# Patient Record
Sex: Female | Born: 1945 | Race: White | Hispanic: No | Marital: Married | Smoking: Former smoker
Health system: Southern US, Community
[De-identification: ages and names within clinical notes are randomized; demographics above are authoritative.]

## PROBLEM LIST (undated history)

## (undated) ENCOUNTER — Emergency Department (HOSPITAL_COMMUNITY): Discharge status: Absent without leave | Payer: PPO

## (undated) DIAGNOSIS — F0390 Unspecified dementia without behavioral disturbance: Secondary | ICD-10-CM

## (undated) DIAGNOSIS — K635 Polyp of colon: Secondary | ICD-10-CM

## (undated) DIAGNOSIS — F32A Depression, unspecified: Secondary | ICD-10-CM

## (undated) DIAGNOSIS — R7301 Impaired fasting glucose: Secondary | ICD-10-CM

## (undated) DIAGNOSIS — I10 Essential (primary) hypertension: Secondary | ICD-10-CM

## (undated) DIAGNOSIS — F329 Major depressive disorder, single episode, unspecified: Secondary | ICD-10-CM

## (undated) DIAGNOSIS — F419 Anxiety disorder, unspecified: Secondary | ICD-10-CM

## (undated) DIAGNOSIS — J449 Chronic obstructive pulmonary disease, unspecified: Secondary | ICD-10-CM

## (undated) DIAGNOSIS — R413 Other amnesia: Secondary | ICD-10-CM

## (undated) DIAGNOSIS — G4730 Sleep apnea, unspecified: Secondary | ICD-10-CM

## (undated) DIAGNOSIS — E785 Hyperlipidemia, unspecified: Secondary | ICD-10-CM

## (undated) DIAGNOSIS — K5790 Diverticulosis of intestine, part unspecified, without perforation or abscess without bleeding: Secondary | ICD-10-CM

## (undated) DIAGNOSIS — J45909 Unspecified asthma, uncomplicated: Secondary | ICD-10-CM

## (undated) HISTORY — PX: ABDOMINAL HYSTERECTOMY: SHX81

## (undated) HISTORY — DX: Chronic obstructive pulmonary disease, unspecified: J44.9

## (undated) HISTORY — DX: Polyp of colon: K63.5

## (undated) HISTORY — DX: Sleep apnea, unspecified: G47.30

## (undated) HISTORY — DX: Diverticulosis of intestine, part unspecified, without perforation or abscess without bleeding: K57.90

## (undated) HISTORY — DX: Unspecified asthma, uncomplicated: J45.909

## (undated) HISTORY — PX: ABDOMINAL SURGERY: SHX537

## (undated) HISTORY — PX: APPENDECTOMY: SHX54

## (undated) HISTORY — PX: BACK SURGERY: SHX140

## (undated) HISTORY — PX: NECK SURGERY: SHX720

## (undated) HISTORY — PX: TONSILLECTOMY: SUR1361

## (undated) HISTORY — DX: Hyperlipidemia, unspecified: E78.5

## (undated) HISTORY — DX: Impaired fasting glucose: R73.01

## (undated) HISTORY — PX: SIGMOIDECTOMY: SHX176

## (undated) HISTORY — DX: Other amnesia: R41.3

---

## 1998-04-11 ENCOUNTER — Encounter: Payer: Self-pay | Admitting: Neurosurgery

## 1998-04-11 ENCOUNTER — Ambulatory Visit (HOSPITAL_COMMUNITY): Admission: RE | Admit: 1998-04-11 | Discharge: 1998-04-11 | Payer: Self-pay | Admitting: Neurosurgery

## 1998-04-25 ENCOUNTER — Ambulatory Visit (HOSPITAL_COMMUNITY): Admission: RE | Admit: 1998-04-25 | Discharge: 1998-04-25 | Payer: Self-pay | Admitting: Neurosurgery

## 1998-04-25 ENCOUNTER — Encounter: Payer: Self-pay | Admitting: Neurosurgery

## 1998-05-09 ENCOUNTER — Ambulatory Visit (HOSPITAL_COMMUNITY): Admission: RE | Admit: 1998-05-09 | Discharge: 1998-05-09 | Payer: Self-pay | Admitting: Neurosurgery

## 1998-05-09 ENCOUNTER — Encounter: Payer: Self-pay | Admitting: Neurosurgery

## 1998-06-14 ENCOUNTER — Encounter: Payer: Self-pay | Admitting: Neurosurgery

## 1998-06-18 ENCOUNTER — Inpatient Hospital Stay (HOSPITAL_COMMUNITY): Admission: RE | Admit: 1998-06-18 | Discharge: 1998-06-21 | Payer: Self-pay | Admitting: Neurosurgery

## 1998-06-18 ENCOUNTER — Encounter: Payer: Self-pay | Admitting: Neurosurgery

## 1998-11-08 ENCOUNTER — Ambulatory Visit (HOSPITAL_COMMUNITY): Admission: RE | Admit: 1998-11-08 | Discharge: 1998-11-08 | Payer: Self-pay | Admitting: Neurosurgery

## 1998-11-08 ENCOUNTER — Encounter: Payer: Self-pay | Admitting: Neurosurgery

## 1998-11-11 ENCOUNTER — Ambulatory Visit (HOSPITAL_COMMUNITY): Admission: RE | Admit: 1998-11-11 | Discharge: 1998-11-11 | Payer: Self-pay | Admitting: Neurosurgery

## 1998-11-11 ENCOUNTER — Encounter: Payer: Self-pay | Admitting: Neurosurgery

## 1998-11-22 ENCOUNTER — Encounter: Payer: Self-pay | Admitting: Neurosurgery

## 1998-11-22 ENCOUNTER — Inpatient Hospital Stay (HOSPITAL_COMMUNITY): Admission: RE | Admit: 1998-11-22 | Discharge: 1998-11-25 | Payer: Self-pay | Admitting: Neurosurgery

## 2000-03-07 ENCOUNTER — Ambulatory Visit (HOSPITAL_COMMUNITY): Admission: RE | Admit: 2000-03-07 | Discharge: 2000-03-07 | Payer: Self-pay | Admitting: Neurosurgery

## 2000-03-07 ENCOUNTER — Encounter: Payer: Self-pay | Admitting: Neurosurgery

## 2000-03-31 ENCOUNTER — Encounter: Payer: Self-pay | Admitting: Neurosurgery

## 2000-04-02 ENCOUNTER — Inpatient Hospital Stay (HOSPITAL_COMMUNITY): Admission: RE | Admit: 2000-04-02 | Discharge: 2000-04-06 | Payer: Self-pay | Admitting: Neurosurgery

## 2000-04-02 ENCOUNTER — Encounter: Payer: Self-pay | Admitting: Neurosurgery

## 2000-10-18 ENCOUNTER — Encounter: Payer: Self-pay | Admitting: Otolaryngology

## 2000-10-18 ENCOUNTER — Encounter: Admission: RE | Admit: 2000-10-18 | Discharge: 2000-10-18 | Payer: Self-pay | Admitting: Otolaryngology

## 2000-10-26 ENCOUNTER — Encounter (INDEPENDENT_AMBULATORY_CARE_PROVIDER_SITE_OTHER): Payer: Self-pay

## 2000-10-26 ENCOUNTER — Other Ambulatory Visit: Admission: RE | Admit: 2000-10-26 | Discharge: 2000-10-26 | Payer: Self-pay | Admitting: Otolaryngology

## 2001-04-25 ENCOUNTER — Encounter: Payer: Self-pay | Admitting: Family Medicine

## 2001-04-25 ENCOUNTER — Ambulatory Visit (HOSPITAL_COMMUNITY): Admission: RE | Admit: 2001-04-25 | Discharge: 2001-04-25 | Payer: Self-pay | Admitting: Family Medicine

## 2001-04-29 ENCOUNTER — Ambulatory Visit (HOSPITAL_COMMUNITY): Admission: RE | Admit: 2001-04-29 | Discharge: 2001-04-29 | Payer: Self-pay | Admitting: Neurosurgery

## 2001-04-29 ENCOUNTER — Encounter: Payer: Self-pay | Admitting: Neurosurgery

## 2001-09-07 ENCOUNTER — Ambulatory Visit (HOSPITAL_COMMUNITY): Admission: RE | Admit: 2001-09-07 | Discharge: 2001-09-07 | Payer: Self-pay | Admitting: Family Medicine

## 2001-09-07 ENCOUNTER — Encounter: Payer: Self-pay | Admitting: Family Medicine

## 2001-09-20 ENCOUNTER — Encounter: Payer: Self-pay | Admitting: Family Medicine

## 2001-09-20 ENCOUNTER — Ambulatory Visit (HOSPITAL_COMMUNITY): Admission: RE | Admit: 2001-09-20 | Discharge: 2001-09-20 | Payer: Self-pay | Admitting: Family Medicine

## 2002-02-09 ENCOUNTER — Ambulatory Visit (HOSPITAL_COMMUNITY): Admission: RE | Admit: 2002-02-09 | Discharge: 2002-02-09 | Payer: Self-pay | Admitting: Family Medicine

## 2002-02-09 ENCOUNTER — Encounter: Payer: Self-pay | Admitting: Family Medicine

## 2002-03-06 ENCOUNTER — Ambulatory Visit (HOSPITAL_COMMUNITY): Admission: RE | Admit: 2002-03-06 | Discharge: 2002-03-06 | Payer: Self-pay | Admitting: Family Medicine

## 2002-12-05 ENCOUNTER — Ambulatory Visit (HOSPITAL_COMMUNITY): Admission: RE | Admit: 2002-12-05 | Discharge: 2002-12-09 | Payer: Self-pay | Admitting: Neurosurgery

## 2002-12-05 ENCOUNTER — Encounter: Payer: Self-pay | Admitting: Neurosurgery

## 2003-07-06 ENCOUNTER — Ambulatory Visit (HOSPITAL_COMMUNITY): Admission: RE | Admit: 2003-07-06 | Discharge: 2003-07-06 | Payer: Self-pay | Admitting: Family Medicine

## 2004-08-23 ENCOUNTER — Ambulatory Visit: Payer: Self-pay | Admitting: Pulmonary Disease

## 2004-08-23 ENCOUNTER — Ambulatory Visit: Admission: RE | Admit: 2004-08-23 | Discharge: 2004-08-23 | Payer: Self-pay | Admitting: Family Medicine

## 2004-11-17 ENCOUNTER — Ambulatory Visit (HOSPITAL_COMMUNITY): Admission: RE | Admit: 2004-11-17 | Discharge: 2004-11-17 | Payer: Self-pay | Admitting: Internal Medicine

## 2004-11-17 ENCOUNTER — Ambulatory Visit: Payer: Self-pay | Admitting: Internal Medicine

## 2005-06-26 ENCOUNTER — Ambulatory Visit (HOSPITAL_COMMUNITY): Admission: RE | Admit: 2005-06-26 | Discharge: 2005-06-26 | Payer: Self-pay | Admitting: Family Medicine

## 2006-03-09 ENCOUNTER — Ambulatory Visit: Admission: RE | Admit: 2006-03-09 | Discharge: 2006-03-09 | Payer: Self-pay | Admitting: Neurosurgery

## 2006-05-17 HISTORY — PX: CERVICAL FUSION: SHX112

## 2006-05-21 ENCOUNTER — Inpatient Hospital Stay (HOSPITAL_COMMUNITY): Admission: RE | Admit: 2006-05-21 | Discharge: 2006-05-24 | Payer: Self-pay | Admitting: Neurosurgery

## 2007-04-14 ENCOUNTER — Ambulatory Visit (HOSPITAL_COMMUNITY): Admission: RE | Admit: 2007-04-14 | Discharge: 2007-04-14 | Payer: Self-pay | Admitting: Family Medicine

## 2007-08-31 ENCOUNTER — Emergency Department (HOSPITAL_COMMUNITY): Admission: EM | Admit: 2007-08-31 | Discharge: 2007-08-31 | Payer: Self-pay | Admitting: Emergency Medicine

## 2008-01-03 ENCOUNTER — Emergency Department (HOSPITAL_COMMUNITY): Admission: EM | Admit: 2008-01-03 | Discharge: 2008-01-03 | Payer: Self-pay | Admitting: Emergency Medicine

## 2008-04-24 ENCOUNTER — Ambulatory Visit (HOSPITAL_COMMUNITY): Admission: RE | Admit: 2008-04-24 | Discharge: 2008-04-24 | Payer: Self-pay | Admitting: Family Medicine

## 2008-06-01 ENCOUNTER — Ambulatory Visit: Admission: RE | Admit: 2008-06-01 | Discharge: 2008-06-01 | Payer: Self-pay | Admitting: Family Medicine

## 2008-08-15 ENCOUNTER — Ambulatory Visit (HOSPITAL_COMMUNITY): Admission: RE | Admit: 2008-08-15 | Discharge: 2008-08-15 | Payer: Self-pay | Admitting: Family Medicine

## 2008-08-23 ENCOUNTER — Ambulatory Visit: Payer: Self-pay | Admitting: Gastroenterology

## 2008-08-27 ENCOUNTER — Ambulatory Visit: Payer: Self-pay | Admitting: Gastroenterology

## 2008-08-27 ENCOUNTER — Encounter: Payer: Self-pay | Admitting: Gastroenterology

## 2008-08-27 ENCOUNTER — Ambulatory Visit (HOSPITAL_COMMUNITY): Admission: RE | Admit: 2008-08-27 | Discharge: 2008-08-27 | Payer: Self-pay | Admitting: Gastroenterology

## 2008-08-30 ENCOUNTER — Ambulatory Visit (HOSPITAL_COMMUNITY): Admission: RE | Admit: 2008-08-30 | Discharge: 2008-08-30 | Payer: Self-pay | Admitting: Obstetrics and Gynecology

## 2008-09-17 ENCOUNTER — Ambulatory Visit (HOSPITAL_COMMUNITY): Admission: RE | Admit: 2008-09-17 | Discharge: 2008-09-17 | Payer: Self-pay | Admitting: Obstetrics and Gynecology

## 2008-09-24 ENCOUNTER — Ambulatory Visit (HOSPITAL_COMMUNITY): Admission: RE | Admit: 2008-09-24 | Discharge: 2008-09-24 | Payer: Self-pay | Admitting: Family Medicine

## 2008-10-23 ENCOUNTER — Ambulatory Visit (HOSPITAL_COMMUNITY): Admission: RE | Admit: 2008-10-23 | Discharge: 2008-10-23 | Payer: Self-pay | Admitting: Family Medicine

## 2008-11-26 ENCOUNTER — Ambulatory Visit (HOSPITAL_COMMUNITY): Admission: RE | Admit: 2008-11-26 | Discharge: 2008-11-26 | Payer: Self-pay | Admitting: Obstetrics and Gynecology

## 2009-01-25 ENCOUNTER — Inpatient Hospital Stay (HOSPITAL_COMMUNITY): Admission: RE | Admit: 2009-01-25 | Discharge: 2009-01-31 | Payer: Self-pay | Admitting: General Surgery

## 2009-01-25 ENCOUNTER — Encounter (INDEPENDENT_AMBULATORY_CARE_PROVIDER_SITE_OTHER): Payer: Self-pay | Admitting: General Surgery

## 2009-01-30 ENCOUNTER — Encounter: Payer: Self-pay | Admitting: Gastroenterology

## 2009-10-10 ENCOUNTER — Ambulatory Visit (HOSPITAL_COMMUNITY): Admission: RE | Admit: 2009-10-10 | Discharge: 2009-10-10 | Payer: Self-pay | Admitting: Family Medicine

## 2010-02-28 ENCOUNTER — Ambulatory Visit (HOSPITAL_COMMUNITY): Admission: RE | Admit: 2010-02-28 | Discharge: 2010-02-28 | Payer: Self-pay | Admitting: Family Medicine

## 2010-09-07 ENCOUNTER — Encounter: Payer: Self-pay | Admitting: Family Medicine

## 2010-11-24 LAB — CBC
HCT: 34.3 % — ABNORMAL LOW (ref 36.0–46.0)
HCT: 34.4 % — ABNORMAL LOW (ref 36.0–46.0)
HCT: 34.7 % — ABNORMAL LOW (ref 36.0–46.0)
HCT: 35.4 % — ABNORMAL LOW (ref 36.0–46.0)
HCT: 36.3 % (ref 36.0–46.0)
HCT: 36.4 % (ref 36.0–46.0)
Hemoglobin: 11.7 g/dL — ABNORMAL LOW (ref 12.0–15.0)
Hemoglobin: 11.6 g/dL — ABNORMAL LOW (ref 12.0–15.0)
Hemoglobin: 11.9 g/dL — ABNORMAL LOW (ref 12.0–15.0)
Hemoglobin: 12.2 g/dL (ref 12.0–15.0)
Hemoglobin: 12.2 g/dL (ref 12.0–15.0)
Hemoglobin: 12.5 g/dL (ref 12.0–15.0)
MCHC: 34.3 g/dL (ref 30.0–36.0)
MCHC: 33.6 g/dL (ref 30.0–36.0)
MCHC: 33.7 g/dL (ref 30.0–36.0)
MCHC: 34.2 g/dL (ref 30.0–36.0)
MCHC: 34.3 g/dL (ref 30.0–36.0)
MCHC: 34.4 g/dL (ref 30.0–36.0)
MCV: 84.3 fL (ref 78.0–100.0)
MCV: 84.3 fL (ref 78.0–100.0)
MCV: 84.5 fL (ref 78.0–100.0)
MCV: 84.7 fL (ref 78.0–100.0)
MCV: 85.1 fL (ref 78.0–100.0)
MCV: 85.4 fL (ref 78.0–100.0)
Platelets: 649 K/uL — ABNORMAL HIGH (ref 150–400)
Platelets: 439 10*3/uL — ABNORMAL HIGH (ref 150–400)
Platelets: 496 10*3/uL — ABNORMAL HIGH (ref 150–400)
Platelets: 573 10*3/uL — ABNORMAL HIGH (ref 150–400)
Platelets: 598 10*3/uL — ABNORMAL HIGH (ref 150–400)
Platelets: 637 10*3/uL — ABNORMAL HIGH (ref 150–400)
RBC: 4.06 MIL/uL (ref 3.87–5.11)
RBC: 4.04 MIL/uL (ref 3.87–5.11)
RBC: 4.12 MIL/uL (ref 3.87–5.11)
RBC: 4.18 MIL/uL (ref 3.87–5.11)
RBC: 4.24 MIL/uL (ref 3.87–5.11)
RBC: 4.31 MIL/uL (ref 3.87–5.11)
RDW: 14 % (ref 11.5–15.5)
RDW: 13.6 % (ref 11.5–15.5)
RDW: 13.7 % (ref 11.5–15.5)
RDW: 13.8 % (ref 11.5–15.5)
RDW: 13.8 % (ref 11.5–15.5)
RDW: 13.9 % (ref 11.5–15.5)
WBC: 9.5 K/uL (ref 4.0–10.5)
WBC: 10.2 10*3/uL (ref 4.0–10.5)
WBC: 12.6 10*3/uL — ABNORMAL HIGH (ref 4.0–10.5)
WBC: 13.2 10*3/uL — ABNORMAL HIGH (ref 4.0–10.5)
WBC: 16.2 10*3/uL — ABNORMAL HIGH (ref 4.0–10.5)
WBC: 19.6 10*3/uL — ABNORMAL HIGH (ref 4.0–10.5)

## 2010-11-24 LAB — DIFFERENTIAL
Basophils Absolute: 0 10*3/uL (ref 0.0–0.1)
Basophils Absolute: 0 10*3/uL (ref 0.0–0.1)
Basophils Absolute: 0 K/uL (ref 0.0–0.1)
Basophils Absolute: 0.1 10*3/uL (ref 0.0–0.1)
Basophils Absolute: 0.2 10*3/uL — ABNORMAL HIGH (ref 0.0–0.1)
Basophils Relative: 1 % (ref 0–1)
Basophils Relative: 0 % (ref 0–1)
Basophils Relative: 0 % (ref 0–1)
Basophils Relative: 1 % (ref 0–1)
Basophils Relative: 2 % — ABNORMAL HIGH (ref 0–1)
Eosinophils Absolute: 0.2 10*3/uL (ref 0.0–0.7)
Eosinophils Absolute: 0 K/uL (ref 0.0–0.7)
Eosinophils Absolute: 0.1 10*3/uL (ref 0.0–0.7)
Eosinophils Absolute: 0.2 10*3/uL (ref 0.0–0.7)
Eosinophils Absolute: 0.2 10*3/uL (ref 0.0–0.7)
Eosinophils Relative: 3 % (ref 0–5)
Eosinophils Relative: 0 % (ref 0–5)
Eosinophils Relative: 1 % (ref 0–5)
Eosinophils Relative: 2 % (ref 0–5)
Eosinophils Relative: 2 % (ref 0–5)
Lymphocytes Relative: 18 % (ref 12–46)
Lymphocytes Relative: 10 % — ABNORMAL LOW (ref 12–46)
Lymphocytes Relative: 7 % — ABNORMAL LOW (ref 12–46)
Lymphocytes Relative: 8 % — ABNORMAL LOW (ref 12–46)
Lymphocytes Relative: 9 % — ABNORMAL LOW (ref 12–46)
Lymphs Abs: 1.7 10*3/uL (ref 0.7–4.0)
Lymphs Abs: 1 10*3/uL (ref 0.7–4.0)
Lymphs Abs: 1.1 10*3/uL (ref 0.7–4.0)
Lymphs Abs: 1.3 10*3/uL (ref 0.7–4.0)
Lymphs Abs: 1.3 K/uL (ref 0.7–4.0)
Monocytes Absolute: 0.7 10*3/uL (ref 0.1–1.0)
Monocytes Absolute: 0.5 10*3/uL (ref 0.1–1.0)
Monocytes Absolute: 0.6 10*3/uL (ref 0.1–1.0)
Monocytes Absolute: 0.8 10*3/uL (ref 0.1–1.0)
Monocytes Absolute: 1.1 K/uL — ABNORMAL HIGH (ref 0.1–1.0)
Monocytes Relative: 7 % (ref 3–12)
Monocytes Relative: 4 % (ref 3–12)
Monocytes Relative: 5 % (ref 3–12)
Monocytes Relative: 5 % (ref 3–12)
Monocytes Relative: 6 % (ref 3–12)
Neutro Abs: 6.8 10*3/uL (ref 1.7–7.7)
Neutro Abs: 10.6 10*3/uL — ABNORMAL HIGH (ref 1.7–7.7)
Neutro Abs: 14 10*3/uL — ABNORMAL HIGH (ref 1.7–7.7)
Neutro Abs: 17.2 K/uL — ABNORMAL HIGH (ref 1.7–7.7)
Neutro Abs: 8.3 10*3/uL — ABNORMAL HIGH (ref 1.7–7.7)
Neutrophils Relative %: 72 % (ref 43–77)
Neutrophils Relative %: 82 % — ABNORMAL HIGH (ref 43–77)
Neutrophils Relative %: 84 % — ABNORMAL HIGH (ref 43–77)
Neutrophils Relative %: 86 % — ABNORMAL HIGH (ref 43–77)
Neutrophils Relative %: 88 % — ABNORMAL HIGH (ref 43–77)

## 2010-11-24 LAB — BASIC METABOLIC PANEL
BUN: 3 mg/dL — ABNORMAL LOW (ref 6–23)
BUN: 2 mg/dL — ABNORMAL LOW (ref 6–23)
BUN: 3 mg/dL — ABNORMAL LOW (ref 6–23)
BUN: 3 mg/dL — ABNORMAL LOW (ref 6–23)
BUN: 8 mg/dL (ref 6–23)
CO2: 36 mEq/L — ABNORMAL HIGH (ref 19–32)
CO2: 34 mEq/L — ABNORMAL HIGH (ref 19–32)
CO2: 37 mEq/L — ABNORMAL HIGH (ref 19–32)
CO2: 37 mEq/L — ABNORMAL HIGH (ref 19–32)
CO2: 38 mEq/L — ABNORMAL HIGH (ref 19–32)
Calcium: 8.9 mg/dL (ref 8.4–10.5)
Calcium: 8.7 mg/dL (ref 8.4–10.5)
Calcium: 8.8 mg/dL (ref 8.4–10.5)
Calcium: 9 mg/dL (ref 8.4–10.5)
Calcium: 9.4 mg/dL (ref 8.4–10.5)
Chloride: 104 mEq/L (ref 96–112)
Chloride: 93 mEq/L — ABNORMAL LOW (ref 96–112)
Chloride: 98 mEq/L (ref 96–112)
Chloride: 98 mEq/L (ref 96–112)
Chloride: 99 mEq/L (ref 96–112)
Creatinine, Ser: 0.5 mg/dL (ref 0.4–1.2)
Creatinine, Ser: 0.43 mg/dL (ref 0.4–1.2)
Creatinine, Ser: 0.48 mg/dL (ref 0.4–1.2)
Creatinine, Ser: 0.48 mg/dL (ref 0.4–1.2)
Creatinine, Ser: 0.57 mg/dL (ref 0.4–1.2)
GFR calc Af Amer: >60 mL/min (ref >60)
GFR calc Af Amer: >60 mL/min (ref >60)
GFR calc Af Amer: >60 mL/min (ref >60)
GFR calc Af Amer: >60 mL/min (ref >60)
GFR calc Af Amer: >60 mL/min (ref >60)
GFR calc non Af Amer: >60 mL/min (ref >60)
GFR calc non Af Amer: >60 mL/min (ref >60)
GFR calc non Af Amer: >60 mL/min (ref >60)
GFR calc non Af Amer: >60 mL/min (ref >60)
GFR calc non Af Amer: >60 mL/min (ref >60)
Glucose, Bld: 99 mg/dL (ref 70–99)
Glucose, Bld: 133 mg/dL — ABNORMAL HIGH (ref 70–99)
Glucose, Bld: 136 mg/dL — ABNORMAL HIGH (ref 70–99)
Glucose, Bld: 145 mg/dL — ABNORMAL HIGH (ref 70–99)
Glucose, Bld: 96 mg/dL (ref 70–99)
Potassium: 3.9 mEq/L (ref 3.5–5.1)
Potassium: 3.4 mEq/L — ABNORMAL LOW (ref 3.5–5.1)
Potassium: 3.7 mEq/L (ref 3.5–5.1)
Potassium: 4.2 mEq/L (ref 3.5–5.1)
Potassium: 4.4 mEq/L (ref 3.5–5.1)
Sodium: 143 mEq/L (ref 135–145)
Sodium: 135 mEq/L (ref 135–145)
Sodium: 138 mEq/L (ref 135–145)
Sodium: 139 mEq/L (ref 135–145)
Sodium: 142 mEq/L (ref 135–145)

## 2010-11-24 LAB — BASIC METABOLIC PANEL WITH GFR
BUN: 5 mg/dL — ABNORMAL LOW (ref 6–23)
CO2: 36 meq/L — ABNORMAL HIGH (ref 19–32)
Calcium: 8.6 mg/dL (ref 8.4–10.5)
Chloride: 98 meq/L (ref 96–112)
Creatinine, Ser: 0.45 mg/dL (ref 0.4–1.2)
GFR calc non Af Amer: >60 mL/min
Glucose, Bld: 151 mg/dL — ABNORMAL HIGH (ref 70–99)
Potassium: 3.4 meq/L — ABNORMAL LOW (ref 3.5–5.1)
Sodium: 138 meq/L (ref 135–145)

## 2010-11-24 LAB — ABO/RH: ABO/RH(D): A NEG

## 2010-11-24 LAB — PHOSPHORUS
Phosphorus: 4.2 mg/dL (ref 2.3–4.6)
Phosphorus: 2.7 mg/dL (ref 2.3–4.6)
Phosphorus: 2.8 mg/dL (ref 2.3–4.6)
Phosphorus: 3.3 mg/dL (ref 2.3–4.6)
Phosphorus: 3.8 mg/dL (ref 2.3–4.6)

## 2010-11-24 LAB — MAGNESIUM
Magnesium: 1.9 mg/dL (ref 1.5–2.5)
Magnesium: 1.8 mg/dL (ref 1.5–2.5)
Magnesium: 1.9 mg/dL (ref 1.5–2.5)
Magnesium: 2 mg/dL (ref 1.5–2.5)
Magnesium: 2 mg/dL (ref 1.5–2.5)

## 2010-11-26 LAB — CBC
HCT: 39.6 % (ref 36.0–46.0)
Hemoglobin: 13.5 g/dL (ref 12.0–15.0)
MCHC: 34.1 g/dL (ref 30.0–36.0)
MCV: 84.5 fL (ref 78.0–100.0)
Platelets: 282 10*3/uL (ref 150–400)
RBC: 4.69 MIL/uL (ref 3.87–5.11)
RDW: 14.3 % (ref 11.5–15.5)
WBC: 6.3 10*3/uL (ref 4.0–10.5)

## 2010-11-26 LAB — COMPREHENSIVE METABOLIC PANEL
ALT: 17 U/L (ref 0–35)
AST: 17 U/L (ref 0–37)
Albumin: 4.1 g/dL (ref 3.5–5.2)
Alkaline Phosphatase: 72 U/L (ref 39–117)
BUN: 18 mg/dL (ref 6–23)
CO2: 35 mEq/L — ABNORMAL HIGH (ref 19–32)
Calcium: 9.7 mg/dL (ref 8.4–10.5)
Chloride: 98 mEq/L (ref 96–112)
Creatinine, Ser: 0.55 mg/dL (ref 0.4–1.2)
GFR calc Af Amer: >60 mL/min (ref >60)
GFR calc non Af Amer: >60 mL/min (ref >60)
Glucose, Bld: 89 mg/dL (ref 70–99)
Potassium: 3.9 mEq/L (ref 3.5–5.1)
Sodium: 140 mEq/L (ref 135–145)
Total Bilirubin: 0.7 mg/dL (ref 0.3–1.2)
Total Protein: 6.4 g/dL (ref 6.0–8.3)

## 2010-11-27 ENCOUNTER — Emergency Department (HOSPITAL_COMMUNITY)
Admission: EM | Admit: 2010-11-27 | Discharge: 2010-11-27 | Disposition: A | Discharge status: Home / Self Care | Payer: 59 | Attending: Emergency Medicine | Admitting: Emergency Medicine

## 2010-11-27 ENCOUNTER — Emergency Department (HOSPITAL_COMMUNITY): Payer: 59

## 2010-11-27 DIAGNOSIS — S60219A Contusion of unspecified wrist, initial encounter: Secondary | ICD-10-CM | POA: Insufficient documentation

## 2010-11-27 DIAGNOSIS — W208XXA Other cause of strike by thrown, projected or falling object, initial encounter: Secondary | ICD-10-CM | POA: Insufficient documentation

## 2010-11-27 DIAGNOSIS — M65839 Other synovitis and tenosynovitis, unspecified forearm: Secondary | ICD-10-CM | POA: Insufficient documentation

## 2010-11-27 DIAGNOSIS — M25539 Pain in unspecified wrist: Secondary | ICD-10-CM | POA: Insufficient documentation

## 2010-11-27 DIAGNOSIS — Y9229 Other specified public building as the place of occurrence of the external cause: Secondary | ICD-10-CM | POA: Insufficient documentation

## 2010-11-27 LAB — BLOOD GAS, ARTERIAL
Acid-Base Excess: 5 mmol/L — ABNORMAL HIGH (ref 0.0–2.0)
Bicarbonate: 29 mEq/L — ABNORMAL HIGH (ref 20.0–24.0)
FIO2: 21 %
O2 Saturation: 92.8 %
Patient temperature: 37
TCO2: 25.3 mmol/L (ref 0–100)
pCO2 arterial: 42.3 mmHg (ref 35.0–45.0)
pH, Arterial: 7.45 — ABNORMAL HIGH (ref 7.350–7.400)
pO2, Arterial: 63.8 mmHg — ABNORMAL LOW (ref 80.0–100.0)

## 2010-12-01 LAB — COMPREHENSIVE METABOLIC PANEL
ALT: 17 U/L (ref 0–35)
AST: 19 U/L (ref 0–37)
Albumin: 4.1 g/dL (ref 3.5–5.2)
Alkaline Phosphatase: 78 U/L (ref 39–117)
BUN: 12 mg/dL (ref 6–23)
CO2: 33 mEq/L — ABNORMAL HIGH (ref 19–32)
Calcium: 9.9 mg/dL (ref 8.4–10.5)
Chloride: 102 mEq/L (ref 96–112)
Creatinine, Ser: 0.53 mg/dL (ref 0.4–1.2)
GFR calc Af Amer: >60 mL/min (ref >60)
GFR calc non Af Amer: >60 mL/min (ref >60)
Glucose, Bld: 121 mg/dL — ABNORMAL HIGH (ref 70–99)
Potassium: 3.9 mEq/L (ref 3.5–5.1)
Sodium: 141 mEq/L (ref 135–145)
Total Bilirubin: 0.6 mg/dL (ref 0.3–1.2)
Total Protein: 6.6 g/dL (ref 6.0–8.3)

## 2010-12-01 LAB — CBC
HCT: 40 % (ref 36.0–46.0)
Hemoglobin: 13.4 g/dL (ref 12.0–15.0)
MCHC: 33.5 g/dL (ref 30.0–36.0)
MCV: 85.2 fL (ref 78.0–100.0)
Platelets: 287 10*3/uL (ref 150–400)
RBC: 4.7 MIL/uL (ref 3.87–5.11)
RDW: 13.6 % (ref 11.5–15.5)
WBC: 6.4 10*3/uL (ref 4.0–10.5)

## 2010-12-30 NOTE — H&P (Signed)
NAME:  Nichole [name], Nichole [name] NO.:  [id number]   MEDICAL RECORD NO.:  [id number]         PATIENT TYPE:  PAMB   LOCATION:  DAY                           FACILITY:  [facility]   PHYSICIAN:  Tilda Burrow, M.D. DATE OF BIRTH:  1946/01/08   DATE OF ADMISSION:  DATE OF DISCHARGE:  LH                              HISTORY & PHYSICAL   CHIEF COMPLAINT:  Left lower quadrant pain, suspected associated with  left tube and ovary.   HISTORY OF PRESENT ILLNESS:  This 65 year old postmenopausal female,  many years status post vaginal hysterectomy in 1978, is admitted at this  time for laparoscopic assessment of left lower quadrant pain and  fullness, felt to represent a residual left ovary.  She had a  longstanding history of left lower quadrant discomfort with progression  over the past few weeks.  She has had dyspareunia associated with left  lower quadrant for many years with transvaginal hysterectomy performed  in 1978.  She has had no knowledge as to whether the tubes and ovaries  were removed with records not currently available.  Transvaginal  ultrasound confirms the uterus is absent, right ovary is seen on CT scan  performed earlier this year and ultrasound now shows a soft tissue mass  consistent with left ovary measured 3.6 x 2.3 x 2.1 cm, in the area just  above the vaginal cuff on the left side which seems to reproduce her  discomfort.  CA-125 was ordered and was completely reassuring at 4.5.  There is no ascites and no mention of adenopathy.  She has had a  colonoscopy by Dr. Cira Servant, which was negative for bowel abnormalities.  She has a medical history that is positive for sigmoid diverticulosis,  but no episodes of diverticulitis.  Medical history also positive for  asthma, hypertension, and degenerative disk disease.   SURGICAL HISTORY:  Positive for 5 back surgeries as well as a cervical  fusion, for which the patient is on disability.  She has distant history  of  tonsillectomy and appendectomy and vaginal hysterectomy in 1978.   ALLERGIES:  IODINE which causes shock and MORPHINE causing vomiting.   MEDICINES:  1. Fluid pill.  2. HCTZ.  3. Albuterol inhaler p.r.n. asthma.  4. Xanax 1-2 tablets daily.  5. Cardura 2 mg daily.  6. Cardizem CD 240 mg daily.  7. Lexapro 220 mg daily x2 years.  8. Lorcet 10/650 is taken 1-3 tablets daily as needed.  9. Flovent and Flonase are used for asthma.  10.She takes vitamins including calcium.  11.She has been on aspirin until recently, discontinued 5 days prior      to surgery.  12.Stool softeners p.r.n.   FAMILY HISTORY:  Essentially negative.   SOCIAL HISTORY:  Married, husband with diabetes; smoker until 11 years  ago; nonsmoker, nondrinker at this time.  She had no complications from  her recent colonoscopy.   PHYSICAL EXAMINATION:  GENERAL:  A healthy-appearing, moderately obese  Caucasian female.  VITAL SIGNS:  Weight 205 and blood pressure 140/60.   Recent December labs include hemoglobin 13, platelets 371, BUN 14,  creatinine 0.5, and white count 7900.  CA-125 is normal at 4.5 as  described early, height is 5 feet 2-1/2 inches.   IMPRESSION:  Left lower quadrant discomfort associated with residual  left ovary.   PLAN:  Laparoscopic bilateral salpingo-oophorectomy, possibly requiring  laparotomy depending on the findings at surgery, to be performed on  Monday, September 17, 2008.   Addendum:  Patient Did NOT stop Aspirin as previously thought, so  surgery will be delayed 2 weeks and ASA stopped 10 days preoperatively.      Tilda Burrow, M.D.  Electronically Signed     JVF/MEDQ  D:  09/14/2008  T:  09/15/2008  Job:  [id number]   cc:   Donna Bernard, M.D.  Fax: [phone]   Kassie Mends, M.D.  [address]  [place] , [place] [address]

## 2010-12-30 NOTE — Op Note (Signed)
NAMEDUNG, PRIEN NO.:  [id number]   MEDICAL RECORD NO.:  [id number]          PATIENT TYPE:  INP   LOCATION:  A332                          FACILITY:  [facility]   PHYSICIAN:  Tilford Pillar, MD      DATE OF BIRTH:  05/31/1946   DATE OF PROCEDURE:  01/25/2009  DATE OF DISCHARGE:                               OPERATIVE REPORT   PREOPERATIVE DIAGNOSES:  1. Chronic sigmoid diverticulitis.  2. Left lower quadrant pain.   POSTOPERATIVE DIAGNOSES:  1. Chronic sigmoid diverticulitis.  2. Left lower quadrant pain.   PROCEDURE:  Laparoscopic hand-assisted left sigmoidectomy and  oophorectomy with stapled low pelvic anastomosis and intraabdominal  drain placement.   SURGEON:  Tilford Pillar, MD   ANESTHESIA:  General endotracheal.   LOCAL ANESTHETICS:  None.   SPECIMENS:  Sigmoid colon and left ovary.   ESTIMATED BLOOD LOSS:  250 mL.   INDICATIONS:  The patient is a 65 year old female who is referred to my  office after evaluation by Dr. Emelda Fear with a diagnostic laparoscopy.  She had had a history of left lower quadrant abdominal pain and there  was suspicion if this was ovarian in etiology.  During her diagnostic  laparoscopy, it was demonstrated that the left ovary was attenuated with  a significant amount of adhesions and was merely encapsulated by an  inflammatory mass around the sigmoid colon, which clearly appeared  inflamed.  Based on this, it was suspected that the patient had a  history of chronic diverticulitis as the more likely etiology of her  symptomatology.  She was therefore continued on antibiotic and was  referred to my office.  I did discuss the risks, benefits, and  alternatives of a laparoscopic possible open sigmoid colectomy.  I would  likely need to remove the left ovary depending on how involved the ovary  is and whether the ovary's vascular supply was with the inflammatory  mass.  She did understand the risks, benefits, and  alternatives  including but not limited to the risk of bleeding, infection,  anastomotic leak, bowel injury as well as the possibility of  intraoperative cardiac and pulmonary events.  Her questions and concerns  were addressed.  The patient consented to planned procedure.   OPERATION:  The patient was taken to the operating room, was placed in  supine position on the operating room table, at which time the general  anesthetic was administered.  Once the patient was asleep, a Foley  catheter was placed in standard sterile fashion by the operating room  staff.  Her abdomen was then prepped with Betadine solution and draped  in a standard fashion.  At this time, I did create a stab incision at  the Palmer's point in the left subcostal margin.  Veress needle was  inserted.  Saline drop test was utilized to confirm the intraperitoneal  placement.  The pneumoperitoneum was initiated.  Once the  pneumoperitoneum was obtained, an 11-mm trocar was inserted in the  epigastric stab incision.  The trocar was advanced over laparoscope  allowing visualization of the trocar entering into the peritoneal  cavity.  At this time, the inner cannula was withdrawn, the scope was  reinserted.  There was no evidence of any Veress or trocar placement  injury.  The Veress needle was removed at this time and I placed a  trocar in the right lower quadrant, demonstrate stab incisions and 11-mm  trocar placed under direct visualization and then I created a small  midline incision, a right lateral key-hole defect around the umbilicus  with scalpel for the HandPort.  Dissection in the subcuticular tissue  was carried out using electrocautery.  Upon entering into the peritoneal  cavity, the pneumoperitoneum was decompressed.  The HandPort was  enlarged adequately to fit my hand and hand-port apparatus was inserted  in standard fashion.  Pneumoperitoneum was reestablished.  At this time,  I began palpation of the  pelvis.   There was an extreme amount of inflammatory tissue and a thickness in  the area.  The descending colon was identified but quickly became lost  within the thickened phlegmon in the left lower quadrant along the left  abdominal wall down into the pelvis.  Due to the patient's prior  hysterectomy and presence of significant amount of adhesions it was not  possible to identify the rectum distally.  At this time, I did begin  mobilization of the descending colon.  I sufficiently mobilized the  splenic flexure to get this away from the  the descending colon and then  continued my dissection distally __________ carried via sigmoid  phlegmon.  We carried out the dissection with a combination of a  Harmonic scalpel dissection as well as gentle digital dissection that  did enter into two separate encapsulated pockets of a purulent  discharge.  Discharge was aspirated.  The area was irrigated using a  suction irrigator.  I continued the dissection and was able to identify  the left ovary, which was intermittently adherent to the sigmoid colon.  I attempted to identify the tissue plane between the sigmoid colon and  the left ovary but was unable too.  During this, I noticed that I  disrupted the blood supply of the ovary and we needed to proceed with a  oophorectomy, not to take the ovary out en bloc with the sigmoid colon.  With tedious dissection, I was able to continue to free the sigmoid  colon.  I continued this down distally.  So I did enter into the pelvis.  Upon entrance finally and freeing up the sigmoid colon from the pelvis,  I did enter into a free stage and was able to identify the rectum, which  appeared normal in consistency.  At this time, hemostasis was excellent.  We then opted to proceed with __________ sigmoid resection.   We created a small defect in the rectal mesentery with the Harmonic  scalpel.  I then digitally enlarged this adequately enough to pass the   reticulating Endo-GIA stapler with 45 gray load on it.  Due to the size  of the rectum, I did fire passage of 3 separate staple firings to  completely transect the rectum distally.  Upon __________ ligature to  divide the mesocolon proximally staying close to the inflammatory  phlegmon near the sigmoid colon to ensure that there is no entry to  ureter.  Upon reaching the descending colon, at this time, I did pull  the specimen through the HandPort and decompressed the pneumoperitoneum.   On inspecting the descending colon, this appears healthy and viable with  good blood supply.  I did find an area which would easily reach the  pelvic brim and identified an area to be divided __________ I used a  Kelly clamp to clamp proximally the bowel at the point of transection  and then used an automatic purse-stringer to place the purse-string  suture, then transected the bowel at this point.  The freed specimens  including the sigmoid colon as well as the left ovary were placed on the  back table and were sent as permanent specimen to Pathology.  At this  time, hemostasis was good and I opened the sigmoid colon, placed a 29  anvil of the EEA stapler in the lumen, the pursestring suture was  secured and the descending colon was placed back into the abdominal  cavity.  HandPort was replaced and pneumoperitoneum was reestablished.  At this time, I did proceed from below to proceed with the transanal  portion of the procedure.  I advanced serial EEA sizers to dilate the  anus and rectum and was able to advance a 29 EEA stapler.  With nursing  assistance from above, the stapler was guided to the staple line of the  rectum and then an EEA stapling spike was deployed placing this through  the previous staple line.  Then the  anvil was connected to the spike,  ensuring that colon had not twisted upon itself, I then deployed the GIA  stapler in standard fashion.  Upon removal of the EEA stapler, I then   proceeded with rigid proctoscopy.  The pelvis was filled with sterile  saline and again having nursing assistance, I digitally clamped the  descending colon and bowel was insufflated with air.  There was noted to  be some bubbles in the fluid indicating a small air leak.  At this time,  I completed rigid proctoscopy, gloves and gowns were changed at this  time and I returned back to the surgical site.  Keeping the abdomen  decompressed, a Richardson retractor was utilized to retract the  HandPort inferiorly.  This did allow me adequate exposure down into the  pelvis and then I was able to identify the staple line. On inspection,  there was a small area along  the left posterior aspect that appeared to  have an incomplete staple firing, likely due to the thickness of the  tissue in this area.  I opted to reinforce and close this with 3-0 silk  suture in simple interrupted fashion.  I did continue this  circumferentially and then __________ single layer hand-sewn closure,  reinforcing the EEA staple line.  Reinspection revealed no evidence of  previous air leak and again, at this time, I irrigated the abdominal  cavity and pelvis with significant amount of  sterile saline.  Pelvis  and abdominal cavity had a final irrigation with gentamicin and sterile  saline was utilized.  This __________ gentle manipulation of the bowel  __________ and aspirated the fluid.  A thin, flat JP drain was placed to  the left lower quadrant through a stab incision.  The drain was placed  near the anastomosis as well as down into the pelvis.  At this time, all  trocars were removed, HandPort was removed.  The HandPort incision was  reapproximated with a few #0 looped Novafil sutures.  The #0 looped  Novafil suture sutures were secured to each other and packed underneath  the __________ Subcutaneous tissue was irrigated and skin staples were  utilized to reapproximate the skin at the trocar site as  well as the   HandPort site.  The JP was drain was secured with a 3-0 nylon suture, 4  x 4 dressings were placed.  The drapes were removed.  The dressings were  secured.  The patient was allowed to come out of general anesthetic.  She was transferred to the postanesthetic care unit in a stable  condition.  At the conclusion of procedure, all instrument, sponge, and  needle counts were correct.  The patient tolerated the procedure  extremely well.      Tilford Pillar, MD  Electronically Signed     BZ/MEDQ  D:  01/26/2009  T:  01/27/2009  Job:  [id number]   cc:   Tilda Burrow, M.D.  Fax: [phone]   Donna Bernard, M.D.  Fax: [phone]

## 2010-12-30 NOTE — Op Note (Signed)
Nichole [name], Nichole [name]               ACCOUNT NO.:  [id number]   MEDICAL RECORD NO.:  [id number]          PATIENT TYPE:  AMB   LOCATION:  DAY                           FACILITY:  [facility]   PHYSICIAN:  Kassie Mends, M.D.      DATE OF BIRTH:  04/06/1946   DATE OF PROCEDURE:  08/27/2008  DATE OF DISCHARGE:                               OPERATIVE REPORT   REFERRING PHYSICIAN:  Lesli Albee, M.D.   PROCEDURE:  Colonoscopy with cold forceps polypectomy.   INDICATION FOR EXAM:  Ms. Lopez is a 65 year old female who last had a  colonoscopy in 2006.  She presented with abdominal pain and was found to  have a 3 x 3.6 lymph mass proximal to the sigmoid colon.  She presents  for evaluation.   FINDINGS:  1. Multiple diverticula seen in the descending and sigmoid colon.  No      mass seen in the sigmoid colon.  2. Two rectal polyps removed via cold forceps.  The polyps were 3 mm      to 4 mm each.  They were sessile.  Otherwise no masses,      inflammatory changes or AVMs seen.  3. Small internal hemorrhoids.  Otherwise normal retroflexed view of      the rectum.   DIAGNOSES:  1. No evidence for sigmoid colon cancer on colonoscopy.  2. Moderate diverticulosis.  3. Small internal hemorrhoids.   RECOMMENDATIONS:  1. She has an appointment with Dr. Emelda Fear as soon as possible      regarding her pelvic mass.  2. She should follow a high fiber diet.  She was given a handout on a      high fiber diet, diverticulosis and hemorrhoids.  3. No aspirin, NSAIDs or anticoagulation for 5 days.   MEDICATIONS:  1. Demerol 75 mg IV.  2. Versed 4 mg IV.  3. Phenergan 25 mg IV.   PROCEDURE TECHNIQUE:  Physical exam was performed.  Informed consent was  obtained from the patient after explaining the benefits, risks and  alternatives to the procedure.  The patient was connected to the monitor  and placed in the left lateral position.  Continuous oxygen was provided  by nasal cannula and IV medicine  administered through an indwelling  cannula.  After administration of sedation and rectal exam, the  patient's rectum was intubated and the scope was advanced under direct  visualization to the cecum.  The scope was removed slowly by carefully  examining the color, texture, anatomy and integrity of the mucosa on the  way out.  The patient was recovered in endoscopy and discharged home in  satisfactory condition.   PATH:  Simple adenoma. TCS in 2016. High fiber diet.      Kassie Mends, M.D.  Electronically Signed     SM/MEDQ  D:  08/27/2008  T:  08/27/2008  Job:  [id number]   cc:   Lesli Albee  Fax: [phone]   Tilda Burrow, M.D.  Fax: [phone]

## 2010-12-30 NOTE — H&P (Signed)
Nichole [name], Nichole [name]               ACCOUNT NO.:  [id number]   MEDICAL RECORD NO.:  [id number]          PATIENT TYPE:  AMB   LOCATION:  DAY                           FACILITY:  [facility]   PHYSICIAN:  Tilda Burrow, M.D. DATE OF BIRTH:  1946-08-12   DATE OF ADMISSION:  11/26/2008  DATE OF DISCHARGE:  LH                              HISTORY & PHYSICAL   CHIEF COMPLAINT:  Left lower quadrant pain, suspect associated with left  tube and ovary, scheduled for laparoscopic bilateral salpingo-  oophorectomy on Monday, November 26, 2008.   Nichole [name] is a 65 year old postmenopausal female who is being  scheduled for laparoscopic removal of both tubes and ovaries in order to  address left lower quadrant pain and fullness,  failed to represent  residual left ovary.  The reader is referred to the history from her  previously scheduled surgery on September 14, 2008, which was discontinued  due to the fact that she had continued to take her aspirin and therefore  the surgery was postponed until later date.  At this time, she has been  off her aspirin for greater than 10 days and there has been no change in  her overall condition.  As stated in the previous history that she has a  very reassuring CA-125 level.  She has had a colonoscopy performed  recently during January due to the fullness noted by Dr. Fletcher Anon  original workup.  Plans are to proceed with laparoscopic removal of both  tubes and ovaries on November 26, 2008.   ADDENDUM:  The patient is fully aware that while benign nature of the  mass is suspected, the potential for malignancy is not completely ruled  out until final pathology reports return.  The patient is aware that  laparotomy may be necessary if adhesions are encountered or technical  difficulty removing the ovary from the vaginal cuff.  Additionally  should malignancy be found on final frozen section, additional work  could be necessary at that point as well including a  referral for  chemotherapy of pelvic cancer treatment.      Tilda Burrow, M.D.  Electronically Signed     JVF/MEDQ  D:  11/23/2008  T:  11/24/2008  Job:  [id number]   cc:   [facility]

## 2010-12-30 NOTE — H&P (Signed)
Nichole [name], Nichole [name]               ACCOUNT NO.:  [id number]   MEDICAL RECORD NO.:  [id number]          PATIENT TYPE:  AMB   LOCATION:  DAY                           FACILITY:  [facility]   PHYSICIAN:  Kassie Mends, M.D.      DATE OF BIRTH:  April 24, 1946   DATE OF ADMISSION:  DATE OF DISCHARGE:  LH                              HISTORY & PHYSICAL   REFERRING PHYSICIAN:  Donna Bernard, M.D.   REASON FOR CONSULTATION:  Possible sigmoid colon mass.   HISTORY OF PRESENT ILLNESS:  Nichole [name] is a 65 year old female who was  complaining of left lower quadrant pain for the last several months.  Over the last 6-7 weeks, this symptoms got worse, and so she saw Dr.  Gerda Diss.  He ordered a CT scan.  The CT scan showed a soft tissue density  identified adjacent to and continues with the proximal sigmoid colon.  It was 3 x 2.9 cm and extended 3.6 cm in length.  There was no  intervening fat plane between the lesion in the sigmoid colon.  It was  felt that it represented colonic neoplasm or a confluent left ovary.  She had a screening colonoscopy by Dr. Jena Gauss in 2006.  The prep was  adequate.  She was noted to have sigmoid colon diverticulosis.  The CT  scan in December 2009 also noted extensive sigmoid diverticular disease.  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  She reports that both  her mother, her sister, and her grandmother had ovarian cancer.  She is  under the impression that she does not have an ovary.   She has bowel movement every day with stool softener.  She denies any  blood in her stool or black stool.  She describes her appetite is so-so.  She has not had any weight loss.  She denies any heartburn or  indigestion or problems swallowing.  She has not had any diarrhea or  fever.   PAST MEDICAL HISTORY:  1. Asthma.  2. Hypertension.  3. Degenerative disk disease.   PAST SURGICAL HISTORY:  1.  Appendectomy.  2. Tonsillectomy.  3. Five back surgeries and cervical fusion.   ALLERGIES:  IODINE (shock) and MORPHINE vomiting.   MEDICATIONS:  1. HCTZ.  2. Albuterol  3. Xanax 1 mg one to two daily.  4. Cardura 2 mg daily.  5. Cardizem CD 240 mg daily.  6. Lexapro 20 mg daily for the last 2-3 years.  7. Lorcet 10/650 half to one three times a day.  8. Flovent.  9. Flonase.  10.Calcium 600 plus b.i.d.  11.Aspirin 81 mg daily.  12.Multivitamin daily.  13  Stool softeners daily.   FAMILY HISTORY:  She denies any family history of colon cancer or colon  polyps.   SOCIAL HISTORY:  She is married and her husband has diabetes.  She  reports being disabled from her back surgeries and neck surgery.  She  quit smoking 11 years ago.  She denies any alcohol use.   REVIEW  OF SYSTEMS:  She states she has adequate sedation with the last  colonoscopy in 2006.  She received Demerol 100 mg IV and Versed 4 mg IV.   PHYSICAL EXAM:  Afebrile, hemodynamically stable. GEN: NAD, alert &  oriented X4. HEENT: atraumatic, normocephalic, PERRLA, mouth no oral  lesions, posterio pharynx without erythema or exudate. NECK: FROM, no  lymphadenopathy. LUNGS: clear to asuculattion bilaterally,  CARDIOVASCULAR: regular rhythm, no murmur. ABDOMEN: bowel sound  spresent, nontender, nondistended, unable to appreciate a mass.  EXTREMITIES: No edema or cyanosis. NEURO: no deficits.   LABORATORY DATA:  On December 2009, white count 7.9, hemoglobin 13.6,  platelet count 371, BUN 14, creatinine 0.52, AST 16, ALT 15, albumin  4.2, total bilirubin 0.4, and lipase 10.  H. pylori serology 0.5.   ASSESSMENT:  Nichole [name] is a 65 year old female who has a lesion near  sigmoid colon.  The differential diagnosis includes low likelihood of  colon cancer, ovarian cancer, or abscess.  Thank you for allowing me to see Nichole [name] in consultation.  My  recommendations follow.   RECOMMENDATIONS:  1. Will schedule for a  colonoscopy on Monday with a TriLyte bowel      prep.  2. If no etiology for this pelvic mass can be identified, then she      will be referred to OB/GYN.  3. She will be scheduled in follow up after the results of the      colonoscopy are known.      Kassie Mends, M.D.  Electronically Signed     SM/MEDQ  D:  08/23/2008  T:  08/24/2008  Job:  [id number]   cc:   Donna Bernard, M.D.  Fax: [phone]   Tilda Burrow, M.D.  Fax: [phone]

## 2010-12-30 NOTE — Op Note (Signed)
NAMEITALIA, WOLFERT               ACCOUNT NO.:  [id number]   MEDICAL RECORD NO.:  [id number]          PATIENT TYPE:  AMB   LOCATION:  DAY                           FACILITY:  [facility]   PHYSICIAN:  Tilda Burrow, M.D. DATE OF BIRTH:  01/08/1946   DATE OF PROCEDURE:  DATE OF DISCHARGE:                               OPERATIVE REPORT   PREOPERATIVE DIAGNOSIS:  1. Left lower quadrant pain.  2. Adhesions to the left tube and ovary.   POSTOPERATIVE DIAGNOSES:  1. Inflamed left tube and ovary secondary to      Diverticulosis/Diverticulitis  2. Left adnexal adhesions status post diverticular disease.   PROCEDURE:  Diagnostic laparoscopy.   INDICATIONS:  A 65 year old female, now approximately 6-8 weeks status  post evaluation of left lower quadrant pain with CT identifying a  thickening adjacent to the bowel who underwent colonoscopy, which was  negative for diverticulitis.  Pelvic ultrasound showed some thickening  and soft tissue mass consistent with left ovary 3.6 x 2.3 x 2.1 cm area  just above the vaginal cuff on the left reproducing a discomfort with  normal CA-125 without ascites or adenopathy.  She has a medical history  of sigmoid diverticulosis with no episodes of acute diverticulitis.   DETAILS OF THE PROCEDURE:  The patient was taken to the operating room,  prepped and draped for a combined abdominal and vaginal procedure with  single-tooth tenaculum used to lightly grasp the tissue of the vaginal  cuff, and abdomen was prepped and draped with Technicare due to a  history of IODINE allergy.  A time-out was conducted prior to beginning  the procedures.  IV antibiotics, Ancef 1g IV administered  perioperatively and Flowtrons were in place.  The patient had an  infraumbilical vertical 1-cm skin incision made as well as transverse  suprapubic 1-cm incision.  Veress needle was introduced easily and water  droplet test confirmed intraperitoneal location.  Pneumoperitoneum was  achieved under 3 L of CO2 and abdomen inspected with laparoscopic trocar  after direct insertion, under visualization.  The bowel was inspected  and there was no evidence of trauma, injury, bleeding, or anything  related to trocar placement.  Suprapubic 10-mm trocar was placed under  direct visualization and a 5-mm trocar was placed in the right lower  quadrant, being careful to straight lateral to the inferior epigastric  vessels, which could be visualized.  Attention was directed to the  pelvis.  The patient had lots of bowel that required Trendelenburg  position and a placement of a fourth trocar in the left lower quadrant  to manipulate bowel.  Bowel can be elevated sufficiently on photo #2.  There was evidence of epiploic attachments to the adnexa.  Using  harmonic scalpel, the epiploic appendage was dissected free from the  underlying tissue, allowing improved access to the adnexa.  There was no  purulence at any point.  There was erythematous process suggestive of  inflammatory process.  Using careful blunt manipulation of the bowel, we  were able to identify the sigmoid with several areas of sigmoid  diverticulosis along its surface.  At no time did we suspect any injury  to the bowel.  As we tried to identify the pelvic side wall lateral to  the sigmoid colon, it became apparent that there was significant dense  adherence from the sigmoid colon to the tube and ovary.   Manipulation of the cuff identified that there was sufficient room to  get below the ovary and cuff attachments, and there was open space below  the adhesions.  There was no evidence of purulence throughout the  process.  The sigmoid attachments to the sidewall were sufficient that  easy dissection free was not possible.  Intraoperative consultation was  obtained with Dr. Leticia Penna.  Review of history and physical findings were  discussed with Dr. Leticia Penna, and decision was made to discontinue efforts  of dissection  in order to avoid bowel penetration and to proceed with  monitoring of the post-inflammatory recovery.  Antibiotics will be  administered orally for a period of 10 days, Augmentin b.i.d.  The  patient will be allowed to go home.   Deflation of the abdomen followed with removal of laparoscopic  equipments.  Fascia was closed with suprapubic and umbilical site using  a 0 Vicryl.  Subcuticular closure of all 4 puncture sites were performed  and Steri-Strips and bandage placed over the incisions.  Sponge and  needle counts were correct.      Tilda Burrow, M.D.  Electronically Signed     JVF/MEDQ  D:  11/26/2008  T:  11/27/2008  Job:  [id number]   cc:   Kassie Mends, M.D.  [address]  [place] , [place] [address]   Jonna Coup. Gerda Diss, MD  Fax: [phone]

## 2010-12-30 NOTE — Procedures (Signed)
[name], Nichole [name]               ACCOUNT NO.:  [id number]   MEDICAL RECORD NO.:  [id number]          PATIENT TYPE:  OUT   LOCATION:  SLEE                          FACILITY:  [facility]   PHYSICIAN:  Kofi A. Gerilyn Pilgrim, M.D. DATE OF BIRTH:  [birth year]   DATE OF PROCEDURE:  06/01/2008  DATE OF DISCHARGE:  06/01/2008                             SLEEP DISORDER REPORT   PROCEDURE:  Nocturnal polysomnography.   REFERRING PHYSICIAN:  Donna Bernard, M.D.   INDICATIONS FOR PROCEDURE:  This is a 65 year old lady who presents with  snoring and marked daytime sleepiness.   MEDICATIONS:  Hydrochlorothiazide, alprazolam.   EPWORTH SLEEPINESS SCALE:  20.   BMI:  33.   ARCHITECTURAL SUMMARY:  Total recording time is 419 minutes.  The sleep  efficiency 93%.  Sleep latency 12.5 minutes.  REM latency 74 minutes.   STAGE N1:  6.5%.   STAGE N2:  56.7%.   STAGE N3:  10%.   REM SLEEP:  27%.   RESPIRATORY SUMMARY:  The patient was noted to have mild events on end  expiratory with an AHI of 10.5 but did have marked desaturation to 68  during the first REM cycle at which time she was given 1.5 liters.  Even  with the oxygen she did have desaturation during the second and third  REM cycles desatting down to the 80s.  Again, most of these obstructive  events occurred during REM sleep.   LIMB MOVEMENT SUMMARY:  PLM index 6.8.   ELECTROCARDIOGRAM SUMMARY:  Average heart rate 67 with isolated PVCs and  PACs observed.   IMPRESSION:  1. 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  2. Mild periodic limb movement disorder.   RECOMMENDATIONS:  Although the patient did not meet criteria for a split  night study, I would recommend formal titration study given the marked  oxygen desaturation.   Thank you for this referral.      Kofi A. Gerilyn Pilgrim, M.D.  Electronically Signed     KAD/MEDQ  D:  06/16/2008  T:  06/16/2008  Job:  [id number]

## 2010-12-30 NOTE — H&P (Signed)
Nichole [name], Nichole [name] NO.:  [id number]   MEDICAL RECORD NO.:  [id number]         PATIENT TYPE:  PAMB   LOCATION:  DAY                           FACILITY:  [facility]   PHYSICIAN:  Tilford Pillar, MD      DATE OF BIRTH:  06-07-1946   DATE OF ADMISSION:  DATE OF DISCHARGE:  LH                              HISTORY & PHYSICAL   CHIEF COMPLAINT:  History of bilateral lower quadrant abdominal pain.   HISTORY OF PRESENT ILLNESS:  The patient is a 65 year old female who was  referred to my office by Dr. Gerda Diss and Dr. Emelda Fear with a history of  left lower quadrant and some right lower quadrant suprapubic abdominal  pain.  The pain is mostly in the left lower quadrant and the pain is  constant.  She does have occasional fevers and chills, and occasional  nausea.  She has had no recent bowel changes.  She has had occasional  blood in stools, but no current melena or hematochezia.  She did undergo  a diagnostic laparoscopy by Dr. Emelda Fear and has recovered well from the  patient.   PAST MEDICAL HISTORY:  1. COPD.  2. Hypertension.  3. Depression.   PAST SURGICAL HISTORY:  1. She has had a previous radical hysterectomy.  2. Appendectomy.  3. Prior lower back lumbar fusion.  4. She has had previous cervical fusions.   MEDICATIONS:  Reviewed with the patient including:  1. Hydrochlorothiazide.  2. Albuterol.  3. Xanax.  4. Cardura.  5. Advair.  6. Cardizem.  7. __________.  8. Flonase.  9. Calcium.  10.Aspirin.  11.Celexa.   ALLERGIES:  IODINE and MORPHINE both cause rash.   SOCIAL HISTORY:  No tobacco and no alcohol use.   REVIEW OF SYSTEMS:  CONSTITUTIONAL:  Headaches.  EYES:  Unremarkable.  EARS, NOSE, THROAT:  Rhinorrhea.  RESPIRATORY:  Shortness of breath and  wheezing.  CARDIOVASCULAR:  Unremarkable.  GASTROINTESTINAL:  Abdominal  pain, otherwise unremarkable.  GENITOURINARY:  Unremarkable.  MUSCULOSKELETAL:  Arthralgias of the neck and back.  SKIN:  Unremarkable.  ENDOCRINE:  Complains of lack of energy.  NEUROLOGY:  Unremarkable.   PHYSICAL EXAMINATION:  GENERAL:  The patient is not in any acute  distress.  She is calm, she is obese, and she is alert and oriented x3.  HEENT:  Scalp with no deformities and no masses.  Eyes; pupils equal,  round, and reactive.  Extraocular movements are intact.  __________  pallor is noted.  The oral mucosa is pink, normal occlusion.  NECK:  Trachea is midline.  No cervical lymphadenopathy.  PULMONARY:  Unlabored respirations.  No wheezes or crackles are apparent  in the office.  She is clear to auscultation bilaterally.  CARDIOVASCULAR:  Regular rate and rhythm.  She has 2+ radial and  dorsalis pedis pulses bilaterally.  ABDOMEN:  Positive bowel sounds.  Abdomen is soft, obese.  She has mild  left lower quadrant abdominal tenderness.  No hernias or masses are  appreciated.  SKIN:  Warm and dry.   LABORATORY DATA:  The patient had a CT of  the abdomen and pelvis which  demonstrated inflammatory changes in the left lower quadrant.  No masses  are appreciated.  Colonoscopy did demonstrate positive diverticulum  suspicious for diverticulitis.  No masses were encountered.  The patient  did have again diagnostic laparoscopy at which time the ovary was  identified and was noted to be adherent to the sigmoid colon.  No  resection was done at that time.   ASSESSMENT/PLAN:  Chronic diverticulitis.  At this time I did have a  long discussion with the patient in regard to the findings and the  history of diverticulitis and we do have some suspicion that the  findings during her diagnostic laparoscopy are likely consistent with a  chronic inflammatory process with the sigmoid colon.  At this time I  have a low suspicion of any other etiology of her symptoms.  However,  further etiologies were discussed with the patient.  Based on the  findings, I did discuss risks, benefits and alternatives of  laparoscopic,  hand-assisted, possible open sigmoid colectomy.  In  addition due to the location of the findings during her last procedure  with the left ovary, I did discuss with the patient the possible need  for oophorectomy depending on the ability to free the ovary from the  surrounding colon.  She does understand that this is a possible need  concerning the procedure and does understand the risks, benefits and  alternatives including, but not limited to the risk of bleeding,  infection, anastomotic leak and possible requirement of a colostomy as  well as the possibility of intraoperative cardiac and pulmonary event.  The patient's questions and concerns were addressed.  The patient will  be consented for the planned procedure at her earliest convenience.      Tilford Pillar, MD  Electronically Signed    BZ/MEDQ  D:  01/24/2009  T:  01/24/2009  Job:  [id number]   cc:   Dr. Gerda Diss   Dr. Emelda Fear

## 2011-01-02 NOTE — H&P (Signed)
[place]. [facility]  Patient:    Nichole [name], Nichole [name]                        MRN: [id number] Adm. Date:  04/02/00 Attending:  Payton Doughty, M.D.                         History and Physical  ADMISSION DIAGNOSIS:  Herniated disk on the right side at L2-3.  HISTORY:  This is a 65 year old, right-handed white lady who has undergone two fusions, one at L4-5, and one at L5-S1 and has done reasonably well.  She developed right hip and leg pain, and an MRI shows a foraminal herniated disk at L2-3, and she is admitted for a diskectomy.  PAST MEDICAL HISTORY:  Remarkable for hypertension.  She also has mild asthma.  ALLERGIES:  IODINE AND MORPHINE.  PAST SURGICAL HISTORY:  Tonsillectomy, hysterectomy, appendectomy, and the two fusions noted above.  FAMILY HISTORY:  Mom is age 61 years of age, with Alzheimers disease.  Daddy is at age 68 years of age with a myocardial infarction.  SOCIAL HISTORY:  She quit smoking two years ago.  She does not drink alcohol. She is on disability from [employer].  REVIEW OF SYSTEMS:  Noncontributory for bladder dysfunction.  PHYSICAL EXAMINATION:  HEENT:  Within normal limits.  NECK:  She has a reasonable range of motion.  CHEST:  Clear.  CARDIAC:  A regular rate and rhythm.  ABDOMEN:  Nontender, without hepatosplenomegaly.  EXTREMITIES:  No clubbing or cyanosis.  GENITOURINARY:  Deferred.  EXTREMITIES:  Peripheral pulses good.  NEUROLOGIC:  She is awake, alert, oriented.  Cranial nerves intact.  Motor examination demonstrates 5/5 strength throughout the upper and lower extremities.  Straight leg raising is very positive on the right.  Reflexes are a flicker at the right, 2 on the left, absent at the ankles bilaterally.  The MRI results have been reviewed as above.  This showed a foraminal disk at L2-3 on the right side.  CLINICAL IMPRESSION:  L2 and L3 radiculopathy, related to a foraminal disk.  PLAN:  A lumbar  laminectomy and diskectomy.  The risks and benefits have been discussed with her, and she wishes to proceed. DD:  04/02/00 TD:  04/02/00 Job: [id number]

## 2011-01-02 NOTE — H&P (Signed)
NAMEMAGGY, Nichole [name]                         ACCOUNT NO.:  [id number]   MEDICAL RECORD NO.:  [id number]                   PATIENT TYPE:  OIB   LOCATION:  2899                                 FACILITY:  [facility]   PHYSICIAN:  Payton Doughty, M.D.                   DATE OF BIRTH:  22-Aug-1945   DATE OF ADMISSION:  12/05/2002  DATE OF DISCHARGE:                                HISTORY & PHYSICAL   ADMISSION DIAGNOSIS:  Herniated disk, L2-3.   HISTORY OF PRESENT ILLNESS:  This is a now 65 year old, right-handed, white  lady.  She has undergone L4-5 and L5-S1 fusion in the past.  She has also  had a fair amount of herniated disk on the right side at L2-3.  She had been  doing reasonably well and then developed marked increase in pain in her back  and down her right leg.  An MRI shows a herniated disk at L2-3 centrally in  the canal and she is admitted for a diskectomy.   PAST MEDICAL HISTORY:  Remarkable for:  1. Hypertension.  2. Mild asthma.   ALLERGIES:  She is allergic to IODINE and MORPHINE.   PAST SURGICAL HISTORY:  1. Tonsillectomy.  2. Hysterectomy.  3. Appendectomy.  4. Fusions.   FAMILY HISTORY:  Mom is 55 with Alzheimer's.  Daddy died at 67 years of age  with MI.   SOCIAL HISTORY:  She quit smoking four years ago.  She does not drink  alcohol.  She is on disability.   REVIEW OF SYSTEMS:  Noncontributory for bladder dysfunction.  She does have  back pain and leg pain.   PHYSICAL EXAMINATION:  HEENT:  Within normal limits.  NECK:  She has reasonable range of motion.  CHEST:  Clear.  CARDIAC:  Regular rate and rhythm.  ABDOMEN:  Nontender with no hepatosplenomegaly.  EXTREMITIES:  Without clubbing or cyanosis.  GENITOURINARY:  Deferred.  PERIPHERAL PULSES:  Good.  NEUROLOGIC:  She is awake, alert, and oriented.  Cranial nerves are intact.  Motor Exam:  She has 5/5 strength throughout the upper and lower  extremities, save for the hip flexions on the right which are  4/5 due to  pain limitation.  She has a right L3 sensory deficit.  Reflexes absent at  the right knee, 1 at the left knee, and 1 at the ankles bilaterally.  Straight leg raise is positive.  MRI demonstrates a large herniated disk at  L2-3 slightly eccentric to the right.   CLINICAL IMPRESSION:  Herniated disk at L2-3 with a right L3 radiculopathy.   PLAN:  Lumbar laminectomy and diskectomy.  The risks and benefits of this  approach have been discussed with her and she wishes to proceed.  Payton Doughty, M.D.    MWR/MEDQ  D:  12/05/2002  T:  12/05/2002  Job:  [id number]

## 2011-01-02 NOTE — Op Note (Signed)
Nichole [name], Nichole [name]                         ACCOUNT NO.:  [id number]   MEDICAL RECORD NO.:  [id number]                   PATIENT TYPE:  OIB   LOCATION:  2899                                 FACILITY:  [facility]   PHYSICIAN:  Payton Doughty, M.D.                   DATE OF BIRTH:  09/26/1945   DATE OF PROCEDURE:  12/05/2002  DATE OF DISCHARGE:                                 OPERATIVE REPORT   PREOPERATIVE DIAGNOSIS:  Herniated disk at L2-3.   POSTOPERATIVE DIAGNOSIS:  Herniated disk at L2-3, plus right pars and  articularis fracture of L2.   PROCEDURE:  L2-3 laminectomy, diskectomy, and posterior lumbar interbody  fusion with Ray fusion cages and posterolateral bone graft in an  intertransverse position.   SURGEON:  Payton Doughty, M.D.   ANESTHESIA:  General endotracheal.  Prepped with Betadine prep and scrubbed  with alcohol wipe.   COMPLICATIONS:  None.   ASSISTANT:  Neuro assistant is [place] and doctor assistant is Clydene Fake, M.D.   INDICATIONS FOR PROCEDURE:  A 65 year old right-handed white female with a  large herniated disk at L2-3.   DESCRIPTION OF PROCEDURE:  She was taken to the operating room and smoothly  anesthetized and intubated, placed prone on the operating table.  Following  shave, prepped and draped in the usual sterile fashion. The skin was  infiltrated with 1% lidocaine with 400,000 epinephrine.  The skin was  incised from the mid L3 to mid L1. The lamina on the right side was exposed.  Intraoperative x-ray was taken to confirm correctness of level.  The  hemisemilaminectomy was begun on the right side of L2 and when commencing  this portion of the procedure, it was noted that the pars and articularis  was fractured through to the base of the lamina and the base of the spinous  process on the right side.  Therefore, it was decided that this was not a  stable construct and a fusion will be required.  Therefore, the pars and  articularis lamina of  the inferior facet was removed bilaterally as was the  superior facet of L3.  This allowed dissection of both L2 and L3 roots.  These were completely decompressed and ligamentum flavum was removed.  Diskectomy was then carried out and a large free fragment of disk was  removed from the anterior epidural space. Diskectomy was completed at L2-3.  With the 2 and 3 roots under retraction, being protected, Ray fusion cages  were placed followed by 21 mm.  The left side was uneventful. On the right  side, there was a tear in the axilla of the right L2 root. It was not  feasible to close it with a stitch because of the geometry of the tear in  the axilla, so a piece of a Duragen was placed in it and thrombin-soaked  Gelfoam was placed over  it. The cages were then packed with bone graft  harvested from the facet joints as well as DBX and bone croutons which had  been crunched up. Intertransverse bone was then placed after decortication  of the transverse processes of L2 and L3. Intraoperative x-ray demonstrated  good placement of cages. They were capped. The fascia was reapproximated  with 0 Vicryl in interrupted fashion, the subcutaneous tissues were  reapproximated with 0 Vicryl in interrupted fashion, the  subcuticular tissues were reapproximated with 3-0 Vicryl in interrupted  fashion, the skin was closed with 3-0 nylon in a running locking fashion.  Bacitracin and Telfa dressing was applied and made occlusive with Op-Site  and the patient returned to the recovery room in good condition.                                               Payton Doughty, M.D.    MWR/MEDQ  D:  12/05/2002  T:  12/05/2002  Job:  [id number]

## 2011-01-02 NOTE — Procedures (Signed)
Nichole [name], Nichole [name]               ACCOUNT NO.:  [id number]   MEDICAL RECORD NO.:  [id number]          PATIENT TYPE:  OUT   LOCATION:  RESP                          FACILITY:  [facility]   PHYSICIAN:  Edward L. Juanetta Gosling, M.D.DATE OF BIRTH:  Dec 09, 1945   DATE OF PROCEDURE:  DATE OF DISCHARGE:                            PULMONARY FUNCTION TEST   Patient of Dr. Lubertha South.  1. Spirometry shows a mild ventilatory defect with evidence of airflow      obstruction that is most marked in the smaller airways.  2. Lung volumes show no evidence of restrictive disease, but do show      air trapping.  3. DLCO is mildly reduced.  4. Arterial blood gases are show moderate relative resting hypoxemia.  5. There is no significant bronchodilator improvement.      Edward L. Juanetta Gosling, M.D.  Electronically Signed     ELH/MEDQ  D:  10/27/2008  T:  10/27/2008  Job:  [id number]

## 2011-01-02 NOTE — Discharge Summary (Signed)
[place]. [facility]  Patient:    Nichole [name], Nichole [name]                        MRN: [id number] Adm. Date:  04/02/00 Disc. Date: 04/06/00 Attending:  Payton Doughty, M.D.                           Discharge Summary  ADMISSION DIAGNOSIS:  Foraminal disk on the right at L2-3.  DISCHARGE DIAGNOSIS:  Foraminal disk on the right at L2-3.  OPERATIVE PROCEDURE:  Right L2-3 laminectomy done extraforaminally.  COMPLICATIONS:  CSF leak.  DISCHARGE STATUS:  Alive and well.  HISTORY OF PRESENT ILLNESS:  This is a 65 year old, right-handed white lady whose History and Physical is recounted in the chart.  She has had several lumbar fusions and has done well, and she now is admitted with a right L2-3 extraforaminal disk and right L2 radiculopathy.  PAST MEDICAL HISTORY:  Otherwise remarkable for hypertension.  MEDICATIONS:  Ziac.  ALLERGIES:  IV IODINE.  PHYSICAL EXAMINATION:  GENERAL:  Unremarkable.  NEUROLOGIC:  Right L2-3 radiculopathy.  HOSPITAL COURSE:  She was admitted after ascertaining normal laboratory values and underwent extraforaminal diskectomy at L2-3, and a large extraforaminal disk was found.  She had a lateral dural tear with no CSF produced.  It was packed.  Postoperatively, she was kept down in the bed for three days to assure that no leak developed.  Her leg pain is virtually gone.  She has a little bit of right hip pain.  Her incision is dry and healing well.  Her strength is full.  When she got up out of the bed yesterday, she did not have any headache.  She is now up and about and competent in her activities of daily living.  DISPOSITION:  She is being discharged home to the care of her family with Percocet for pain.  Her followup will be in the [facility] office in about a week for suture removal. DD:  04/06/00 TD:  04/06/00 Job: [id number]

## 2011-01-02 NOTE — H&P (Signed)
NAMEMAGGI, HERSHKOWITZ NO.:  [id number]   MEDICAL RECORD NO.:  [id number]          PATIENT TYPE:  INP   LOCATION:  3302                         FACILITY:  [facility]   PHYSICIAN:  Payton Doughty, M.D.      DATE OF BIRTH:  [birth year]   DATE OF ADMISSION:  05/21/2006  DATE OF DISCHARGE:                                HISTORY & PHYSICAL   ADMISSION DIAGNOSES:  Cervical spondylosis.   SERVICE:  Neurosurgery.   BODY OF TEXT:  This is a 65 year old right-handed white lady who had a  lumbar decompression and disk in 2001.  Did reasonably well with that.  Over  the past months has had increasing pain in her neck and some difficulty with  dropping things and strength in her arms.  MR had revealed a significant  cervical spondylosis, and she was to have an anterior decompression and  fusion on July 25th.  She had abnormality of her EKG, which was subsequently  evaluated by cardiologist with extensive testing, found to be safe for her  operation.  She is now admitted for an anterior decompression and fusion.   Medical history is remarkable for  COPD, hypertension.   CURRENT MEDICATIONS:  Aspirin, calcium, Diltiazem, doxazosin, triamterene,  hydrochlorothiazide, vitamin E, Xanax, Vicodin.   ALLERGIES:  IODINE, MORPHINE.   SOCIAL HISTORY:  She has quit smoking, does not drink.  Is on disability  from [employer].   FAMILY HISTORY:  Mother is 34 with Alzheimer's disease.  Father died at 90  with an MI.   REVIEW OF SYSTEMS:  __________ bladder dysfunction, glasses, neck pain, and  arm pain.   PHYSICAL EXAMINATION:  HEENT:  Within normal limits.  She has somewhat  limited range of motion in her neck because of pain.  CHEST:  A few crackles.  CARDIAC:  Regular rate and rhythm.  ABDOMEN:  Large but nontender with no hepatosplenomegaly.  EXTREMITIES:  No clubbing or cyanosis.  Peripheral pulses are good.  GU:  Deferred.  NEUROLOGIC:  She is awake, alert and oriented.  Cranial  nerves are intact.  Motor exam shows 5/5 strength throughout the upper and lower extremities.  Hoffman's is positive bilaterally.  Lower extremities are nonmyelopathic.   She comes in with an MRI that shows significant spondylosis at C3-4, C4-5,  C5-6, C6-7 does not look too bad.  The canals narrowed to about 9 mm.   CLINICAL IMPRESSION:  Cervical spondylosis with early myelopathy.   PLAN:  An anterior decompression fusion at C3-4, C4-5, and C5-6.  The risks  and benefits of this approach have been discussed with her, and she wishes  to proceed.  She has received clearance from the cardiologist.           ______________________________  Payton Doughty, M.D.     MWR/MEDQ  D:  05/21/2006  T:  05/22/2006  Job:  [id number]

## 2011-01-02 NOTE — Procedures (Signed)
NAMELILLYAHNA, Nichole [name]               ACCOUNT NO.:  [id number]   MEDICAL RECORD NO.:  [id number]          PATIENT TYPE:  OUT   LOCATION:  SLEEP LAB                     FACILITY:  [facility]   PHYSICIAN:  Marcelyn Bruins, M.D. [facility] DATE OF BIRTH:  1946-01-18   DATE OF STUDY:  08/23/2004                              NOCTURNAL POLYSOMNOGRAM   REFERRING PHYSICIAN:  Scott A. Luking, MD.   INDICATION FOR STUDY:  Hypersomnia with sleep apnea.   EPWORTH SCORE:  10.   SLEEP ARCHITECTURE:  The patient had total sleep time of 248 minutes with  decreased REM and never achieved slow wave sleep. Sleep onset latency was  prolonged at 61 minutes, and REM latency was prolonged as well.   IMPRESSION:  1.  Very mild obstructive sleep apnea/hypopnea syndrome with a respiratory      disturbance index of six events per hour and O2 desaturation as low as      81%. Events were not positional but were associated with very loud      snoring. 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  Treatment for this      degree of  sleep disorder breathing, can include weight loss alone, oral      appliance, upper airway surgery, or possibly CPAP. Clinical correlation      is suggested.  2.  No clinically significant cardiac arrhythmias.  3.  Large numbers of leg jerks with significant sleep disruption. Again,      clinical correlation is suggested.      KC/MEDQ  D:  09/02/2004 12:45:46  T:  09/02/2004 14:14:32  Job:  [id number]

## 2011-01-02 NOTE — Discharge Summary (Signed)
[name], [name] NO.:  [id number]   MEDICAL RECORD NO.:  [id number]          PATIENT TYPE:  INP   LOCATION:  3031                         FACILITY:  [facility]   PHYSICIAN:  Payton Doughty, M.D.      DATE OF BIRTH:  1945-10-05   DATE OF ADMISSION:  05/21/2006  DATE OF DISCHARGE:  05/24/2006                               DISCHARGE SUMMARY   ADMISSION DIAGNOSIS:  Cervical spondylosis.   DISCHARGE DIAGNOSIS:  Cervical spondylosis.   OPERATION/PROCEDURE:  C3-4, C4-5, C5-6 anterior decompression and  fusion.   COMPLICATIONS:  None.   DISCHARGE STATUS:  Doing well.   BODY OF TEXT:  A 65 year old right-handed white lady whose history and  physical is recounted in the chart.  She had been having increased pain  in her neck and difficulty dropping things.  MRI revealed significant  spondylosis.  She had a cardiology evaluation that found her safe, and  she was admitted on October 5th and went to the OR.  Fusion went well.  Postoperatively, she spent 1 or 2 days out on the floor.  Her strength  is intact.  Her airway was fine.  October 7th, she was up eating.  Foley  catheter was removed.  October 8th, she was up, with mild dysphagia,  which was observed for a day.  Next day on the 9th, her dysphagia was  much better.  Strength was full, incisions dry and well-healing, and she  was discharged home.  Her followup will be in the [facility] office in 1 week with an x-ray.           ______________________________  Payton Doughty, M.D.     MWR/MEDQ  D:  08/13/2006  T:  08/14/2006  Job:  [id number]

## 2011-01-02 NOTE — Discharge Summary (Signed)
Nichole [name], Nichole [name]                         ACCOUNT NO.:  [id number]   MEDICAL RECORD NO.:  [id number]                   PATIENT TYPE:  OIB   LOCATION:  3040                                 FACILITY:  [facility]   PHYSICIAN:  Payton Doughty, M.D.                   DATE OF BIRTH:  July 06, 1946   DATE OF ADMISSION:  12/05/2002  DATE OF DISCHARGE:  12/09/2002                                 DISCHARGE SUMMARY   ADMITTING DIAGNOSIS:  Herniated disk at L2-3.   DISCHARGE DIAGNOSIS:  Herniated disk at L2-3.   PROCEDURE:  L2-3 laminectomy and diskectomy, posterior lumbar interbody  fusion procedure with Ray threaded fusion cage.   SERVICE:  Neurosurgery.   COMPLICATIONS:  CSF leak.   DISCHARGE STATUS:  Alive and well.   BODY OF TEST:  This is a 65 year old right-handed white girl whose history  and physical is recounted in the chart.  She had had a prior  foraminal/extraforaminal disk done at 2-3 several years ago and had done  well, now presented with disk within the canal.   MEDICAL HISTORY:  Medical history is otherwise remarkable for hypertension  and mild asthma.   ALLERGIES:  She is allergic to IODINE and MORPHINE.   SURGICAL HISTORY:  Tonsillectomy, hysterectomy, appendectomy and several  lumbar fusions at 4-5 and 5-1.   PHYSICAL EXAMINATION:  GENERAL:  General examination was remarkable for  obesity.  NEUROLOGIC:  Neurologic exam was remarkable for L3 radiculopathy  bilaterally.   HOSPITAL COURSE:  She was admitted after ascertainment of normal laboratory  values and went to the OR.  The planned procedure was a lumbar diskectomy,  however, intraoperatively, it was discovered that she had a pars fracture on  the right and this necessitated going to the fusion rather than simple  diskectomy.  A very large disk was found intraoperatively.  She underwent a  2-3 posterior lumbar interbody fusion.  There was a CSF leak on the right  side that was patched without difficulty.   Postoperatively, she has done  well.  She was kept down for three days because of the leak to make sure  that it did not continue.  She has then been up and about since yesterday  with no headache, eating and voiding normally.  Her strength is full, her  incision is dry and well-healing.  She is being discharged home to the care  of her family with Lortab for pain.    FOLLOWUP:  Her followup will be in the [facility]  office in about a week for suture removal.                                               Payton Doughty, M.D.    MWR/MEDQ  D:  12/09/2002  T:  12/11/2002  Job:  [id number]

## 2011-01-02 NOTE — Op Note (Signed)
NAMEGAE, BIHL NO.:  [id number]   MEDICAL RECORD NO.:  [id number]          PATIENT TYPE:  INP   LOCATION:  3302                         FACILITY:  [facility]   PHYSICIAN:  Payton Doughty, M.D.      DATE OF BIRTH:  1945-10-04   DATE OF PROCEDURE:  05/21/2006  DATE OF DISCHARGE:                                 OPERATIVE REPORT   PREOPERATIVE DIAGNOSIS:  Spondylosis, C3-4, C4-5, C5-6.   POSTOPERATIVE DIAGNOSIS:  Spondylosis, C3-4, C4-5, C5-6.   OPERATIVE PROCEDURE:  C3-4, C4-5 and C5-6 anterior cervical decompression  and fusion with a Reflex Hybrid plate.   SURGEON:  Payton Doughty, M.D.   DOCTOR ASSISTANT:  Coletta Memos, M.D.   NURSE ASSISTANT:  [place].   SERVICE:  Neurosurgery.   ANESTHESIA:  General endotracheal.   PREPARATION:  Betadine scrub and alcohol wipe.   COMPLICATIONS:  None.   BODY OF TEXT:  A 65 year old girl with severe cervical spondylytic  myelopathy, taken to the operating room, smoothly anesthetized and  intubated, placed supine on the operating table in the halter head traction.  Following shave, prep and drape in the usual sterile fashion, the skin was  incised parallel to the sternocleidomastoid, starting just below the level  of the carotid tubercle and extending to just below the angle of the jaw.  The platysma was identified, elevated, divided and undermined.  The  sternocleidomastoid was identified, and medial dissection revealed the  carotid artery, retracted laterally to the left, and the trachea and  esophagus retracted laterally to the right.  This exposed the bones of the  anterior cervical spine.  Marker was placed.  Intraoperative x-ray obtained  to confirm correctness of the level.  Having confirmed the correctness of  the level, the longus colli was taken down bilaterally.  Shadowline self-  retaining retractor was placed transversely in a cephalocaudal direction,  protruding the esophagus.  Diskectomy was carried out,  then, at 3-4, 4-5 and  5-6 under gross observation.  At C5-6, the joint was extremely ankylosed,  and it took a great deal of dissection and effort to get it apart.  C3-4 was  similarly ankylosed; 4-5 was not so bad.  Following this, the operating  microscope was then brought in, and we used microdissection technique to  remove the remaining disk, remove the offending osteophytes and open the  neural foramina bilaterally.  Following complete decompression, the wound  was irrigated and hemostasis assured.  Then, 7-mm bone grafts were fashioned  with patellar allograft and tapped into place.  One small area of dural  opening in the left axilla of the C6 root was covered with thrombin Gelfoam.  A Reflex Hybrid plate was then placed with 2 screws in C3, 2 in C4, 2 in C5  and 2 in C6; they were 10-mm screws.  Intraoperative x-ray showed good  placement of bone grafts, plate and screws.  The wound was irrigated and  hemostasis assured, and the platysma was reapproximated with 3-0 Vicryl in  interrupted fashion.  The subcutaneous tissues were reapproximated with 3-0  Vicryl in  interrupted fashion.  The skin  was closed with 4-0 Vicryl in a running, subcuticular fashion.  A Al Pimple drain was placed in the prevertebral space and exited via separate  incision.  The patient was then placed in an Aspen collar and returned to  the recovery room in good condition.           ______________________________  Payton Doughty, M.D.     MWR/MEDQ  D:  05/21/2006  T:  05/21/2006  Job:  [id number]

## 2011-01-02 NOTE — Op Note (Signed)
[place]  Patient:    Nichole [name], BRODBECK                        MRN: [id number] Proc. Date: 04/02/00 Adm. Date:  [id number] Attending:  Emeterio Reeve                           Operative Report  PREOPERATIVE DIAGNOSIS:  Foraminal disc at L2-3.  POSTOPERATIVE DIAGNOSIS:  Foraminal disc at L2-3.  OPERATIVE PROCEDURE:  L2-3 foraminal diskectomy.  SURGEON:  Payton Doughty, M.D.  ANESTHESIA:  General endotracheal.  PREP:  Betadine and alcohol wipe.  COMPLICATIONS:  None.  BODY OF TEXT:  This is a 65 year old lady with a right L2-3 radiculopathy related to a foraminal disc at L2-3.  DESCRIPTION OF PROCEDURE:  She is taking to the operating room.  Successfully intubated, placed prone on the operating room table.  Following shave, prep, and drape in the usual sterile fashion the skin is infiltrated with 1% lidocaine with 1:400,000 epinephrine.  Skin was incised from the top of L2 to the bottom of L1, and the lamina of L1-2 and L3 were exposed on the right side in the subperiosteal plane.  Marker was placed, intraoperative x-ray obtained. It was shown to be in the foramen at L1-2.  Diskectomy was carried out at the level below that.  Working laterally on the transverse process of L2 and L3, the intertransverse membrane was divided.  The L2 neuroforamen was identified, and working through the foraminal contents, the L2 nerve root was demonstrated.  Immediately beneath it was a large free fragment of disk that was removed piecemeal.  Exploration was continued medially.  A small nick was made in the dura.  The arachnoid did not rupture, and CSF was not produced. The nerve root was carefully explored and all disk removed.  The dural defect was not repairable, so it was covered with thrombin soaked Gelfoam.  The fascia was reapproximated with 0 Vicryl interrupted fashion.  Subcutaneous tissues reapproximated with 0 Vicryl interrupted fashion.   Subcuticular tissues reapproximated with 0 Vicryl in interrupted fashion.  Skin was closed with 3-0 nylon in running locked fashion.  A Neosporin Telfa dressing was applied.  Made occlusive with OpSite.  The patient returned to the recovery room in good condition. DD:  04/02/00 TD:  04/04/00 Job: [id number]

## 2011-01-02 NOTE — Op Note (Signed)
[place]  Patient:    Nichole [name], Nichole [name]                        MRN: [id number] Proc. Date: 04/02/00 Adm. Date:  [id number] Attending:  Emeterio Reeve                           Operative Report  No dictation. DD:  04/02/00 TD:  04/04/00 Job: [id number]

## 2011-01-02 NOTE — Op Note (Signed)
[name], Nichole [name]               ACCOUNT NO.:  [id number]   MEDICAL RECORD NO.:  [id number]          PATIENT TYPE:  AMB   LOCATION:  DAY                           FACILITY:  [facility]   PHYSICIAN:  R. Roetta Sessions, M.D. DATE OF BIRTH:  23-Mar-1946   DATE OF PROCEDURE:  11/17/2004  DATE OF DISCHARGE:                                 OPERATIVE REPORT   PROCEDURE PERFORMED:  Screening colonoscopy.   INDICATIONS FOR PROCEDURE:  Patient is a 65 year old lady sent over by  courtesy of W. Simone Curia, M.D. for screening colonoscopy.  She states  she had a colonoscopy over 10 years ago.  She may have had problems but she  is not clear on this and don't have any detailed medical records available  for review.  She does not have any lower GI tract symptoms and there is no  family history of colorectal neoplasia.  Colonoscopy is now being done.  This approach has been discussed with the patient at length, potential  risks, benefits and alternatives have been reviewed and questions answered.  She is agreeable.  Please see documentation in medical records.   DESCRIPTION OF PROCEDURE:  Oxygen saturations, blood pressure, pulse and  respirations were monitored throughout the entire procedure.  Conscious  sedation with Versed 4 mg IV, Demerol 100 mg IV in divided doses.   INSTRUMENT USED:  Olympus video chip system.   FINDINGS:  A digital rectal exam revealed no abnormalities.   ENDOSCOPIC FINDINGS:  Prep is adequate.   Rectum:  Examination of rectal mucosa including retroflexion and view of the  anal verge revealed no abnormalities.  Colon:  Colinic mucosa was surveyed from the rectosigmoid junction to the  left, transverse, right colon to the area of the appendiceal orifice,  ileocecal valve and cecum.  These structures were well seen and photographed  for the record.  From this level, the scope was slowly withdrawn.  All  previously mentioned mucosal surfaces were again seen.  The patient  was  noted to have sigmoid diverticula.  The remainder of the colonic mucosa  appeared normal.  The patient tolerated the procedure well, was reacted in  endoscopy.   IMPRESSION:  1.  Normal rectum.  2.  Sigmoid diverticula.  3.  Remainder of colonic mucosa appeared normal.   RECOMMENDATIONS:  1.  Diverticulosis, literature provided to Ms. Boen.  2.  Repeat colonoscopy in five years, given her reported history.      RMR/MEDQ  D:  11/17/2004  T:  11/17/2004  Job:  [id number]   cc:   Donna Bernard, M.D.  [address]  [place]  [place] [address]  Fax: [phone]

## 2011-01-02 NOTE — Discharge Summary (Signed)
Nichole [name], Nichole [name] NO.:  [id number]   MEDICAL RECORD NO.:  [id number]          PATIENT TYPE:  INP   LOCATION:  A332                          FACILITY:  [facility]   PHYSICIAN:  Tilford Pillar, MD      DATE OF BIRTH:  [birth year]   DATE OF ADMISSION:  01/25/2009  DATE OF DISCHARGE:  06/17/2010LH                               DISCHARGE SUMMARY   ADMISSION DIAGNOSIS:  Chronic sigmoid diverticulitis and left lower  quadrant pain.   POSTOPERATIVE DIAGNOSES:  1. Chronic sigmoid diverticula, status post laparoscopic-hand assisted      sigmoid colectomy and left oophorectomy.  2. Chronic obstructive pulmonary disease.  3. Hypertension.  4. Depression.   PROCEDURE:  Laparoscopic-hand assisted sigmoid colectomy and left  oophorectomy.   ADMITTING SURGEON:  Tilford Pillar, MD   DISPOSITION:  Home.   Brief H and P, please see the admission history and physical for the  complete H and P.  The patient is a 65 year old female, who is referred  to my office by Dr. Emelda Fear, after evaluation the patient for left  lower quadrant abdominal pain.  He had performed a previous diagnostic  laparoscopy, which identified a left ovary intimately adherent to the  sigmoid colon with evidence suggestive of diverticulitis and  diverticular changes.  She was initially treated with conservative  management and was referred to my office.  On evaluation, it was  suspected the patient did have a history of chronic diverticulitis based  on her symptomatology.  Plans were discussed with the patient including  risks, benefits, and alternatives of surgical intervention.  The patient  was admitted on January 25, 2009, for the planned procedure.   HOSPITAL COURSE:  The patient was admitted on January 25, 2009, she  underwent the planned laparoscopic-hand assisted sigmoid colectomy and  left oophorectomy on the same day.  She spent a brief period in the  Postanesthetic Care Unit and then was transferred  back to the surgical  floor.  She did well postoperatively.  Pain was controlled on a  initially on IV pain medications.  Upon establishing some bowel  function, she was advanced on her diet and she tolerated this.  She was  switched oral pain medication.  She was ambulatory and bowel function  was excellent.  She was tolerating regular diet and oral pain control  and the patient was ready for discharge on January 31, 2009.   DISCHARGE INSTRUCTIONS:  The patient was instructed to resume a normal  diet.  She may continue to wash the incision with soap and water.  She  is to increase her activity slowly.  She may walk up steps.  She is not  to lift anything greater than 20 pounds for the next 4 weeks.  She may  shower, but she is not to soak the incision for approximately 3 weeks.  She is not to drive while taking pain medications.  She is to return to  see me in approximately 10 days for evaluation and possible staple  removal.   DISCHARGE MEDICATIONS:  She is to resume all previously prescribed  medications including Celexa, Vicodin, hydrochlorothiazide, albuterol,  Xanax, Cardura, Cardizem, Flovent, Flonase, and stool softener as  needed.      Tilford Pillar, MD  Electronically Signed     BZ/MEDQ  D:  02/22/2009  T:  02/22/2009  Job:  [id number]

## 2011-05-01 ENCOUNTER — Ambulatory Visit: Payer: 59 | Attending: Family Medicine | Admitting: Sleep Medicine

## 2011-05-01 DIAGNOSIS — G4710 Hypersomnia, unspecified: Secondary | ICD-10-CM

## 2011-05-01 DIAGNOSIS — Z6832 Body mass index (BMI) 32.0-32.9, adult: Secondary | ICD-10-CM | POA: Insufficient documentation

## 2011-05-01 DIAGNOSIS — G4733 Obstructive sleep apnea (adult) (pediatric): Secondary | ICD-10-CM | POA: Insufficient documentation

## 2011-05-04 NOTE — Procedures (Signed)
[name], [name]               ACCOUNT NO.:  [id number]  MEDICAL RECORD NO.:  [id number]          PATIENT TYPE:  OUT  LOCATION:  SLEEP LAB                     FACILITY:  [facility]  PHYSICIAN:  Bobie Kistler A. Gerilyn Pilgrim, M.D. DATE OF BIRTH:  1946/03/07  DATE OF STUDY:  05/01/2011                           NOCTURNAL POLYSOMNOGRAM  REFERRING PHYSICIAN:  Ardyth Gal  REFERRING PHYSICIAN:  Donna Bernard, MD  INDICATION FOR STUDY:  This is a 65 year old who presents with a marked hypersomnia, fatigue, and snoring.  This study is being done to evaluate for obstructive sleep apnea syndrome.  EPWORTH SLEEPINESS SCORE: 1. BMI 32.  MEDICATIONS:  Cardizem, Cardura, Flonase, hydrocodone, alprazolam, aspirin, albuterol, and hydrochlorothiazide.  SLEEP ARCHITECTURE:  Architectural summary:  The total recording time is 447.5 minutes.  Sleep efficiency 83 minutes.  Sleep latency 29.5 minutes.  REM latency 80 minutes.  Stage N1 of 14.4%, N2 of 46%, N3 of 7.5%, and REM sleep 32%,  RESPIRATORY DATA:  Respiratory summary:  Baseline oxygen saturation is 95, lowest saturation 75 during REM sleep.  Diagnostic AHI 7 and RDI 7.4.  There were more events during REM sleep with a REM AHI of 18.  CARDIAC DATA:  Electrocardiogram summary:  Average heart rate 56 with no significant dysrhythmias observed.  MOVEMENT-PARASOMNIA:  Limb movement summary:  PLM index 4.4.  IMPRESSIONS-RECOMMENDATIONS:  Mild obstructive sleep apnea syndrome, not requiring positive pressure treatment.     Kaysan Peixoto A. Gerilyn Pilgrim, M.D.   KAD/MEDQ  D:  05/04/2011 08:52:20  T:  05/04/2011 09:51:50  Job:  [id number]

## 2011-09-18 ENCOUNTER — Other Ambulatory Visit: Payer: Self-pay | Admitting: Family Medicine

## 2011-09-18 DIAGNOSIS — IMO0002 Reserved for concepts with insufficient information to code with codable children: Secondary | ICD-10-CM

## 2011-09-23 ENCOUNTER — Encounter (HOSPITAL_COMMUNITY): Payer: 59

## 2011-09-23 ENCOUNTER — Other Ambulatory Visit: Payer: Self-pay | Admitting: Family Medicine

## 2011-09-23 ENCOUNTER — Ambulatory Visit (HOSPITAL_COMMUNITY)
Admission: RE | Admit: 2011-09-23 | Discharge: 2011-09-23 | Disposition: A | Discharge status: Home / Self Care | Payer: Self-pay | Source: Ambulatory Visit | Attending: Family Medicine | Admitting: Family Medicine

## 2011-09-23 DIAGNOSIS — N63 Unspecified lump in unspecified breast: Secondary | ICD-10-CM | POA: Insufficient documentation

## 2011-09-23 DIAGNOSIS — IMO0002 Reserved for concepts with insufficient information to code with codable children: Secondary | ICD-10-CM

## 2011-09-23 DIAGNOSIS — N644 Mastodynia: Secondary | ICD-10-CM | POA: Insufficient documentation

## 2012-03-21 DIAGNOSIS — M47817 Spondylosis without myelopathy or radiculopathy, lumbosacral region: Secondary | ICD-10-CM | POA: Diagnosis not present

## 2012-03-21 DIAGNOSIS — I679 Cerebrovascular disease, unspecified: Secondary | ICD-10-CM | POA: Diagnosis not present

## 2012-03-24 DIAGNOSIS — R51 Headache: Secondary | ICD-10-CM | POA: Diagnosis not present

## 2012-04-11 DIAGNOSIS — M5126 Other intervertebral disc displacement, lumbar region: Secondary | ICD-10-CM | POA: Diagnosis not present

## 2012-05-03 DIAGNOSIS — I10 Essential (primary) hypertension: Secondary | ICD-10-CM | POA: Diagnosis not present

## 2012-05-03 DIAGNOSIS — Z23 Encounter for immunization: Secondary | ICD-10-CM | POA: Diagnosis not present

## 2012-05-03 DIAGNOSIS — J45909 Unspecified asthma, uncomplicated: Secondary | ICD-10-CM | POA: Diagnosis not present

## 2012-05-03 DIAGNOSIS — R5381 Other malaise: Secondary | ICD-10-CM | POA: Diagnosis not present

## 2012-05-03 DIAGNOSIS — Z79899 Other long term (current) drug therapy: Secondary | ICD-10-CM | POA: Diagnosis not present

## 2012-05-24 DIAGNOSIS — M47817 Spondylosis without myelopathy or radiculopathy, lumbosacral region: Secondary | ICD-10-CM | POA: Diagnosis not present

## 2012-05-24 DIAGNOSIS — I679 Cerebrovascular disease, unspecified: Secondary | ICD-10-CM | POA: Diagnosis not present

## 2012-08-01 DIAGNOSIS — I10 Essential (primary) hypertension: Secondary | ICD-10-CM | POA: Diagnosis not present

## 2012-08-01 DIAGNOSIS — J45909 Unspecified asthma, uncomplicated: Secondary | ICD-10-CM | POA: Diagnosis not present

## 2012-08-01 DIAGNOSIS — F329 Major depressive disorder, single episode, unspecified: Secondary | ICD-10-CM | POA: Diagnosis not present

## 2012-08-31 DIAGNOSIS — M47817 Spondylosis without myelopathy or radiculopathy, lumbosacral region: Secondary | ICD-10-CM | POA: Diagnosis not present

## 2012-08-31 DIAGNOSIS — I679 Cerebrovascular disease, unspecified: Secondary | ICD-10-CM | POA: Diagnosis not present

## 2012-10-11 DIAGNOSIS — I69993 Ataxia following unspecified cerebrovascular disease: Secondary | ICD-10-CM | POA: Diagnosis not present

## 2012-10-23 ENCOUNTER — Emergency Department (HOSPITAL_COMMUNITY): Payer: PRIVATE HEALTH INSURANCE

## 2012-10-23 ENCOUNTER — Emergency Department (HOSPITAL_COMMUNITY)
Admission: EM | Admit: 2012-10-23 | Discharge: 2012-10-23 | Disposition: A | Discharge status: Home / Self Care | Payer: PRIVATE HEALTH INSURANCE | Attending: Emergency Medicine | Admitting: Emergency Medicine

## 2012-10-23 ENCOUNTER — Encounter (HOSPITAL_COMMUNITY): Payer: Self-pay

## 2012-10-23 DIAGNOSIS — I10 Essential (primary) hypertension: Secondary | ICD-10-CM | POA: Diagnosis not present

## 2012-10-23 DIAGNOSIS — Z7982 Long term (current) use of aspirin: Secondary | ICD-10-CM | POA: Insufficient documentation

## 2012-10-23 DIAGNOSIS — F329 Major depressive disorder, single episode, unspecified: Secondary | ICD-10-CM | POA: Insufficient documentation

## 2012-10-23 DIAGNOSIS — Z79899 Other long term (current) drug therapy: Secondary | ICD-10-CM | POA: Diagnosis not present

## 2012-10-23 DIAGNOSIS — F3289 Other specified depressive episodes: Secondary | ICD-10-CM | POA: Insufficient documentation

## 2012-10-23 DIAGNOSIS — F411 Generalized anxiety disorder: Secondary | ICD-10-CM | POA: Insufficient documentation

## 2012-10-23 DIAGNOSIS — Y9389 Activity, other specified: Secondary | ICD-10-CM | POA: Insufficient documentation

## 2012-10-23 DIAGNOSIS — S2239XA Fracture of one rib, unspecified side, initial encounter for closed fracture: Secondary | ICD-10-CM | POA: Diagnosis not present

## 2012-10-23 DIAGNOSIS — Y929 Unspecified place or not applicable: Secondary | ICD-10-CM | POA: Insufficient documentation

## 2012-10-23 DIAGNOSIS — IMO0002 Reserved for concepts with insufficient information to code with codable children: Secondary | ICD-10-CM | POA: Insufficient documentation

## 2012-10-23 DIAGNOSIS — W010XXA Fall on same level from slipping, tripping and stumbling without subsequent striking against object, initial encounter: Secondary | ICD-10-CM | POA: Insufficient documentation

## 2012-10-23 DIAGNOSIS — S2231XA Fracture of one rib, right side, initial encounter for closed fracture: Secondary | ICD-10-CM

## 2012-10-23 HISTORY — DX: Essential (primary) hypertension: I10

## 2012-10-23 HISTORY — DX: Anxiety disorder, unspecified: F41.9

## 2012-10-23 HISTORY — DX: Depression, unspecified: F32.A

## 2012-10-23 HISTORY — DX: Major depressive disorder, single episode, unspecified: F32.9

## 2012-10-23 MED ORDER — HYDROCODONE-ACETAMINOPHEN 5-325 MG PO TABS
1.0000 | ORAL_TABLET | Freq: Once | ORAL | Status: AC
  Administered 2012-10-23: 1 via ORAL
  Filled 2012-10-23: qty 1

## 2012-10-23 MED ORDER — HYDROCODONE-ACETAMINOPHEN 5-325 MG PO TABS: ORAL_TABLET | ORAL | Status: DC

## 2012-10-23 NOTE — ED Notes (Signed)
Pt c/o right side rib pain x1 day. Pt states she slipped on ice yesterday and landed on her right side. Pt denies pain anywhere else.

## 2012-10-23 NOTE — ED Notes (Signed)
Pt fell yesterday on ice, cont. To have right side rib pain.

## 2012-10-23 NOTE — ED Notes (Signed)
RN at bedside

## 2012-10-23 NOTE — ED Provider Notes (Signed)
History     CSN: [id number]  Arrival date & time 10/23/12  1240   First MD Initiated Contact with Patient 10/23/12 1357      Chief Complaint  Patient presents with  . Fall     HPI Pt was seen at 1425.   Per pt, c/o gradual onset and persistence of constant right sided chest wall "pain" that began yesterday after she slipped and fell while walking on ice.  States she landed on her right side.  Pt only c/o right chest wall pain.  Denies hitting head, no LOC, no neck or back pain, no palpitations, no SOB/cough, no abd pain, no N/V/D, no prodromal symptoms before fall, no syncope.     Past Medical History  Diagnosis Date  . Hypertension   . Depression   . Anxiety     Past Surgical History  Procedure Laterality Date  . Abdominal surgery    . Appendectomy    . Back surgery    . Neck surgery    . Abdominal hysterectomy    . Tonsillectomy      History  Substance Use Topics  . Smoking status: Never Smoker   . Smokeless tobacco: Not on file  . Alcohol Use: No      Review of Systems ROS: Statement: All systems negative except as marked or noted in the HPI; Constitutional: Negative for fever and chills. ; ; Eyes: Negative for eye pain, redness and discharge. ; ; ENMT: Negative for ear pain, hoarseness, nasal congestion, sinus pressure and sore throat. ; ; Cardiovascular: Negative for chest pain, palpitations, diaphoresis, dyspnea and peripheral edema. ; ; Respiratory: Negative for cough, wheezing and stridor. ; ; Gastrointestinal: Negative for nausea, vomiting, diarrhea, abdominal pain, blood in stool, hematemesis, jaundice and rectal bleeding. . ; ; Genitourinary: Negative for dysuria, flank pain and hematuria. ; ; Musculoskeletal: +right chest wall pain. Negative for back pain and neck pain. Negative for swelling and deformity.; ; Skin: Negative for pruritus, rash, abrasions, blisters, bruising and skin lesion.; ; Neuro: Negative for headache, lightheadedness and neck stiffness.  Negative for weakness, altered level of consciousness , altered mental status, extremity weakness, paresthesias, involuntary movement, seizure and syncope.       Allergies  Shellfish allergy and Iohexol  Home Medications   Current Outpatient Rx  Name  Route  Sig  Dispense  Refill  . ALPRAZolam (XANAX) 0.5 MG tablet   Oral   Take 0.5 mg by mouth 3 (three) times daily as needed for anxiety.          Marland Kitchen aspirin 81 MG tablet   Oral   Take 81 mg by mouth daily.           . citalopram (CELEXA) 40 MG tablet   Oral   Take 40 mg by mouth daily.           Marland Kitchen diltiazem (CARDIZEM CD) 240 MG 24 hr capsule   Oral   Take 240 mg by mouth daily.           Marland Kitchen doxazosin (CARDURA) 2 MG tablet   Oral   Take 2 mg by mouth at bedtime.           Marland Kitchen FLUoxetine (PROZAC) 10 MG capsule   Oral   Take 10 mg by mouth daily.         . fluticasone (FLONASE) 50 MCG/ACT nasal spray   Nasal   Place 2 sprays into the nose daily.           Marland Kitchen  Fluticasone-Salmeterol (ADVAIR) 250-50 MCG/DOSE AEPB   Inhalation   Inhale 1 puff into the lungs as needed (Shortness of Breath).          . hydrochlorothiazide (HYDRODIURIL) 25 MG tablet   Oral   Take 25 mg by mouth daily.           Marland Kitchen HYDROcodone-acetaminophen (LORCET) 10-650 MG per tablet   Oral   Take 1 tablet by mouth 3 (three) times daily as needed for pain.          . Multiple Vitamin (MULTIVITAMIN WITH MINERALS) TABS   Oral   Take 1 tablet by mouth daily.         . Vitamins C E (VITAMIN C & E COMPLEX) 500-400 MG-UNIT CAPS   Oral   Take 1 tablet by mouth daily.           BP 159/89  Pulse 64  Temp(Src) 97.6 F (36.4 C) (Oral)  Resp 18  Ht 5\' 1"  (1.549 m)  Wt 181 lb (82.101 kg)  BMI 34.22 kg/m2  SpO2 95%  Physical Exam 1430: Physical examination:  Nursing notes reviewed; Vital signs and O2 SAT reviewed;  Constitutional: Well developed, Well nourished, Well hydrated, In no acute distress; Head:  Normocephalic,  atraumatic; Eyes: EOMI, PERRL, No scleral icterus; ENMT: Mouth and pharynx normal, Mucous membranes moist; Neck: Supple, Full range of motion, No lymphadenopathy; Cardiovascular: Regular rate and rhythm, No murmur, rub, or gallop; Respiratory: Breath sounds clear & equal bilaterally, No rales, rhonchi, wheezes.  Speaking full sentences with ease, Normal respiratory effort/excursion; Chest: +right lower anterior-lateral ribs tender to palp. No ecchymosis, no erythema, no soft tissue crepitus. Movement normal; Abdomen: Soft, Nontender, Nondistended, Normal bowel sounds; Genitourinary: No CVA tenderness; Spine:  No midline CS, TS, LS tenderness.;; Extremities: Pulses normal, Pelvis stable. No deformity. No tenderness, No edema, No calf edema or asymmetry.; Neuro: AA&Ox3, Major CN grossly intact.  Speech clear. No gross focal motor or sensory deficits in extremities.; Skin: Color normal, Warm, Dry.   ED Course  Procedures    MDM  MDM Reviewed: nursing note and vitals Interpretation: x-ray   Dg Ribs Unilateral W/chest Right 10/23/2012  *RADIOLOGY REPORT*  Clinical Data: Anterior right-sided pain.  Status post fall yesterday.  RIGHT RIBS AND CHEST - 3+ VIEW  Comparison: 09/24/2008  Findings: Frontal view of the chest demonstrates lower cervical spine fixation. Midline trachea.  Mild cardiomegaly. No pleural effusion or pneumothorax.  Mild scarring at the right lung base.  3 views of right-sided ribs.  Minimally displaced anterior right seventh rib fracture.  Appears new since the prior.  IMPRESSION: Anterolateral right seventh rib fracture, likely acute.  No pleural fluid or pneumothorax.   Original Report Authenticated By: Jeronimo Greaves, M.D.       1630:  Pt states she feels "much better" after meds and wants to go home now.  IS teach and treat performed by RT.  Sats remain 95-98% on R/A, resps without distress, abd remains benign.  Dx and testing d/w pt and family.  Questions answered.  Verb understanding,  agreeable to d/c home with outpt f/u.         Laray Anger, DO 10/25/12 1323

## 2012-11-22 ENCOUNTER — Telehealth: Payer: Self-pay | Admitting: Family Medicine

## 2012-11-22 NOTE — Telephone Encounter (Signed)
Discussed with patient. Patient scheduled office visit per doctors orders.

## 2012-11-22 NOTE — Telephone Encounter (Signed)
Need to know other meds tried in past, etc.

## 2012-11-22 NOTE — Telephone Encounter (Signed)
Cut back to 20mg . OV soon--please keep appt

## 2012-11-22 NOTE — Telephone Encounter (Signed)
Patient says that the Prozac that she has been on the last month has been making her throw up again when she takes it in the morning even when she eats.  She said it did this when she started taking it originally, but then she was advised to make sure she eats with it. Please advise.

## 2012-11-24 ENCOUNTER — Encounter: Payer: Self-pay | Admitting: *Deleted

## 2012-11-30 ENCOUNTER — Ambulatory Visit: Payer: Medicare Other | Admitting: Family Medicine

## 2012-12-22 DIAGNOSIS — M47817 Spondylosis without myelopathy or radiculopathy, lumbosacral region: Secondary | ICD-10-CM | POA: Diagnosis not present

## 2012-12-22 DIAGNOSIS — G8929 Other chronic pain: Secondary | ICD-10-CM | POA: Diagnosis not present

## 2013-03-03 ENCOUNTER — Other Ambulatory Visit: Payer: Self-pay | Admitting: Family Medicine

## 2013-03-09 ENCOUNTER — Ambulatory Visit (INDEPENDENT_AMBULATORY_CARE_PROVIDER_SITE_OTHER): Payer: Medicare Other | Admitting: Family Medicine

## 2013-03-09 ENCOUNTER — Encounter: Payer: Self-pay | Admitting: Family Medicine

## 2013-03-09 VITALS — BP 122/74 | Temp 98.1°F | Wt 193.0 lb

## 2013-03-09 DIAGNOSIS — R7301 Impaired fasting glucose: Secondary | ICD-10-CM

## 2013-03-09 DIAGNOSIS — I10 Essential (primary) hypertension: Secondary | ICD-10-CM

## 2013-03-09 DIAGNOSIS — J441 Chronic obstructive pulmonary disease with (acute) exacerbation: Secondary | ICD-10-CM

## 2013-03-09 DIAGNOSIS — F329 Major depressive disorder, single episode, unspecified: Secondary | ICD-10-CM

## 2013-03-09 DIAGNOSIS — E119 Type 2 diabetes mellitus without complications: Secondary | ICD-10-CM | POA: Diagnosis not present

## 2013-03-09 DIAGNOSIS — Z79899 Other long term (current) drug therapy: Secondary | ICD-10-CM

## 2013-03-09 DIAGNOSIS — F0390 Unspecified dementia without behavioral disturbance: Secondary | ICD-10-CM

## 2013-03-09 DIAGNOSIS — E785 Hyperlipidemia, unspecified: Secondary | ICD-10-CM

## 2013-03-09 DIAGNOSIS — F32A Depression, unspecified: Secondary | ICD-10-CM

## 2013-03-09 LAB — POCT GLYCOSYLATED HEMOGLOBIN (HGB A1C): Hemoglobin A1C: 5.2

## 2013-03-09 NOTE — Progress Notes (Signed)
  Subjective:    Patient ID: Nichole [name], female    DOB: [birth year], 67 y.o.   MRN: [id number]  HPI Patient arrives office for followup of multiple concerns. She has a history of hypertension. She claims compliance with her medications in this regard.  Patient also has history of some anxiety and depression. She claims compliance with her medication. No obvious side effects from it.  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  Patient has challenge with impaired fasting glucose. Trying to watch her diet.  Patient gives history of new diagnosis of dementia. This is been given via Dr. Tia Masker. Patient on sample medication but that does not remember name.  Patient compliant with medicine for breathing. States overall good when using Advair regularly.  Results for orders placed in visit on 03/09/13  POCT GLYCOSYLATED HEMOGLOBIN (HGB A1C)      Result Value Range   Hemoglobin A1C 5.2      Review of Systems No chest pain no abdominal pain no change in bowel habits no loss of consciousness occasional falls see prior note ROS otherwise negative    Objective:   Physical Exam   alert oriented no acute distress. Vitals stable. Lungs clear. Heart regular rate rhythm. HEENT normal. Ankles without edema.      Assessment & Plan:  Impression 1 hypertension stable #2 depression anxiety ongoing discussed. #3 COPD stable. #4 dementia new diagnosis per specialist. #5 impaired fasting glucose clinically stable discussed #6 hyperlipidemia status uncertain plan followup here in 3 months. Diet exercise discussed. Maintain same medications. Maintain followups with Dr. Channing Mutters. Appropriate blood work further recommendations based on results. WSL

## 2013-03-27 DIAGNOSIS — J441 Chronic obstructive pulmonary disease with (acute) exacerbation: Secondary | ICD-10-CM | POA: Insufficient documentation

## 2013-03-27 DIAGNOSIS — E785 Hyperlipidemia, unspecified: Secondary | ICD-10-CM | POA: Insufficient documentation

## 2013-03-27 DIAGNOSIS — F32A Depression, unspecified: Secondary | ICD-10-CM | POA: Insufficient documentation

## 2013-03-27 DIAGNOSIS — F329 Major depressive disorder, single episode, unspecified: Secondary | ICD-10-CM | POA: Insufficient documentation

## 2013-03-27 DIAGNOSIS — R7301 Impaired fasting glucose: Secondary | ICD-10-CM | POA: Insufficient documentation

## 2013-03-27 DIAGNOSIS — I10 Essential (primary) hypertension: Secondary | ICD-10-CM | POA: Insufficient documentation

## 2013-03-27 DIAGNOSIS — J449 Chronic obstructive pulmonary disease, unspecified: Secondary | ICD-10-CM | POA: Insufficient documentation

## 2013-03-27 DIAGNOSIS — F0390 Unspecified dementia without behavioral disturbance: Secondary | ICD-10-CM | POA: Insufficient documentation

## 2013-03-28 DIAGNOSIS — I679 Cerebrovascular disease, unspecified: Secondary | ICD-10-CM | POA: Diagnosis not present

## 2013-03-28 DIAGNOSIS — M47817 Spondylosis without myelopathy or radiculopathy, lumbosacral region: Secondary | ICD-10-CM | POA: Diagnosis not present

## 2013-04-03 ENCOUNTER — Other Ambulatory Visit: Payer: Self-pay | Admitting: Family Medicine

## 2013-04-13 ENCOUNTER — Ambulatory Visit (INDEPENDENT_AMBULATORY_CARE_PROVIDER_SITE_OTHER): Payer: Medicare Other | Admitting: Family Medicine

## 2013-04-13 ENCOUNTER — Encounter: Payer: Self-pay | Admitting: Family Medicine

## 2013-04-13 VITALS — BP 132/80 | Ht 60.0 in | Wt 194.6 lb

## 2013-04-13 DIAGNOSIS — F0390 Unspecified dementia without behavioral disturbance: Secondary | ICD-10-CM

## 2013-04-13 NOTE — Progress Notes (Signed)
  Subjective:    Patient ID: Nichole [name], female    DOB: [birth year], 67 y.o.   MRN: [id number]  HPI  Patient arrives with complaint of dizziness for years-worse lately.  Memory is terrible. Trouble hearing.  Has  dementia. Husband is not so sure about this diagnosis.  Sees dr Channing Mutters,. He did MRI. Dr. Tia Masker felt the patient had dementia and spoke with me I agreed with him. Dr. Danella Penton prefer to hold off on medication.  Husband is extremely concerned about his spouse, and feels surely there must be some medication etc. that can help her.       Review of Systems No chest pain no headache no abdominal pain no loss of consciousness no seizures ROS otherwise negative    Objective:   Physical Exam Exam unchanged       Assessment & Plan:  Impression early dementia primarily his physicals in discussion. 25 minutes spent most discussion. I do think patient represents a true early dementia. Husband would like further workup. This is reasonable. Press on with workup. Get MRI from [place].

## 2013-04-14 DIAGNOSIS — Z79899 Other long term (current) drug therapy: Secondary | ICD-10-CM | POA: Diagnosis not present

## 2013-04-28 ENCOUNTER — Other Ambulatory Visit: Payer: Self-pay | Admitting: Family Medicine

## 2013-05-15 ENCOUNTER — Encounter: Payer: Self-pay | Admitting: Neurology

## 2013-05-15 ENCOUNTER — Ambulatory Visit (INDEPENDENT_AMBULATORY_CARE_PROVIDER_SITE_OTHER): Payer: Medicare Other | Admitting: Neurology

## 2013-05-15 VITALS — Ht 61.5 in | Wt 184.0 lb

## 2013-05-15 DIAGNOSIS — E538 Deficiency of other specified B group vitamins: Secondary | ICD-10-CM

## 2013-05-15 DIAGNOSIS — R269 Unspecified abnormalities of gait and mobility: Secondary | ICD-10-CM

## 2013-05-15 DIAGNOSIS — E039 Hypothyroidism, unspecified: Secondary | ICD-10-CM | POA: Diagnosis not present

## 2013-05-15 DIAGNOSIS — R4189 Other symptoms and signs involving cognitive functions and awareness: Secondary | ICD-10-CM

## 2013-05-15 DIAGNOSIS — F09 Unspecified mental disorder due to known physiological condition: Secondary | ICD-10-CM | POA: Diagnosis not present

## 2013-05-15 DIAGNOSIS — R2681 Unsteadiness on feet: Secondary | ICD-10-CM | POA: Insufficient documentation

## 2013-05-15 MED ORDER — RIVASTIGMINE 4.6 MG/24HR TD PT24: 1.0000 | MEDICATED_PATCH | Freq: Every day | TRANSDERMAL | Status: DC

## 2013-05-15 NOTE — Patient Instructions (Signed)
Overall you are doing fairly well but I do want to suggest a few things today:   Remember to drink plenty of fluid, eat healthy meals and do not skip any meals. Try to eat protein with a every meal and eat a healthy snack such as fruit or nuts in between meals. Try to keep a regular sleep-wake schedule and try to exercise daily, particularly in the form of walking, 20-30 minutes a day, if you can.   As far as your medications are concerned, I would like to suggest starting a medication called Exelon 4.6mg . You will use one patch daily as instructed.   As far as diagnostic testing: I would like to check some blood work today.   Please have a copy of your brain MRI sent to our office.   I would like to see you back in 4 months, sooner if we need to. Please call us with any interim questions, concerns, problems, updates or refill requests.   Please also call us for any test results so we can go over those with you on the phone.  My clinical assistant and will answer any of your questions and relay your messages to me and also relay most of my messages to you.   Our phone number is [phone]. We also have an after hours call service for urgent matters and there is a physician on-call for urgent questions. For any emergencies you know to call 911 or go to the nearest emergency room
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
## 2013-05-20 ENCOUNTER — Encounter: Payer: Self-pay | Admitting: Neurology

## 2013-05-20 LAB — VITAMIN B12: Vitamin B-12: 769 pg/mL (ref 211–946)

## 2013-05-20 LAB — TSH: TSH: 1.79 u[IU]/mL (ref 0.450–4.500)

## 2013-05-20 LAB — VITAMIN B1, WHOLE BLOOD: Thiamine: 143.2 nmol/L (ref 66.5–200.0)

## 2013-05-20 LAB — METHYLMALONIC ACID, SERUM: Methylmalonic Acid: 165 nmol/L (ref 0–378)

## 2013-05-22 ENCOUNTER — Telehealth: Payer: Self-pay | Admitting: Family Medicine

## 2013-05-22 MED ORDER — DILTIAZEM HCL ER COATED BEADS 240 MG PO CP24: ORAL_CAPSULE | ORAL | Status: DC

## 2013-05-22 NOTE — Telephone Encounter (Signed)
Ok 11 ref 

## 2013-05-22 NOTE — Telephone Encounter (Signed)
RX sent to pharmacy. Patient was notified.

## 2013-05-22 NOTE — Telephone Encounter (Signed)
Patient needs Rx for diltiazem to [facility]-Please call when complete.

## 2013-06-05 ENCOUNTER — Other Ambulatory Visit: Payer: Self-pay | Admitting: Family Medicine

## 2013-06-05 ENCOUNTER — Telehealth: Payer: Self-pay | Admitting: Family Medicine

## 2013-06-05 MED ORDER — ALPRAZOLAM 1 MG PO TABS: 1.0000 mg | ORAL_TABLET | Freq: Three times a day (TID) | ORAL | Status: DC | PRN

## 2013-06-05 NOTE — Telephone Encounter (Signed)
Patient wants refill on xanax 0.5mg  has appointment in the morning.

## 2013-06-05 NOTE — Addendum Note (Signed)
Addended by: Dereck Ligas on: 06/05/2013 10:56 AM   Modules accepted: Orders

## 2013-06-05 NOTE — Telephone Encounter (Signed)
Ok mplus five monthly ref

## 2013-06-05 NOTE — Telephone Encounter (Signed)
Appointment scheduled for 06/06/13

## 2013-06-05 NOTE — Telephone Encounter (Signed)
Ok plus 5 monthly ref 

## 2013-06-05 NOTE — Telephone Encounter (Signed)
Medication was faxed to pharmacy. Patient was notified.

## 2013-06-05 NOTE — Telephone Encounter (Signed)
Already done

## 2013-06-05 NOTE — Telephone Encounter (Signed)
Mobic is in the RX request basket. Needs refill on xanax also. Patient last seen 04/13/13

## 2013-06-05 NOTE — Telephone Encounter (Signed)
Being handled through the RX request basket.

## 2013-06-06 ENCOUNTER — Ambulatory Visit: Payer: Medicare Other | Admitting: Family Medicine

## 2013-06-26 ENCOUNTER — Other Ambulatory Visit: Payer: Self-pay | Admitting: Family Medicine

## 2013-06-27 ENCOUNTER — Encounter: Payer: Self-pay | Admitting: Family Medicine

## 2013-06-27 ENCOUNTER — Ambulatory Visit (INDEPENDENT_AMBULATORY_CARE_PROVIDER_SITE_OTHER): Payer: Medicare Other | Admitting: Family Medicine

## 2013-06-27 VITALS — BP 130/80 | Temp 97.7°F | Ht 61.5 in | Wt 187.0 lb

## 2013-06-27 DIAGNOSIS — R109 Unspecified abdominal pain: Secondary | ICD-10-CM | POA: Diagnosis not present

## 2013-06-27 DIAGNOSIS — K5900 Constipation, unspecified: Secondary | ICD-10-CM

## 2013-06-27 DIAGNOSIS — Z23 Encounter for immunization: Secondary | ICD-10-CM | POA: Diagnosis not present

## 2013-06-27 MED ORDER — LACTULOSE 10 GM/15ML PO SOLN: ORAL | Status: DC

## 2013-06-27 NOTE — Progress Notes (Signed)
  Subjective:    Patient ID: Nichole [name], female    DOB: 1945/10/01, 67 y.o.   MRN: [id number]  Constipation This is a new problem. The current episode started more than 1 month ago. The problem has been gradually worsening since onset. Her stool frequency is 1 time per week or less. The stool is described as formed. Associated symptoms include abdominal pain and bloating. She has tried nothing for the symptoms. The treatment provided no relief.   Pt having trouble with constipation.no blood in stool. occas crampingan in and in patient states she is up-to-date and colonoscopy.  Has been started on a patch by the neurologist for her dementia. Unfortunately this costs $600 per month. States the constipation has been coming on for quite some time.  Review of Systems  Gastrointestinal: Positive for abdominal pain, constipation and bloating.   ROS otherwise negative     Objective:   Physical Exam Alert no apparent distress. Lungs clear. Heart regular rate and rhythm. Abdomen soft no rebound no guarding no masses       Assessment & Plan:  Impression 1 constipation discussed #2 progressive dementia discuss plan followup as scheduled. Trial of lactulose for constipation. Symptomatic care discussed. We will clarify last colonoscopy date. WSL

## 2013-07-21 ENCOUNTER — Other Ambulatory Visit: Payer: Self-pay | Admitting: *Deleted

## 2013-07-21 MED ORDER — LACTULOSE 10 GM/15ML PO SOLN: ORAL | Status: DC

## 2013-07-21 MED ORDER — HYDROCHLOROTHIAZIDE 25 MG PO TABS: ORAL_TABLET | ORAL | Status: DC

## 2013-07-21 MED ORDER — FLUOXETINE HCL 20 MG PO CAPS: ORAL_CAPSULE | ORAL | Status: DC

## 2013-07-21 MED ORDER — MELOXICAM 7.5 MG PO TABS: ORAL_TABLET | ORAL | Status: DC

## 2013-07-21 MED ORDER — DILTIAZEM HCL ER COATED BEADS 240 MG PO CP24: ORAL_CAPSULE | ORAL | Status: DC

## 2013-07-24 ENCOUNTER — Other Ambulatory Visit: Payer: Self-pay | Admitting: *Deleted

## 2013-07-24 MED ORDER — FLUTICASONE-SALMETEROL 250-50 MCG/DOSE IN AEPB: 1.0000 | INHALATION_SPRAY | RESPIRATORY_TRACT | Status: DC | PRN

## 2013-07-24 MED ORDER — FLUOXETINE HCL 20 MG PO CAPS: ORAL_CAPSULE | ORAL | Status: DC

## 2013-07-24 MED ORDER — DOXAZOSIN MESYLATE 2 MG PO TABS: 2.0000 mg | ORAL_TABLET | Freq: Every day | ORAL | Status: DC

## 2013-07-24 MED ORDER — HYDROCHLOROTHIAZIDE 25 MG PO TABS: ORAL_TABLET | ORAL | Status: DC

## 2013-07-24 MED ORDER — LACTULOSE 10 GM/15ML PO SOLN: ORAL | Status: DC

## 2013-07-24 MED ORDER — DILTIAZEM HCL ER COATED BEADS 240 MG PO CP24: ORAL_CAPSULE | ORAL | Status: DC

## 2013-07-24 MED ORDER — MELOXICAM 7.5 MG PO TABS: ORAL_TABLET | ORAL | Status: DC

## 2013-08-08 ENCOUNTER — Other Ambulatory Visit: Payer: Self-pay | Admitting: *Deleted

## 2013-08-08 MED ORDER — ALPRAZOLAM 1 MG PO TABS: ORAL_TABLET | ORAL | Status: DC

## 2013-08-23 ENCOUNTER — Other Ambulatory Visit: Payer: Self-pay | Admitting: *Deleted

## 2013-08-23 MED ORDER — ALBUTEROL SULFATE HFA 108 (90 BASE) MCG/ACT IN AERS: 2.0000 | INHALATION_SPRAY | Freq: Four times a day (QID) | RESPIRATORY_TRACT | Status: DC | PRN

## 2013-09-04 DIAGNOSIS — M47817 Spondylosis without myelopathy or radiculopathy, lumbosacral region: Secondary | ICD-10-CM | POA: Diagnosis not present

## 2013-09-04 DIAGNOSIS — I679 Cerebrovascular disease, unspecified: Secondary | ICD-10-CM | POA: Diagnosis not present

## 2013-09-14 ENCOUNTER — Ambulatory Visit: Payer: Medicare Other | Admitting: Neurology

## 2013-09-28 ENCOUNTER — Ambulatory Visit (INDEPENDENT_AMBULATORY_CARE_PROVIDER_SITE_OTHER): Payer: Medicare Other | Admitting: Family Medicine

## 2013-09-28 ENCOUNTER — Encounter: Payer: Self-pay | Admitting: Family Medicine

## 2013-09-28 VITALS — BP 128/82 | Ht 61.5 in | Wt 193.4 lb

## 2013-09-28 DIAGNOSIS — F329 Major depressive disorder, single episode, unspecified: Secondary | ICD-10-CM

## 2013-09-28 DIAGNOSIS — F3289 Other specified depressive episodes: Secondary | ICD-10-CM

## 2013-09-28 DIAGNOSIS — J441 Chronic obstructive pulmonary disease with (acute) exacerbation: Secondary | ICD-10-CM | POA: Diagnosis not present

## 2013-09-28 DIAGNOSIS — I10 Essential (primary) hypertension: Secondary | ICD-10-CM

## 2013-09-28 DIAGNOSIS — F32A Depression, unspecified: Secondary | ICD-10-CM

## 2013-09-28 DIAGNOSIS — F0390 Unspecified dementia without behavioral disturbance: Secondary | ICD-10-CM | POA: Diagnosis not present

## 2013-09-28 DIAGNOSIS — E785 Hyperlipidemia, unspecified: Secondary | ICD-10-CM

## 2013-09-28 NOTE — Progress Notes (Signed)
   Subjective:    Patient ID: Nichole BankerVivian L Minder, female    DOB: [birth year], 68 y.o.   MRN: [id number]  HPI Patient arrives for a follow up on blood pressure. Patient claims compliance with medication in this regard.  pain management.experiencing pain in left leg. Along with numbness. Followed by neurosur.  Takes occas hydroc for pain  Blood pressure is doing good.watching salt intake and trying to limit. Not exercising regularly.  Overall breathing is stable. Rare use of inhaler these days. Some shortness of breath with exertion. No chest pain.  Patient reports that her dementia is stable. She is no longer driving. The neurologist have her on a new medication for this and she does not remember the name.  Ongoing challenges with arthritis pain also. Primarily focused in the back.     Review of Systems No headache no chest pain no abdominal pain no change in bowel habits no rash no blood in stool ROS otherwise negative    Objective:   Physical Exam Alert no apparent distress. Vital stable. Blood pressure good on repeat. HEENT normal. Lungs clear. Heart regular in rhythm. Ankles without edema.       Assessment & Plan:  Impression 1 hypertension good control. #2 dementia ongoing. #3 depression patient states clinically stable. #4 arthritis ongoing pain. #5 COPD somewhat symptomatic but generally stable. Plan exercise encourage. Diet discussed. Maintain all same medications. Followup as scheduled. WSL

## 2013-10-27 ENCOUNTER — Telehealth: Payer: Self-pay | Admitting: Family Medicine

## 2013-10-27 NOTE — Telephone Encounter (Signed)
Pt notified she needs to call Dr. Channing Muttersoy for the med. He prescribes her pain med.

## 2013-10-27 NOTE — Telephone Encounter (Signed)
Patient called and said that she had [facility] over night her hydrocodone, but something happened in shipping and they notified her just now that she would not get this medicine until Monday. She is calling to find out if we can write her a prescription for about 6 pills to last her until Monday, but I told her that we do close at 5 and I am not sure how fast it will be answered if he even will write it. She needs to know ASAP so she can pick up before 5 if she can get a prescription.

## 2013-11-23 ENCOUNTER — Other Ambulatory Visit: Payer: Self-pay | Admitting: Family Medicine

## 2013-12-26 ENCOUNTER — Ambulatory Visit: Payer: Medicare Other | Admitting: Family Medicine

## 2014-01-04 ENCOUNTER — Encounter: Payer: Self-pay | Admitting: Family Medicine

## 2014-01-04 ENCOUNTER — Ambulatory Visit (INDEPENDENT_AMBULATORY_CARE_PROVIDER_SITE_OTHER): Payer: Medicare Other | Admitting: Family Medicine

## 2014-01-04 VITALS — BP 148/80 | Temp 98.2°F | Ht 61.5 in | Wt 184.0 lb

## 2014-01-04 DIAGNOSIS — J329 Chronic sinusitis, unspecified: Secondary | ICD-10-CM | POA: Diagnosis not present

## 2014-01-04 DIAGNOSIS — J441 Chronic obstructive pulmonary disease with (acute) exacerbation: Secondary | ICD-10-CM

## 2014-01-04 DIAGNOSIS — I10 Essential (primary) hypertension: Secondary | ICD-10-CM

## 2014-01-04 DIAGNOSIS — J310 Chronic rhinitis: Secondary | ICD-10-CM

## 2014-01-04 MED ORDER — CEPHALEXIN 500 MG PO CAPS: 500.0000 mg | ORAL_CAPSULE | Freq: Three times a day (TID) | ORAL | Status: DC

## 2014-01-04 NOTE — Progress Notes (Signed)
   Subjective:    Patient ID: Nichole [name], female    DOB: 03/07/1946, 68 y.o.   MRN: [id number]  Cough This is a new problem. Episode onset: Tuesday. The problem has been gradually worsening. The cough is non-productive. Associated symptoms include ear pain and a sore throat. Associated symptoms comments: Hoarse voice. Nothing aggravates the symptoms. She has tried nothing for the symptoms.   Pt was scheduled for 3 month check up. She would like to address her sickness. Pt does not need a refill on her meds. She has no new concerns.   Dementia seems stable per pt. During rough days tries not to do as much. Neuro started exelon patches, costed 750 dollars for her. Could not afford . Didn't seem to help much anyway.  Back pain still substantial. Dr. Tia Maskerowley has the patient on medication for this.  Not driving these days. The family supporting her.  Sleep is decent though not awesome   Review of Systems  HENT: Positive for ear pain and sore throat.   Respiratory: Positive for cough.    No abdominal pain no change about habits no blood in stool    Objective:   Physical Exam  Alert oriented x2 no acute distress vital stable. Lungs clear heart regular in rhythm. H&T moderate his congestion frontal tenderness abdomen soft neuro intact      Assessment & Plan:  Impression 1 dementia clinically stable #2 rhinosinusitis/bronchitis discussed #3 insomnia #4 social situation discussed plan antibiotics prescribed. Symptomatic care discussed. Continue management also from Dr. Tia Maskerowley and neurologist. Followup as scheduled. WSL

## 2014-01-12 ENCOUNTER — Other Ambulatory Visit: Payer: Self-pay | Admitting: Family Medicine

## 2014-02-26 ENCOUNTER — Telehealth: Payer: Self-pay | Admitting: Family Medicine

## 2014-02-26 ENCOUNTER — Other Ambulatory Visit: Payer: Self-pay | Admitting: *Deleted

## 2014-02-26 MED ORDER — DILTIAZEM HCL ER COATED BEADS 240 MG PO CP24
ORAL_CAPSULE | ORAL | Status: DC
Start: 2014-02-26 — End: 2014-03-29

## 2014-02-26 MED ORDER — DOXAZOSIN MESYLATE 2 MG PO TABS
2.0000 mg | ORAL_TABLET | Freq: Every day | ORAL | Status: DC
Start: 2014-02-26 — End: 2014-03-14

## 2014-02-26 MED ORDER — HYDROCHLOROTHIAZIDE 25 MG PO TABS: 25.0000 mg | ORAL_TABLET | Freq: Every day | ORAL | Status: DC

## 2014-02-26 MED ORDER — MELOXICAM 7.5 MG PO TABS: 7.5000 mg | ORAL_TABLET | Freq: Every day | ORAL | Status: DC

## 2014-02-26 NOTE — Telephone Encounter (Signed)
Last seen 01/04/14 for check up. How many refill on mobic can pt have.

## 2014-02-26 NOTE — Telephone Encounter (Signed)
Patient needs Rx for doxazosin (CARDURA) 2 MG tablet, hydrochlorothiazide (HYDRODIURIL) 25 MG tablet, diltiazem (CARDIZEM CD) 240 MG 24 hr capsule, meloxicam (MOBIC) 7.5 MG tablet. Please call when done.   [facility]

## 2014-02-26 NOTE — Telephone Encounter (Signed)
6 refills on the others , 3 on Mobic

## 2014-02-26 NOTE — Telephone Encounter (Signed)
meds sent to pharm. Pt notified.  

## 2014-02-28 ENCOUNTER — Telehealth: Payer: Self-pay | Admitting: Family Medicine

## 2014-02-28 NOTE — Telephone Encounter (Signed)
error 

## 2014-03-14 ENCOUNTER — Other Ambulatory Visit: Payer: Self-pay | Admitting: Family Medicine

## 2014-03-28 ENCOUNTER — Telehealth: Payer: Self-pay | Admitting: *Deleted

## 2014-03-28 MED ORDER — ALPRAZOLAM 1 MG PO TABS: ORAL_TABLET | ORAL | Status: DC

## 2014-03-28 NOTE — Telephone Encounter (Signed)
Refill on alprazolam 1mg . Last seen 01/04/14 fax #[phone]

## 2014-03-28 NOTE — Telephone Encounter (Signed)
Ok 6 mo worth total

## 2014-03-28 NOTE — Telephone Encounter (Signed)
Script faxed.

## 2014-03-29 ENCOUNTER — Other Ambulatory Visit: Payer: Self-pay | Admitting: *Deleted

## 2014-03-29 MED ORDER — DILTIAZEM HCL ER COATED BEADS 240 MG PO CP24: ORAL_CAPSULE | ORAL | Status: DC

## 2014-03-29 MED ORDER — MELOXICAM 7.5 MG PO TABS
7.5000 mg | ORAL_TABLET | Freq: Every day | ORAL | Status: DC
Start: 2014-03-29 — End: 2014-04-09

## 2014-03-29 MED ORDER — HYDROCHLOROTHIAZIDE 25 MG PO TABS: 25.0000 mg | ORAL_TABLET | Freq: Every day | ORAL | Status: DC

## 2014-04-06 ENCOUNTER — Encounter: Payer: Self-pay | Admitting: Family Medicine

## 2014-04-06 ENCOUNTER — Ambulatory Visit (INDEPENDENT_AMBULATORY_CARE_PROVIDER_SITE_OTHER): Payer: Medicare Other | Admitting: Family Medicine

## 2014-04-06 VITALS — BP 130/76 | Ht 61.5 in | Wt 174.1 lb

## 2014-04-06 DIAGNOSIS — E785 Hyperlipidemia, unspecified: Secondary | ICD-10-CM | POA: Diagnosis not present

## 2014-04-06 DIAGNOSIS — I10 Essential (primary) hypertension: Secondary | ICD-10-CM

## 2014-04-06 DIAGNOSIS — Z79899 Other long term (current) drug therapy: Secondary | ICD-10-CM | POA: Diagnosis not present

## 2014-04-06 DIAGNOSIS — F0390 Unspecified dementia without behavioral disturbance: Secondary | ICD-10-CM | POA: Diagnosis not present

## 2014-04-06 DIAGNOSIS — J441 Chronic obstructive pulmonary disease with (acute) exacerbation: Secondary | ICD-10-CM

## 2014-04-06 DIAGNOSIS — R7301 Impaired fasting glucose: Secondary | ICD-10-CM

## 2014-04-06 MED ORDER — HYDROCODONE-ACETAMINOPHEN 5-325 MG PO TABS: ORAL_TABLET | ORAL | Status: DC

## 2014-04-06 NOTE — Progress Notes (Signed)
   Subjective:    Patient ID: Nichole [name], female    DOB: 1945/09/02, 68 y.o.   MRN: [id number]  Hypertension This is a chronic problem. The current episode started more than 1 year ago. The problem has been gradually improving since onset. The problem is controlled. There are no associated agents to hypertension. There are no known risk factors for coronary artery disease. Treatments tried: hydrochlorothiazide. The current treatment provides significant improvement. There are no compliance problems.    Patient states she has been experiencing dizziness for some time now.   Pt when gets up, feels quite dizzy. Lasts for a few nmoments, no loss of consciousness.  Ongoing challenges with COPD. Worse with excessive exertion. Minimal to no wheezes however. No cough.  Review of Systems No headache no chest pain no back pain no abdominal pain no change in bowel habits no blood in stool ROS otherwise negative    Objective:   Physical Exam  Alert talkative no apparent distress. Blood pressure good on repeat. HEENT normal. Lungs diminished breath sounds. No wheezes no crackles heart regular in rhythm ankles trace edema blood pressure stable.      Assessment & Plan:  Impression 1 hypertension good control. #2 intermittent orthostatic hypotension not enough to warrant changes discussed. #3 COPD ongoing discussed #4 hyperlipidemia status uncertain plan as per orders. #5 dementia stable per patient Maintain same meds. Appropriate blood work. Further recommendations based results. Diet exercise discussed.

## 2014-04-09 ENCOUNTER — Other Ambulatory Visit: Payer: Self-pay | Admitting: Family Medicine

## 2014-04-30 ENCOUNTER — Other Ambulatory Visit: Payer: Self-pay | Admitting: Family Medicine

## 2014-05-08 ENCOUNTER — Ambulatory Visit: Payer: Medicare Other | Admitting: Family Medicine

## 2014-05-15 ENCOUNTER — Telehealth: Payer: Self-pay | Admitting: Family Medicine

## 2014-05-15 NOTE — Telephone Encounter (Signed)
Patient called to say she was sorry she missed her appointment but she just didn't remember and she wants to come in on the 5th of Oct when her husband sees dr. Lorin PicketScott at 8:10 . You only have same day left where can we put her in at if we can.

## 2014-05-15 NOTE — Telephone Encounter (Signed)
Pt is aware of appt at 9:30

## 2014-05-15 NOTE — Telephone Encounter (Signed)
Ok first same day then

## 2014-05-21 ENCOUNTER — Ambulatory Visit: Payer: Medicare Other | Admitting: Family Medicine

## 2014-05-21 ENCOUNTER — Ambulatory Visit (INDEPENDENT_AMBULATORY_CARE_PROVIDER_SITE_OTHER): Payer: Medicare Other | Admitting: *Deleted

## 2014-05-21 ENCOUNTER — Ambulatory Visit (INDEPENDENT_AMBULATORY_CARE_PROVIDER_SITE_OTHER): Payer: Medicare Other

## 2014-05-21 DIAGNOSIS — Z23 Encounter for immunization: Secondary | ICD-10-CM

## 2014-05-28 ENCOUNTER — Telehealth: Payer: Self-pay | Admitting: Family Medicine

## 2014-05-28 NOTE — Telephone Encounter (Signed)
Left message on voicemail notifying patient to wait til tomorrow

## 2014-05-28 NOTE — Telephone Encounter (Signed)
Wait til tomorrow

## 2014-05-28 NOTE — Telephone Encounter (Signed)
Last seen 04/06/14.

## 2014-05-28 NOTE — Telephone Encounter (Signed)
Patient needs Rx for HYDROcodone-acetaminophen (NORCO/VICODIN) 5-325 MG per tablet. She has an appointment tomorrow 05/29/14.

## 2014-05-29 ENCOUNTER — Emergency Department (HOSPITAL_COMMUNITY): Payer: No Typology Code available for payment source

## 2014-05-29 ENCOUNTER — Ambulatory Visit: Payer: Medicare Other | Admitting: Family Medicine

## 2014-05-29 ENCOUNTER — Inpatient Hospital Stay (HOSPITAL_COMMUNITY)
Admission: EM | Admit: 2014-05-29 | Discharge: 2014-06-08 | DRG: 493 | Disposition: A | Discharge status: Skilled Nursing Facility | Payer: No Typology Code available for payment source | Attending: General Surgery | Admitting: General Surgery

## 2014-05-29 ENCOUNTER — Encounter (HOSPITAL_COMMUNITY): Payer: Self-pay | Admitting: Emergency Medicine

## 2014-05-29 DIAGNOSIS — I10 Essential (primary) hypertension: Secondary | ICD-10-CM | POA: Diagnosis present

## 2014-05-29 DIAGNOSIS — S32019A Unspecified fracture of first lumbar vertebra, initial encounter for closed fracture: Secondary | ICD-10-CM | POA: Diagnosis present

## 2014-05-29 DIAGNOSIS — S199XXA Unspecified injury of neck, initial encounter: Secondary | ICD-10-CM | POA: Diagnosis not present

## 2014-05-29 DIAGNOSIS — R262 Difficulty in walking, not elsewhere classified: Secondary | ICD-10-CM | POA: Diagnosis not present

## 2014-05-29 DIAGNOSIS — R4182 Altered mental status, unspecified: Secondary | ICD-10-CM

## 2014-05-29 DIAGNOSIS — S92902D Unspecified fracture of left foot, subsequent encounter for fracture with routine healing: Secondary | ICD-10-CM | POA: Diagnosis not present

## 2014-05-29 DIAGNOSIS — E876 Hypokalemia: Secondary | ICD-10-CM | POA: Diagnosis not present

## 2014-05-29 DIAGNOSIS — S32011A Stable burst fracture of first lumbar vertebra, initial encounter for closed fracture: Secondary | ICD-10-CM | POA: Diagnosis not present

## 2014-05-29 DIAGNOSIS — S82202A Unspecified fracture of shaft of left tibia, initial encounter for closed fracture: Secondary | ICD-10-CM | POA: Diagnosis not present

## 2014-05-29 DIAGNOSIS — S82892A Other fracture of left lower leg, initial encounter for closed fracture: Principal | ICD-10-CM

## 2014-05-29 DIAGNOSIS — F419 Anxiety disorder, unspecified: Secondary | ICD-10-CM | POA: Diagnosis present

## 2014-05-29 DIAGNOSIS — S32009A Unspecified fracture of unspecified lumbar vertebra, initial encounter for closed fracture: Secondary | ICD-10-CM

## 2014-05-29 DIAGNOSIS — S8255XD Nondisplaced fracture of medial malleolus of left tibia, subsequent encounter for closed fracture with routine healing: Secondary | ICD-10-CM | POA: Diagnosis not present

## 2014-05-29 DIAGNOSIS — Z833 Family history of diabetes mellitus: Secondary | ICD-10-CM | POA: Diagnosis not present

## 2014-05-29 DIAGNOSIS — G8918 Other acute postprocedural pain: Secondary | ICD-10-CM | POA: Diagnosis not present

## 2014-05-29 DIAGNOSIS — S82872D Displaced pilon fracture of left tibia, subsequent encounter for closed fracture with routine healing: Secondary | ICD-10-CM | POA: Diagnosis not present

## 2014-05-29 DIAGNOSIS — S32010A Wedge compression fracture of first lumbar vertebra, initial encounter for closed fracture: Secondary | ICD-10-CM | POA: Diagnosis not present

## 2014-05-29 DIAGNOSIS — S32019D Unspecified fracture of first lumbar vertebra, subsequent encounter for fracture with routine healing: Secondary | ICD-10-CM | POA: Diagnosis not present

## 2014-05-29 DIAGNOSIS — S82252A Displaced comminuted fracture of shaft of left tibia, initial encounter for closed fracture: Secondary | ICD-10-CM | POA: Diagnosis not present

## 2014-05-29 DIAGNOSIS — G4730 Sleep apnea, unspecified: Secondary | ICD-10-CM | POA: Diagnosis present

## 2014-05-29 DIAGNOSIS — Z7982 Long term (current) use of aspirin: Secondary | ICD-10-CM | POA: Diagnosis not present

## 2014-05-29 DIAGNOSIS — F329 Major depressive disorder, single episode, unspecified: Secondary | ICD-10-CM | POA: Diagnosis present

## 2014-05-29 DIAGNOSIS — S32011D Stable burst fracture of first lumbar vertebra, subsequent encounter for fracture with routine healing: Secondary | ICD-10-CM | POA: Diagnosis not present

## 2014-05-29 DIAGNOSIS — J449 Chronic obstructive pulmonary disease, unspecified: Secondary | ICD-10-CM | POA: Diagnosis present

## 2014-05-29 DIAGNOSIS — S3991XA Unspecified injury of abdomen, initial encounter: Secondary | ICD-10-CM | POA: Diagnosis not present

## 2014-05-29 DIAGNOSIS — M6281 Muscle weakness (generalized): Secondary | ICD-10-CM | POA: Diagnosis not present

## 2014-05-29 DIAGNOSIS — I700 Atherosclerosis of aorta: Secondary | ICD-10-CM | POA: Diagnosis not present

## 2014-05-29 DIAGNOSIS — S92902A Unspecified fracture of left foot, initial encounter for closed fracture: Secondary | ICD-10-CM | POA: Diagnosis present

## 2014-05-29 DIAGNOSIS — J45909 Unspecified asthma, uncomplicated: Secondary | ICD-10-CM | POA: Diagnosis present

## 2014-05-29 DIAGNOSIS — R278 Other lack of coordination: Secondary | ICD-10-CM | POA: Diagnosis not present

## 2014-05-29 DIAGNOSIS — R339 Retention of urine, unspecified: Secondary | ICD-10-CM | POA: Diagnosis present

## 2014-05-29 DIAGNOSIS — Z86010 Personal history of colonic polyps: Secondary | ICD-10-CM

## 2014-05-29 DIAGNOSIS — R001 Bradycardia, unspecified: Secondary | ICD-10-CM | POA: Diagnosis present

## 2014-05-29 DIAGNOSIS — R293 Abnormal posture: Secondary | ICD-10-CM | POA: Diagnosis not present

## 2014-05-29 DIAGNOSIS — S92252A Displaced fracture of navicular [scaphoid] of left foot, initial encounter for closed fracture: Secondary | ICD-10-CM | POA: Diagnosis not present

## 2014-05-29 DIAGNOSIS — S8262XD Displaced fracture of lateral malleolus of left fibula, subsequent encounter for closed fracture with routine healing: Secondary | ICD-10-CM | POA: Diagnosis not present

## 2014-05-29 DIAGNOSIS — Z8249 Family history of ischemic heart disease and other diseases of the circulatory system: Secondary | ICD-10-CM

## 2014-05-29 DIAGNOSIS — S82302D Unspecified fracture of lower end of left tibia, subsequent encounter for closed fracture with routine healing: Secondary | ICD-10-CM | POA: Diagnosis not present

## 2014-05-29 DIAGNOSIS — S92252D Displaced fracture of navicular [scaphoid] of left foot, subsequent encounter for fracture with routine healing: Secondary | ICD-10-CM | POA: Diagnosis not present

## 2014-05-29 DIAGNOSIS — Z981 Arthrodesis status: Secondary | ICD-10-CM | POA: Diagnosis not present

## 2014-05-29 DIAGNOSIS — S93432D Sprain of tibiofibular ligament of left ankle, subsequent encounter: Secondary | ICD-10-CM | POA: Diagnosis not present

## 2014-05-29 DIAGNOSIS — S82899A Other fracture of unspecified lower leg, initial encounter for closed fracture: Secondary | ICD-10-CM | POA: Diagnosis not present

## 2014-05-29 DIAGNOSIS — S82899D Other fracture of unspecified lower leg, subsequent encounter for closed fracture with routine healing: Secondary | ICD-10-CM | POA: Diagnosis not present

## 2014-05-29 DIAGNOSIS — Z87891 Personal history of nicotine dependence: Secondary | ICD-10-CM

## 2014-05-29 DIAGNOSIS — F0390 Unspecified dementia without behavioral disturbance: Secondary | ICD-10-CM | POA: Diagnosis present

## 2014-05-29 DIAGNOSIS — S299XXA Unspecified injury of thorax, initial encounter: Secondary | ICD-10-CM | POA: Diagnosis not present

## 2014-05-29 DIAGNOSIS — E785 Hyperlipidemia, unspecified: Secondary | ICD-10-CM | POA: Diagnosis present

## 2014-05-29 DIAGNOSIS — S82209A Unspecified fracture of shaft of unspecified tibia, initial encounter for closed fracture: Secondary | ICD-10-CM

## 2014-05-29 DIAGNOSIS — S82872B Displaced pilon fracture of left tibia, initial encounter for open fracture type I or II: Secondary | ICD-10-CM | POA: Diagnosis not present

## 2014-05-29 DIAGNOSIS — J441 Chronic obstructive pulmonary disease with (acute) exacerbation: Secondary | ICD-10-CM | POA: Diagnosis not present

## 2014-05-29 DIAGNOSIS — M25551 Pain in right hip: Secondary | ICD-10-CM

## 2014-05-29 DIAGNOSIS — S79911A Unspecified injury of right hip, initial encounter: Secondary | ICD-10-CM | POA: Diagnosis not present

## 2014-05-29 DIAGNOSIS — R52 Pain, unspecified: Secondary | ICD-10-CM

## 2014-05-29 DIAGNOSIS — S0990XA Unspecified injury of head, initial encounter: Secondary | ICD-10-CM | POA: Diagnosis not present

## 2014-05-29 DIAGNOSIS — F339 Major depressive disorder, recurrent, unspecified: Secondary | ICD-10-CM | POA: Diagnosis not present

## 2014-05-29 LAB — CBC WITH DIFFERENTIAL/PLATELET
Basophils Absolute: 0 10*3/uL (ref 0.0–0.1)
Basophils Relative: 0 % (ref 0–1)
Eosinophils Absolute: 0.1 10*3/uL (ref 0.0–0.7)
Eosinophils Relative: 1 % (ref 0–5)
HCT: 38.7 % (ref 36.0–46.0)
Hemoglobin: 13 g/dL (ref 12.0–15.0)
Lymphocytes Relative: 16 % (ref 12–46)
Lymphs Abs: 1.5 10*3/uL (ref 0.7–4.0)
MCH: 28.8 pg (ref 26.0–34.0)
MCHC: 33.6 g/dL (ref 30.0–36.0)
MCV: 85.8 fL (ref 78.0–100.0)
Monocytes Absolute: 0.7 10*3/uL (ref 0.1–1.0)
Monocytes Relative: 7 % (ref 3–12)
Neutro Abs: 7.6 10*3/uL (ref 1.7–7.7)
Neutrophils Relative %: 76 % (ref 43–77)
Platelets: 282 10*3/uL (ref 150–400)
RBC: 4.51 MIL/uL (ref 3.87–5.11)
RDW: 13.1 % (ref 11.5–15.5)
WBC: 9.9 10*3/uL (ref 4.0–10.5)

## 2014-05-29 LAB — COMPREHENSIVE METABOLIC PANEL
ALT: 13 U/L (ref 0–35)
AST: 21 U/L (ref 0–37)
Albumin: 3.9 g/dL (ref 3.5–5.2)
Alkaline Phosphatase: 105 U/L (ref 39–117)
Anion gap: 11 (ref 5–15)
BUN: 20 mg/dL (ref 6–23)
CO2: 27 mEq/L (ref 19–32)
Calcium: 9.5 mg/dL (ref 8.4–10.5)
Chloride: 102 mEq/L (ref 96–112)
Creatinine, Ser: 0.65 mg/dL (ref 0.50–1.10)
GFR calc Af Amer: >90 mL/min (ref >90)
GFR calc non Af Amer: 88 mL/min — ABNORMAL LOW (ref >90)
Glucose, Bld: 153 mg/dL — ABNORMAL HIGH (ref 70–99)
Potassium: 3.2 mEq/L — ABNORMAL LOW (ref 3.7–5.3)
Sodium: 140 mEq/L (ref 137–147)
Total Bilirubin: 0.5 mg/dL (ref 0.3–1.2)
Total Protein: 6.9 g/dL (ref 6.0–8.3)

## 2014-05-29 LAB — TROPONIN I: Troponin I: <0.3 ng/mL (ref <0.30)

## 2014-05-29 LAB — URINALYSIS, ROUTINE W REFLEX MICROSCOPIC
Bilirubin Urine: NEGATIVE
Glucose, UA: NEGATIVE mg/dL
Hgb urine dipstick: NEGATIVE
Ketones, ur: NEGATIVE mg/dL
Leukocytes, UA: NEGATIVE
Nitrite: NEGATIVE
Protein, ur: NEGATIVE mg/dL
Specific Gravity, Urine: 1.025 (ref 1.005–1.030)
Urobilinogen, UA: 0.2 mg/dL (ref 0.0–1.0)
pH: 5.5 (ref 5.0–8.0)

## 2014-05-29 LAB — RAPID URINE DRUG SCREEN, HOSP PERFORMED
Amphetamines: NOT DETECTED
Barbiturates: NOT DETECTED
Benzodiazepines: POSITIVE — AB
Cocaine: NOT DETECTED
Opiates: POSITIVE — AB
Tetrahydrocannabinol: NOT DETECTED

## 2014-05-29 LAB — MRSA PCR SCREENING: MRSA by PCR: NEGATIVE

## 2014-05-29 LAB — ETHANOL: Alcohol, Ethyl (B): <11 mg/dL (ref 0–11)

## 2014-05-29 MED ORDER — SODIUM CHLORIDE 0.9 % IV SOLN
Freq: Once | INTRAVENOUS | Status: AC
  Administered 2014-05-29: 125 mL/h via INTRAVENOUS

## 2014-05-29 MED ORDER — HYDROMORPHONE HCL 1 MG/ML IJ SOLN
0.5000 mg | INTRAMUSCULAR | Status: DC | PRN
  Administered 2014-05-30 – 2014-05-31 (×2): 0.5 mg via INTRAVENOUS
  Filled 2014-05-29 (×2): qty 1

## 2014-05-29 MED ORDER — PANTOPRAZOLE SODIUM 40 MG IV SOLR
40.0000 mg | Freq: Every day | INTRAVENOUS | Status: DC
  Administered 2014-06-02 – 2014-06-03 (×2): 40 mg via INTRAVENOUS
  Filled 2014-05-29 (×10): qty 40

## 2014-05-29 MED ORDER — ONDANSETRON HCL 4 MG PO TABS: 4.0000 mg | ORAL_TABLET | Freq: Four times a day (QID) | ORAL | Status: DC | PRN

## 2014-05-29 MED ORDER — ALBUTEROL SULFATE (2.5 MG/3ML) 0.083% IN NEBU: 2.5000 mg | INHALATION_SOLUTION | Freq: Four times a day (QID) | RESPIRATORY_TRACT | Status: DC | PRN

## 2014-05-29 MED ORDER — HYDROMORPHONE HCL 1 MG/ML IJ SOLN
1.0000 mg | INTRAMUSCULAR | Status: DC | PRN
  Administered 2014-05-31 – 2014-06-01 (×3): 1 mg via INTRAVENOUS
  Filled 2014-05-29 (×6): qty 1

## 2014-05-29 MED ORDER — HYDROCHLOROTHIAZIDE 25 MG PO TABS
25.0000 mg | ORAL_TABLET | Freq: Every day | ORAL | Status: DC
  Administered 2014-05-30 – 2014-06-08 (×10): 25 mg via ORAL
  Filled 2014-05-29 (×12): qty 1

## 2014-05-29 MED ORDER — ENOXAPARIN SODIUM 40 MG/0.4ML ~~LOC~~ SOLN
40.0000 mg | SUBCUTANEOUS | Status: DC
  Administered 2014-05-30: 40 mg via SUBCUTANEOUS
  Filled 2014-05-29: qty 0.4

## 2014-05-29 MED ORDER — PANTOPRAZOLE SODIUM 40 MG PO TBEC
40.0000 mg | DELAYED_RELEASE_TABLET | Freq: Every day | ORAL | Status: DC
  Administered 2014-05-30 – 2014-06-07 (×7): 40 mg via ORAL
  Filled 2014-05-29 (×7): qty 1

## 2014-05-29 MED ORDER — ALBUTEROL SULFATE HFA 108 (90 BASE) MCG/ACT IN AERS: 2.0000 | INHALATION_SPRAY | Freq: Four times a day (QID) | RESPIRATORY_TRACT | Status: DC | PRN

## 2014-05-29 MED ORDER — POTASSIUM CHLORIDE IN NACL 20-0.9 MEQ/L-% IV SOLN
INTRAVENOUS | Status: DC
  Administered 2014-05-30 – 2014-06-03 (×8): via INTRAVENOUS
  Filled 2014-05-29 (×12): qty 1000

## 2014-05-29 MED ORDER — ONDANSETRON HCL 4 MG/2ML IJ SOLN
4.0000 mg | Freq: Four times a day (QID) | INTRAMUSCULAR | Status: DC | PRN
  Administered 2014-06-02: 4 mg via INTRAVENOUS
  Filled 2014-05-29: qty 2

## 2014-05-29 MED ORDER — HYDROMORPHONE HCL 1 MG/ML IJ SOLN
1.0000 mg | INTRAMUSCULAR | Status: DC | PRN
  Administered 2014-05-31 – 2014-06-03 (×13): 1 mg via INTRAVENOUS
  Filled 2014-05-29 (×10): qty 1

## 2014-05-29 MED ORDER — MOMETASONE FURO-FORMOTEROL FUM 100-5 MCG/ACT IN AERO
2.0000 | INHALATION_SPRAY | Freq: Two times a day (BID) | RESPIRATORY_TRACT | Status: DC
  Administered 2014-05-30 – 2014-06-07 (×16): 2 via RESPIRATORY_TRACT
  Filled 2014-05-29: qty 8.8

## 2014-05-29 NOTE — ED Notes (Addendum)
proteriorleft leg splint applied.

## 2014-05-29 NOTE — ED Notes (Signed)
Pt complaining of left foot pain
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
## 2014-05-29 NOTE — ED Notes (Signed)
Assisted MD to take pt of back board, Hard collar in place, pt placed on 2L 02 via [place]
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
## 2014-05-29 NOTE — ED Notes (Signed)
Lab at the bedside 

## 2014-05-29 NOTE — ED Notes (Signed)
Pt arrived via [facility] to [facility] via stretcher. Karrie DoffingM Scruggs, CHarge RN, advised she will Librarian, academicrequest secretary to call Magnus IvanBlackman, MD.
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
## 2014-05-29 NOTE — ED Notes (Signed)
Husband and carelink at the bedside
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
## 2014-05-29 NOTE — ED Notes (Signed)
Pt states she has had 3 back surgeries, denies back pain at this time

## 2014-05-30 ENCOUNTER — Encounter (HOSPITAL_COMMUNITY): Payer: Self-pay | Admitting: Radiology

## 2014-05-30 ENCOUNTER — Inpatient Hospital Stay (HOSPITAL_COMMUNITY): Payer: No Typology Code available for payment source

## 2014-05-30 LAB — BASIC METABOLIC PANEL
Anion gap: 14 (ref 5–15)
BUN: 14 mg/dL (ref 6–23)
CO2: 26 mEq/L (ref 19–32)
Calcium: 9.4 mg/dL (ref 8.4–10.5)
Chloride: 99 mEq/L (ref 96–112)
Creatinine, Ser: 0.5 mg/dL (ref 0.50–1.10)
GFR calc Af Amer: >90 mL/min (ref >90)
GFR calc non Af Amer: >90 mL/min (ref >90)
Glucose, Bld: 128 mg/dL — ABNORMAL HIGH (ref 70–99)
Potassium: 3 mEq/L — ABNORMAL LOW (ref 3.7–5.3)
Sodium: 139 mEq/L (ref 137–147)

## 2014-05-30 LAB — CBC
HCT: 38 % (ref 36.0–46.0)
Hemoglobin: 12.8 g/dL (ref 12.0–15.0)
MCH: 28.3 pg (ref 26.0–34.0)
MCHC: 33.7 g/dL (ref 30.0–36.0)
MCV: 84.1 fL (ref 78.0–100.0)
Platelets: 292 10*3/uL (ref 150–400)
RBC: 4.52 MIL/uL (ref 3.87–5.11)
RDW: 13.1 % (ref 11.5–15.5)
WBC: 10.6 10*3/uL — ABNORMAL HIGH (ref 4.0–10.5)

## 2014-05-30 MED ORDER — CHLORHEXIDINE GLUCONATE 0.12 % MT SOLN
15.0000 mL | Freq: Two times a day (BID) | OROMUCOSAL | Status: DC
  Administered 2014-05-30 – 2014-06-08 (×18): 15 mL via OROMUCOSAL
  Filled 2014-05-30 (×21): qty 15

## 2014-05-30 MED ORDER — TRAMADOL HCL 50 MG PO TABS
50.0000 mg | ORAL_TABLET | Freq: Four times a day (QID) | ORAL | Status: DC | PRN
  Administered 2014-06-01 – 2014-06-07 (×10): 100 mg via ORAL
  Filled 2014-05-30 (×10): qty 2

## 2014-05-30 MED ORDER — SODIUM CHLORIDE 0.9 % IJ SOLN
10.0000 mL | Freq: Two times a day (BID) | INTRAMUSCULAR | Status: DC
  Administered 2014-05-31 – 2014-06-03 (×7): 10 mL

## 2014-05-30 MED ORDER — SODIUM CHLORIDE 0.9 % IJ SOLN
10.0000 mL | INTRAMUSCULAR | Status: DC | PRN
  Administered 2014-06-04: 10 mL
  Administered 2014-06-04: 20 mL

## 2014-05-30 MED ORDER — CETYLPYRIDINIUM CHLORIDE 0.05 % MT LIQD
7.0000 mL | Freq: Two times a day (BID) | OROMUCOSAL | Status: DC
  Administered 2014-05-30 – 2014-06-08 (×14): 7 mL via OROMUCOSAL

## 2014-05-30 NOTE — Progress Notes (Signed)
Patient ID: Nichole BankerVivian L Brodersen, female   DOB: 1945-12-06, 68 y.o.   MRN: [id number] Navicular and calcaneus fractures will not require surgical fixation.

## 2014-05-30 NOTE — Progress Notes (Signed)
During initial assessment Mrs Nichole [name] was Alert and oriented on follow up patient is more confused, trying to get out of bed and  at times telling RN to "get out of my house" and wanting to call police. Patient has history of dementia, NT Sitter at the bedside.  Corliss SkainsJuan Rillie Riffel RN

## 2014-05-30 NOTE — Progress Notes (Signed)
UR completed.  Terez Freimark, RN BSN [facility] CCM Trauma/Neuro ICU Case Manager [phone]  

## 2014-05-30 NOTE — Consult Note (Signed)
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 and reactive to light.  Neck: Normal range of motion.  Respiratory: Effort normal.  GI: Soft.  Musculoskeletal: She exhibits tenderness.  Left leg splint sensation intact skin intact. Good cap refill  Skin: Skin is warm and dry. No rash noted. No erythema.    Assessment/Plan: Left Pilon fracture . Discussed plan for fixation reduction with plating , risks of skin problems , swelling , risks of infection , nonunion. Daughter at bedside , patient intermittently confused.   Procedure discussed all ?'s answered. Surgery Thursday at 6 PM,  Stop Lovenox tonite.           [phone]  Nichole [name] 05/30/2014, 6:22 PM
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
## 2014-05-30 NOTE — Progress Notes (Signed)
Pt unable to void even after attempt. Bladder scan indicated 998cc. Order received to place foley. Pt allergic to iodine, so good peri care was performed prior to insertion of 78F foley catheter.

## 2014-05-30 NOTE — Progress Notes (Signed)
Patient ID: Nichole [name], female   DOB: 04-01-1946, 68 y.o.   MRN: [id number] Patient unchanged today except for her confusion, which may represent her baseline.   At this time I think we should try to increase her activity and see how she can do without a large back fusion. Will assess her progress over time.

## 2014-05-30 NOTE — Progress Notes (Signed)
Peripherally Inserted Central Catheter/Midline Placement  The IV Nurse has discussed with the patient and/or persons authorized to consent for the patient, the purpose of this procedure and the potential benefits and risks involved with this procedure.  The benefits include less needle sticks, lab draws from the catheter and patient may be discharged home with the catheter.  Risks include, but not limited to, infection, bleeding, blood clot (thrombus formation), and puncture of an artery; nerve damage and irregular heat beat.  Alternatives to this procedure were also discussed.  PICC/Midline Placement Documentation    Information also given to husband Natividad Broodarl Harned .   Franne Gripewman, Jacquelinne Speak Renee 05/30/2014, 1:40 PM

## 2014-05-31 LAB — POCT I-STAT 4, (NA,K, GLUC, HGB,HCT)
Glucose, Bld: 102 mg/dL — ABNORMAL HIGH (ref 70–99)
HCT: 36 % (ref 36.0–46.0)
Hemoglobin: 12.2 g/dL (ref 12.0–15.0)
Potassium: 2.4 mEq/L — CL (ref 3.7–5.3)
Sodium: 139 mEq/L (ref 137–147)

## 2014-05-31 LAB — POTASSIUM: Potassium: 2.5 mEq/L — CL (ref 3.7–5.3)

## 2014-05-31 MED ORDER — ROCURONIUM BROMIDE 50 MG/5ML IV SOLN
INTRAVENOUS | Status: AC
  Filled 2014-05-31: qty 1

## 2014-05-31 MED ORDER — LACTATED RINGERS IV SOLN
INTRAVENOUS | Status: DC
  Administered 2014-05-31: 18:00:00 via INTRAVENOUS

## 2014-05-31 MED ORDER — MAGNESIUM SULFATE 40 MG/ML IJ SOLN
2.0000 g | Freq: Once | INTRAMUSCULAR | Status: AC
  Administered 2014-05-31: 2 g via INTRAVENOUS
  Filled 2014-05-31: qty 50

## 2014-05-31 MED ORDER — SUCCINYLCHOLINE CHLORIDE 20 MG/ML IJ SOLN
INTRAMUSCULAR | Status: AC
  Filled 2014-05-31: qty 1

## 2014-05-31 MED ORDER — HYDRALAZINE HCL 20 MG/ML IJ SOLN
10.0000 mg | Freq: Four times a day (QID) | INTRAMUSCULAR | Status: DC | PRN
  Administered 2014-06-01 – 2014-06-02 (×3): 10 mg via INTRAVENOUS
  Filled 2014-05-31 (×3): qty 1

## 2014-05-31 MED ORDER — POTASSIUM CHLORIDE 10 MEQ/100ML IV SOLN
10.0000 meq | INTRAVENOUS | Status: DC
  Administered 2014-05-31: 10 meq via INTRAVENOUS
  Filled 2014-05-31 (×2): qty 100

## 2014-05-31 MED ORDER — LIDOCAINE HCL (CARDIAC) 20 MG/ML IV SOLN
INTRAVENOUS | Status: AC
  Filled 2014-05-31: qty 5

## 2014-05-31 MED ORDER — POTASSIUM CHLORIDE CRYS ER 20 MEQ PO TBCR
60.0000 meq | EXTENDED_RELEASE_TABLET | Freq: Once | ORAL | Status: AC
  Administered 2014-05-31: 60 meq via ORAL
  Filled 2014-05-31: qty 3

## 2014-05-31 MED ORDER — POTASSIUM CHLORIDE 10 MEQ/50ML IV SOLN
10.0000 meq | INTRAVENOUS | Status: AC
  Administered 2014-05-31 (×2): 10 meq via INTRAVENOUS
  Filled 2014-05-31 (×2): qty 50

## 2014-05-31 MED ORDER — SODIUM CHLORIDE 0.9 % IV BOLUS (SEPSIS)
500.0000 mL | Freq: Once | INTRAVENOUS | Status: AC
  Administered 2014-05-31: 500 mL via INTRAVENOUS

## 2014-05-31 MED ORDER — FENTANYL CITRATE 0.05 MG/ML IJ SOLN
INTRAMUSCULAR | Status: AC
  Filled 2014-05-31: qty 5

## 2014-05-31 MED ORDER — PROPOFOL 10 MG/ML IV BOLUS
INTRAVENOUS | Status: AC
  Filled 2014-05-31: qty 20

## 2014-05-31 MED ORDER — POTASSIUM CHLORIDE CRYS ER 20 MEQ PO TBCR
20.0000 meq | EXTENDED_RELEASE_TABLET | Freq: Every day | ORAL | Status: DC
  Administered 2014-06-01 – 2014-06-04 (×3): 20 meq via ORAL
  Filled 2014-05-31 (×5): qty 1
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
## 2014-05-31 NOTE — Progress Notes (Signed)
Patient ID: Nichole [name], female   DOB: 04-13-1946, 68 y.o.   MRN: [id number] Patient has low potassium of 2.4.   Was 3.0 yesterday.  Anesthesia service unwilling to proceed due to patient safety concerns.  Will post for Monday .   Will need to be NPO after MN on Sunday and stop Lovenox  again Sunday night . Will try to get done Monday late Morning.

## 2014-05-31 NOTE — Progress Notes (Signed)
Patient ID: Nichole [name], female   DOB: 01-23-1946, 68 y.o.   MRN: [id number] Afeb, vss Remains pleasantly confused. She would love to avoid back surgery if possible. She is moving lower exteremities well. Once her ankle is fixed, we can start to increase activity and see what she can tolerate. She will ned a TLSO

## 2014-06-01 LAB — BASIC METABOLIC PANEL
Anion gap: 12 (ref 5–15)
BUN: 11 mg/dL (ref 6–23)
CO2: 27 mEq/L (ref 19–32)
Calcium: 8.8 mg/dL (ref 8.4–10.5)
Chloride: 101 mEq/L (ref 96–112)
Creatinine, Ser: 0.38 mg/dL — ABNORMAL LOW (ref 0.50–1.10)
GFR calc Af Amer: >90 mL/min (ref >90)
GFR calc non Af Amer: >90 mL/min (ref >90)
Glucose, Bld: 123 mg/dL — ABNORMAL HIGH (ref 70–99)
Potassium: 3.3 mEq/L — ABNORMAL LOW (ref 3.7–5.3)
Sodium: 140 mEq/L (ref 137–147)

## 2014-06-01 MED ORDER — ENOXAPARIN SODIUM 40 MG/0.4ML ~~LOC~~ SOLN
40.0000 mg | SUBCUTANEOUS | Status: DC
  Administered 2014-06-01 – 2014-06-08 (×7): 40 mg via SUBCUTANEOUS
  Filled 2014-06-01 (×8): qty 0.4
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
## 2014-06-01 NOTE — Progress Notes (Signed)
Orthopedic Tech Progress Note Patient Details:  Nichole [name] 05/01/1946 [id number] [employer] called brace Patient ID: Nichole [name], female   DOB: 05/01/1946, 68 y.o.   MRN: [id number]   [name] 06/01/2014, 11:59 AM

## 2014-06-01 NOTE — Progress Notes (Signed)
Patient ID: Nichole [name], female   DOB: 1945/12/05, 68 y.o.   MRN: [id number] Afeb, vss Has not had ankle surgery due to low K+ She seems more appropriate today. Moves extremities well We are still hoping to avoid surgery on her. Will start to mobilize once her ankle is fixed.

## 2014-06-02 LAB — BASIC METABOLIC PANEL
Anion gap: 10 (ref 5–15)
BUN: 9 mg/dL (ref 6–23)
CO2: 26 mEq/L (ref 19–32)
Calcium: 8.8 mg/dL (ref 8.4–10.5)
Chloride: 98 mEq/L (ref 96–112)
Creatinine, Ser: 0.34 mg/dL — ABNORMAL LOW (ref 0.50–1.10)
GFR calc Af Amer: >90 mL/min (ref >90)
GFR calc non Af Amer: >90 mL/min (ref >90)
Glucose, Bld: 98 mg/dL (ref 70–99)
Potassium: 3.4 mEq/L — ABNORMAL LOW (ref 3.7–5.3)
Sodium: 134 mEq/L — ABNORMAL LOW (ref 137–147)

## 2014-06-02 LAB — CBC
HCT: 36.7 % (ref 36.0–46.0)
Hemoglobin: 12.1 g/dL (ref 12.0–15.0)
MCH: 27.8 pg (ref 26.0–34.0)
MCHC: 33 g/dL (ref 30.0–36.0)
MCV: 84.2 fL (ref 78.0–100.0)
Platelets: 274 10*3/uL (ref 150–400)
RBC: 4.36 MIL/uL (ref 3.87–5.11)
RDW: 13.1 % (ref 11.5–15.5)
WBC: 8.9 10*3/uL (ref 4.0–10.5)

## 2014-06-02 MED ORDER — POTASSIUM CHLORIDE 10 MEQ/100ML IV SOLN
10.0000 meq | INTRAVENOUS | Status: AC
  Administered 2014-06-02 (×2): 10 meq via INTRAVENOUS
  Filled 2014-06-02 (×2): qty 100
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
## 2014-06-02 NOTE — Progress Notes (Signed)
Patient transferred from [facility] by Loraine LericheMark, RN and NT via bed on tele. Patient oriented to unit and room, instructed on callbell and placed at side. Bed alarm on. No family at bedside at this time. Will continue to monitor.
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
## 2014-06-03 MED ORDER — LACTULOSE 10 GM/15ML PO SOLN
10.0000 g | Freq: Every day | ORAL | Status: DC | PRN
  Filled 2014-06-03: qty 15

## 2014-06-03 MED ORDER — DOXAZOSIN MESYLATE 2 MG PO TABS
2.0000 mg | ORAL_TABLET | Freq: Every day | ORAL | Status: DC
  Administered 2014-06-03 – 2014-06-08 (×7): 2 mg via ORAL
  Filled 2014-06-03 (×6): qty 1

## 2014-06-03 MED ORDER — BISACODYL 10 MG RE SUPP: 10.0000 mg | Freq: Every day | RECTAL | Status: DC | PRN

## 2014-06-03 MED ORDER — DILTIAZEM HCL ER COATED BEADS 240 MG PO CP24
240.0000 mg | ORAL_CAPSULE | Freq: Every day | ORAL | Status: DC
  Administered 2014-06-03 – 2014-06-08 (×6): 240 mg via ORAL
  Filled 2014-06-03 (×6): qty 1

## 2014-06-03 MED ORDER — POTASSIUM CHLORIDE 10 MEQ/100ML IV SOLN
10.0000 meq | INTRAVENOUS | Status: AC
  Administered 2014-06-03 (×4): 10 meq via INTRAVENOUS
  Filled 2014-06-03: qty 100

## 2014-06-03 MED ORDER — SENNOSIDES-DOCUSATE SODIUM 8.6-50 MG PO TABS
1.0000 | ORAL_TABLET | Freq: Two times a day (BID) | ORAL | Status: DC
  Administered 2014-06-03 – 2014-06-08 (×11): 1 via ORAL
  Filled 2014-06-03 (×9): qty 1

## 2014-06-03 MED ORDER — POTASSIUM CHLORIDE CRYS ER 20 MEQ PO TBCR
40.0000 meq | EXTENDED_RELEASE_TABLET | Freq: Every day | ORAL | Status: DC
  Administered 2014-06-03 – 2014-06-08 (×6): 40 meq via ORAL
  Filled 2014-06-03 (×5): qty 2

## 2014-06-03 NOTE — Progress Notes (Signed)
Pt to TX to 5N-22, VSS, called report. Family aware of TX & surg Mon.
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
## 2014-06-03 NOTE — Progress Notes (Signed)
PT Cancellation Note  Patient Details Name: Jennette BankerVivian L Seguin MRN: [id number] DOB: 1945/11/18   Cancelled Treatment:    Reason Eval/Treat Not Completed: Patient not medically ready--remains on bedrest per Neuro MD note.  Will check back next date for medical readiness to mobilize.  Thanks,   Dennis BastMartin, Suresh Audi Galloway 06/03/2014, 9:29 AM
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
## 2014-06-04 ENCOUNTER — Encounter (HOSPITAL_COMMUNITY): Payer: Self-pay | Admitting: Certified Registered Nurse Anesthetist

## 2014-06-04 ENCOUNTER — Inpatient Hospital Stay (HOSPITAL_COMMUNITY): Payer: No Typology Code available for payment source | Admitting: Certified Registered"

## 2014-06-04 ENCOUNTER — Encounter (HOSPITAL_COMMUNITY): Admission: EM | Disposition: A | Discharge status: Skilled Nursing Facility | Payer: Self-pay | Source: Home / Self Care

## 2014-06-04 ENCOUNTER — Encounter (HOSPITAL_COMMUNITY): Payer: No Typology Code available for payment source | Admitting: Certified Registered"

## 2014-06-04 ENCOUNTER — Inpatient Hospital Stay (HOSPITAL_COMMUNITY): Payer: No Typology Code available for payment source

## 2014-06-04 HISTORY — PX: ORIF TIBIA FRACTURE: SHX5416

## 2014-06-04 LAB — BASIC METABOLIC PANEL
Anion gap: 11 (ref 5–15)
BUN: 10 mg/dL (ref 6–23)
CO2: 28 mEq/L (ref 19–32)
Calcium: 8.9 mg/dL (ref 8.4–10.5)
Chloride: 100 mEq/L (ref 96–112)
Creatinine, Ser: 0.36 mg/dL — ABNORMAL LOW (ref 0.50–1.10)
GFR calc Af Amer: >90 mL/min (ref >90)
GFR calc non Af Amer: >90 mL/min (ref >90)
Glucose, Bld: 88 mg/dL (ref 70–99)
Potassium: 3.7 mEq/L (ref 3.7–5.3)
Sodium: 139 mEq/L (ref 137–147)

## 2014-06-04 SURGERY — OPEN REDUCTION INTERNAL FIXATION (ORIF) TIBIA FRACTURE
Anesthesia: General | Site: Leg Lower | Laterality: Left

## 2014-06-04 MED ORDER — PROPOFOL 10 MG/ML IV BOLUS
INTRAVENOUS | Status: DC | PRN
  Administered 2014-06-04: 130 mg via INTRAVENOUS

## 2014-06-04 MED ORDER — EPHEDRINE SULFATE 50 MG/ML IJ SOLN
INTRAMUSCULAR | Status: DC | PRN
Start: 2014-06-04 — End: 2014-06-04
  Administered 2014-06-04 (×3): 5 mg via INTRAVENOUS

## 2014-06-04 MED ORDER — LACTATED RINGERS IV SOLN
INTRAVENOUS | Status: DC
  Administered 2014-06-04: 11:00:00 via INTRAVENOUS

## 2014-06-04 MED ORDER — FENTANYL CITRATE 0.05 MG/ML IJ SOLN
INTRAMUSCULAR | Status: DC | PRN
  Administered 2014-06-04 (×2): 25 ug via INTRAVENOUS
  Administered 2014-06-04: 50 ug via INTRAVENOUS
  Administered 2014-06-04 (×2): 25 ug via INTRAVENOUS
  Administered 2014-06-04: 100 ug via INTRAVENOUS
  Administered 2014-06-04 (×2): 25 ug via INTRAVENOUS

## 2014-06-04 MED ORDER — ROPIVACAINE HCL 5 MG/ML IJ SOLN
INTRAMUSCULAR | Status: DC | PRN
  Administered 2014-06-04: 30 mg via PERINEURAL

## 2014-06-04 MED ORDER — ONDANSETRON HCL 4 MG/2ML IJ SOLN
INTRAMUSCULAR | Status: AC
  Filled 2014-06-04: qty 2

## 2014-06-04 MED ORDER — PROMETHAZINE HCL 25 MG/ML IJ SOLN: 6.2500 mg | INTRAMUSCULAR | Status: DC | PRN

## 2014-06-04 MED ORDER — FENTANYL CITRATE 0.05 MG/ML IJ SOLN
INTRAMUSCULAR | Status: AC
  Filled 2014-06-04: qty 5

## 2014-06-04 MED ORDER — POTASSIUM CHLORIDE IN NACL 20-0.9 MEQ/L-% IV SOLN
INTRAVENOUS | Status: DC
  Administered 2014-06-04 – 2014-06-05 (×2): via INTRAVENOUS
  Filled 2014-06-04 (×3): qty 1000

## 2014-06-04 MED ORDER — ARTIFICIAL TEARS OP OINT
TOPICAL_OINTMENT | OPHTHALMIC | Status: AC
  Filled 2014-06-04: qty 3.5

## 2014-06-04 MED ORDER — FENTANYL CITRATE 0.05 MG/ML IJ SOLN
INTRAMUSCULAR | Status: AC
  Filled 2014-06-04: qty 2

## 2014-06-04 MED ORDER — BUPIVACAINE HCL (PF) 0.25 % IJ SOLN
INTRAMUSCULAR | Status: AC
  Filled 2014-06-04: qty 30

## 2014-06-04 MED ORDER — MIDAZOLAM HCL 5 MG/5ML IJ SOLN
INTRAMUSCULAR | Status: DC | PRN
  Administered 2014-06-04: 2 mg via INTRAVENOUS

## 2014-06-04 MED ORDER — CEFAZOLIN SODIUM-DEXTROSE 2-3 GM-% IV SOLR
INTRAVENOUS | Status: DC | PRN
  Administered 2014-06-04: 2 g via INTRAVENOUS

## 2014-06-04 MED ORDER — LIDOCAINE HCL (CARDIAC) 20 MG/ML IV SOLN
INTRAVENOUS | Status: AC
  Filled 2014-06-04: qty 5

## 2014-06-04 MED ORDER — OXYCODONE HCL 5 MG PO TABS: 15.0000 mg | ORAL_TABLET | ORAL | Status: DC | PRN

## 2014-06-04 MED ORDER — ONDANSETRON HCL 4 MG/2ML IJ SOLN
INTRAMUSCULAR | Status: DC | PRN
  Administered 2014-06-04: 4 mg via INTRAVENOUS

## 2014-06-04 MED ORDER — BUPIVACAINE HCL (PF) 0.25 % IJ SOLN
INTRAMUSCULAR | Status: DC | PRN
  Administered 2014-06-04: 5 mL

## 2014-06-04 MED ORDER — OXYCODONE HCL 5 MG PO TABS
15.0000 mg | ORAL_TABLET | ORAL | Status: DC | PRN
  Administered 2014-06-04 – 2014-06-07 (×7): 15 mg via ORAL
  Filled 2014-06-04 (×7): qty 3

## 2014-06-04 MED ORDER — PROPOFOL 10 MG/ML IV BOLUS
INTRAVENOUS | Status: AC
  Filled 2014-06-04: qty 20

## 2014-06-04 MED ORDER — NEOSTIGMINE METHYLSULFATE 10 MG/10ML IV SOLN
INTRAVENOUS | Status: AC
  Filled 2014-06-04: qty 1

## 2014-06-04 MED ORDER — MIDAZOLAM HCL 2 MG/2ML IJ SOLN
INTRAMUSCULAR | Status: AC
  Filled 2014-06-04: qty 2

## 2014-06-04 MED ORDER — HYDROMORPHONE HCL 1 MG/ML IJ SOLN: 0.2500 mg | INTRAMUSCULAR | Status: DC | PRN

## 2014-06-04 MED ORDER — ROCURONIUM BROMIDE 50 MG/5ML IV SOLN
INTRAVENOUS | Status: AC
  Filled 2014-06-04: qty 1

## 2014-06-04 MED ORDER — 0.9 % SODIUM CHLORIDE (POUR BTL) OPTIME
TOPICAL | Status: DC | PRN
  Administered 2014-06-04: 1000 mL

## 2014-06-04 MED ORDER — LACTATED RINGERS IV SOLN
INTRAVENOUS | Status: DC | PRN
  Administered 2014-06-04 (×2): via INTRAVENOUS

## 2014-06-04 MED ORDER — GLYCOPYRROLATE 0.2 MG/ML IJ SOLN
INTRAMUSCULAR | Status: AC
  Filled 2014-06-04: qty 3

## 2014-06-04 MED ORDER — ARTIFICIAL TEARS OP OINT
TOPICAL_OINTMENT | OPHTHALMIC | Status: DC | PRN
  Administered 2014-06-04: 1 via OPHTHALMIC

## 2014-06-04 MED ORDER — HYDROMORPHONE HCL 1 MG/ML IJ SOLN: 0.5000 mg | INTRAMUSCULAR | Status: DC | PRN

## 2014-06-04 SURGICAL SUPPLY — 76 items
BANDAGE ELASTIC 4 VELCRO ST LF (GAUZE/BANDAGES/DRESSINGS) ×2 IMPLANT
BANDAGE ELASTIC 6 VELCRO ST LF (GAUZE/BANDAGES/DRESSINGS) ×2 IMPLANT
BANDAGE ESMARK 6X9 LF (GAUZE/BANDAGES/DRESSINGS) IMPLANT
BIT DRILL 2.5X2.75 QC CALB (BIT) ×4 IMPLANT
BIT DRILL CALIBRATED 2.7 (BIT) ×2 IMPLANT
BNDG COHESIVE 4X5 TAN STRL (GAUZE/BANDAGES/DRESSINGS) ×2 IMPLANT
BNDG ESMARK 6X9 LF (GAUZE/BANDAGES/DRESSINGS)
BONE CHIP PRESERV 20CC (Bone Implant) ×2 IMPLANT
COVER MAYO STAND STRL (DRAPES) ×2 IMPLANT
COVER SURGICAL LIGHT HANDLE (MISCELLANEOUS) ×2 IMPLANT
CUFF TOURNIQUET SINGLE 34IN LL (TOURNIQUET CUFF) IMPLANT
CUFF TOURNIQUET SINGLE 44IN (TOURNIQUET CUFF) IMPLANT
DRAPE C-ARM 42X72 X-RAY (DRAPES) ×2 IMPLANT
DRAPE INCISE IOBAN 66X45 STRL (DRAPES) ×2 IMPLANT
DRAPE PROXIMA HALF (DRAPES) ×2 IMPLANT
DRAPE U-SHAPE 47X51 STRL (DRAPES) ×2 IMPLANT
DRSG PAD ABDOMINAL 8X10 ST (GAUZE/BANDAGES/DRESSINGS) ×4 IMPLANT
DURAPREP 26ML APPLICATOR (WOUND CARE) ×2 IMPLANT
ELECT REM PT RETURN 9FT ADLT (ELECTROSURGICAL) ×2
ELECTRODE REM PT RTRN 9FT ADLT (ELECTROSURGICAL) ×1 IMPLANT
GAUZE SPONGE 4X4 12PLY STRL (GAUZE/BANDAGES/DRESSINGS) ×2 IMPLANT
GAUZE XEROFORM 5X9 LF (GAUZE/BANDAGES/DRESSINGS) ×2 IMPLANT
GLOVE BIOGEL PI IND STRL 6.5 (GLOVE) ×1 IMPLANT
GLOVE BIOGEL PI IND STRL 7.5 (GLOVE) ×1 IMPLANT
GLOVE BIOGEL PI IND STRL 8 (GLOVE) ×1 IMPLANT
GLOVE BIOGEL PI INDICATOR 6.5 (GLOVE) ×1
GLOVE BIOGEL PI INDICATOR 7.5 (GLOVE) ×1
GLOVE BIOGEL PI INDICATOR 8 (GLOVE) ×1
GLOVE ECLIPSE 7.0 STRL STRAW (GLOVE) ×2 IMPLANT
GLOVE ORTHO TXT STRL SZ7.5 (GLOVE) ×2 IMPLANT
GOWN STRL REUS W/ TWL LRG LVL3 (GOWN DISPOSABLE) ×2 IMPLANT
GOWN STRL REUS W/ TWL XL LVL3 (GOWN DISPOSABLE) ×1 IMPLANT
GOWN STRL REUS W/TWL LRG LVL3 (GOWN DISPOSABLE) ×2
GOWN STRL REUS W/TWL XL LVL3 (GOWN DISPOSABLE) ×1
KIT BASIN OR (CUSTOM PROCEDURE TRAY) ×2 IMPLANT
KIT ROOM TURNOVER OR (KITS) ×2 IMPLANT
LOOP VESSEL MAXI BLUE (MISCELLANEOUS) ×2 IMPLANT
MANIFOLD NEPTUNE II (INSTRUMENTS) ×2 IMPLANT
NEEDLE HYPO 25GX1X1/2 BEV (NEEDLE) ×2 IMPLANT
NS IRRIG 1000ML POUR BTL (IV SOLUTION) ×2 IMPLANT
PACK ORTHO EXTREMITY (CUSTOM PROCEDURE TRAY) ×2 IMPLANT
PAD ABD 8X10 STRL (GAUZE/BANDAGES/DRESSINGS) ×2 IMPLANT
PAD ARMBOARD 7.5X6 YLW CONV (MISCELLANEOUS) ×4 IMPLANT
PAD CAST 4YDX4 CTTN HI CHSV (CAST SUPPLIES) ×1 IMPLANT
PADDING CAST COTTON 4X4 STRL (CAST SUPPLIES) ×1
PADDING CAST COTTON 6X4 STRL (CAST SUPPLIES) ×2 IMPLANT
PLATE 6H LT DIST ANTLAT TIB (Plate) ×2 IMPLANT
PLATE SPIDER 16 (Washer) ×2 IMPLANT
SCREW ACE CAN 4.0 50M (Screw) ×2 IMPLANT
SCREW CORTICAL 3.5MM  28MM (Screw) ×2 IMPLANT
SCREW CORTICAL 3.5MM 22MM (Screw) ×2 IMPLANT
SCREW CORTICAL 3.5MM 24MM (Screw) ×2 IMPLANT
SCREW CORTICAL 3.5MM 28MM (Screw) ×2 IMPLANT
SCREW CORTICAL 3.5MM 40MM (Screw) ×2 IMPLANT
SCREW LAG CANC FT 4X48 (Screw) ×2 IMPLANT
SCREW LOCK 3.5X32 DIST TIB (Orthopedic Implant) ×2 IMPLANT
SCREW LOCK CORT STAR 3.5X32 (Screw) ×2 IMPLANT
SCREW LOCK CORT STAR 3.5X44 (Screw) ×4 IMPLANT
SCREW LOCK CORT STAR 3.5X46 (Screw) ×2 IMPLANT
SCREW LOW PROFILE 3.5X48MM (Screw) ×2 IMPLANT
SCREW LP 3.5X44 (Screw) ×2 IMPLANT
SPONGE GAUZE 4X4 12PLY STER LF (GAUZE/BANDAGES/DRESSINGS) ×2 IMPLANT
SPONGE LAP 18X18 X RAY DECT (DISPOSABLE) ×2 IMPLANT
STAPLER VISISTAT 35W (STAPLE) ×4 IMPLANT
STOCKINETTE IMPERVIOUS 9X36 MD (GAUZE/BANDAGES/DRESSINGS) ×2 IMPLANT
SUCTION FRAZIER TIP 10 FR DISP (SUCTIONS) ×2 IMPLANT
SUT ETHILON 3 0 PS 1 (SUTURE) ×4 IMPLANT
SUT VIC AB 2-0 CT1 27 (SUTURE) ×2
SUT VIC AB 2-0 CT1 TAPERPNT 27 (SUTURE) ×2 IMPLANT
SYR CONTROL 10ML LL (SYRINGE) ×2 IMPLANT
TOWEL OR 17X24 6PK STRL BLUE (TOWEL DISPOSABLE) ×2 IMPLANT
TOWEL OR 17X26 10 PK STRL BLUE (TOWEL DISPOSABLE) ×4 IMPLANT
TUBE CONNECTING 12X1/4 (SUCTIONS) ×2 IMPLANT
WATER STERILE IRR 1000ML POUR (IV SOLUTION) ×2 IMPLANT
WIRE K 1.6MM 144256 (MISCELLANEOUS) ×6 IMPLANT
YANKAUER SUCT BULB TIP NO VENT (SUCTIONS) ×2 IMPLANT

## 2014-06-04 NOTE — Progress Notes (Signed)
PT Cancellation Note  Patient Details Name: Nichole [name] MRN: [id number] DOB: 11/24/1945   Cancelled Treatment:    Reason Eval/Treat Not Completed: Patient not medically ready  Continues to be on Bedrest, and noted plan for surgery today;   Will follow up for PT tomorrow;   Thank you,  Van ClinesHolly Dimitra Woodstock, PT  Acute Rehabilitation Services Pager [phone] Office [phone]     Van ClinesGarrigan, [name] 06/04/2014, 8:14 AM
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
## 2014-06-04 NOTE — Anesthesia Preprocedure Evaluation (Addendum)
Anesthesia Evaluation  Patient identified by MRN, date of birth, ID band Patient awake    Reviewed: Allergy & Precautions, H&P , NPO status , Patient's Chart, lab work & pertinent test results  History of Anesthesia Complications Negative for: history of anesthetic complications  Airway Mallampati: II TM Distance: >3 FB Neck ROM: Full    Dental  (+) Poor Dentition, Dental Advisory Given   Pulmonary asthma , sleep apnea , COPD COPD inhaler, former smoker,    Pulmonary exam normal       Cardiovascular hypertension, Pt. on medications     Neuro/Psych PSYCHIATRIC DISORDERS Anxiety Depression negative neurological ROS     GI/Hepatic negative GI ROS, Neg liver ROS,   Endo/Other  negative endocrine ROS  Renal/GU negative Renal ROS     Musculoskeletal   Abdominal   Peds  Hematology   Anesthesia Other Findings   Reproductive/Obstetrics                          Anesthesia Physical Anesthesia Plan  ASA: III  Anesthesia Plan: General   Post-op Pain Management:    Induction: Intravenous  Airway Management Planned: LMA  Additional Equipment:   Intra-op Plan:   Post-operative Plan: Extubation in OR  Informed Consent: I have reviewed the patients History and Physical, chart, labs and discussed the procedure including the risks, benefits and alternatives for the proposed anesthesia with the patient or authorized representative who has indicated his/her understanding and acceptance.   Dental advisory given  Plan Discussed with: CRNA, Anesthesiologist and Surgeon  Anesthesia Plan Comments:        Anesthesia Quick Evaluation

## 2014-06-04 NOTE — Anesthesia Postprocedure Evaluation (Signed)
Anesthesia Post Note  Patient: Nichole BankerVivian L Rathe  Procedure(s) Performed: Procedure(s) (LRB): OPEN REDUCTION INTERNAL FIXATION (ORIF) PILON FRACTURE AND OPEN REDUCTION INTERNAL FIXATION (ORIF) OF SYNDESMOTIC FRACTURE (Left)  Anesthesia type: general  Patient location: PACU  Post pain: Pain level controlled  Post assessment: Patient's Cardiovascular Status Stable  Last Vitals:  Filed Vitals:   06/04/14 1415  BP: 152/85  Pulse: 82  Temp:   Resp: 14    Post vital signs: Reviewed and stable  Level of consciousness: sedated  Complications: No apparent anesthesia complications

## 2014-06-04 NOTE — Progress Notes (Signed)
Orthopedic Tech Progress Note Patient Details:  Jennette BankerVivian L Bostick 19-Mar-1946 [id number] OHF applied to bed Patient ID: Jennette BankerVivian L Marmol, female   DOB: 19-Mar-1946, 68 y.o.   MRN: [id number]   [name] 06/04/2014, 4:03 PM

## 2014-06-04 NOTE — Progress Notes (Signed)
Patient ID: Nichole [name], female   DOB: 01/26/1946, 68 y.o.   MRN: [id number] She is apparently for ankle surgery today. Once that is done, we can try to start mobilizing and see if she can mange that.  Surgery is still not completely ruled out, but that will depend on how she can mobilize after her ankle is fixed. The back surgery would be fairly involved and with her baseline medical status, it would be better if we could avoid it.
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
## 2014-06-04 NOTE — Transfer of Care (Signed)
Immediate Anesthesia Transfer of Care Note  Patient: Nichole [name]  Procedure(s) Performed: Procedure(s) with comments: OPEN REDUCTION INTERNAL FIXATION (ORIF) PILON FRACTURE AND OPEN REDUCTION INTERNAL FIXATION (ORIF) OF SYNDESMOTIC FRACTURE (Left) - Big C-arm, Flat Jackson, Biomet Anterolateral plates, cancellous bone graft 20cc. Surgeon will call. Available after 5pm  Patient Location: PACU  Anesthesia Type:General  Level of Consciousness: awake, alert  and patient cooperative  Airway & Oxygen Therapy: Patient Spontanous Breathing and Patient connected to nasal cannula oxygen  Post-op Assessment: Report given to PACU RN, Post -op Vital signs reviewed and stable and Patient moving all extremities  Post vital signs: Reviewed and stable  Complications: No apparent anesthesia complications

## 2014-06-04 NOTE — Brief Op Note (Cosign Needed)
05/29/2014 - 06/04/2014  1:14 PM  PATIENT:  Nichole BankerVivian L Strough  68 y.o. female  PRE-OPERATIVE DIAGNOSIS:  Left Distal Tibia Pilon Fracture  POST-OPERATIVE DIAGNOSIS:  Left Distal Tibia Pilon Fracture  PROCEDURE:  Procedure(s) with comments: OPEN REDUCTION INTERNAL FIXATION (ORIF) PILON FRACTURE AND OPEN REDUCTION INTERNAL FIXATION (ORIF) OF SYNDESMOTIC FRACTURE (Left) - Big C-arm, Flat Jackson, Biomet Anterolateral plates, cancellous bone graft 20cc. Surgeon will call. Available after 5pm  SURGEON:  Surgeon(s) and Role:    * Eldred MangesMark C Yates, MD - Primary  PHYSICIAN ASSISTANT: Shahad Mazurek PAC  ASSISTANTS: none   ANESTHESIA:   general  EBL:  Total I/O In: 1000 [I.V.:1000] Out: 1420 [Urine:1400; Blood:20]  BLOOD ADMINISTERED:none  DRAINS: none   LOCAL MEDICATIONS USED:  MARCAINE     SPECIMEN:  No Specimen  DISPOSITION OF SPECIMEN:  N/A  COUNTS:  YES  TOURNIQUET:   Total Tourniquet Time Documented: Thigh (Left) - 82 minutes Total: Thigh (Left) - 82 minutes   DICTATION: .Note written in EPIC  PLAN OF CARE: Admit to inpatient   PATIENT DISPOSITION:  PACU - hemodynamically stable.   Delay start of Pharmacological VTE agent (>24hrs) due to surgical blood loss or risk of bleeding: yes

## 2014-06-04 NOTE — H&P (View-Only) (Signed)
Patient ID: Nichole [name], female   DOB: 02/17/1946, 68 y.o.   MRN: [id number] Patient has low potassium of 2.4.   Was 3.0 yesterday.  Anesthesia service unwilling to proceed due to patient safety concerns.  Will post for Monday .   Will need to be NPO after MN on Sunday and stop Lovenox  again Sunday night . Will try to get done Monday late Morning.  

## 2014-06-04 NOTE — Interval H&P Note (Signed)
History and Physical Interval Note:  06/04/2014 10:31 AM  Nichole [name]  has presented today for surgery, with the diagnosis of Left Distal Tibia Pilon Fracture  The various methods of treatment have been discussed with the patient and family. After consideration of risks, benefits and other options for treatment, the patient has consented to  Procedure(s) with comments: OPEN REDUCTION INTERNAL FIXATION (ORIF) TIBIA FRACTURE (Left) - Big C-arm, Flat Jackson, Biomet Anterolateral plates, cancellous bone graft 20cc. Surgeon will call. Available after 5pm as a surgical intervention .  The patient's history has been reviewed, patient examined, no change in status, stable for surgery.  I have reviewed the patient's chart and labs.  Questions were answered to the patient's satisfaction.     Momodou Consiglio C

## 2014-06-04 NOTE — Progress Notes (Signed)
OT Cancellation Note  Patient Details Name: Nichole [name] MRN: [id number] DOB: 02/07/1946   Cancelled Treatment:    Reason Eval/Treat Not Completed: Other (comment). Pt for surgery today and is still on bedrest.  Evette GeorgesLeonard, Mishika Flippen Eva [phone] 06/04/2014, 8:09 AM

## 2014-06-04 NOTE — Anesthesia Procedure Notes (Addendum)
Anesthesia Regional Block:  Popliteal block  Pre-Anesthetic Checklist: ,, timeout performed, Correct Patient, Correct Site, Correct Laterality, Correct Procedure, Correct Position, site marked, Risks and benefits discussed,  Surgical consent,  Pre-op evaluation,  At surgeon's request and post-op pain management  Laterality: Left  Prep: chloraprep       Needles:  Injection technique: Single-shot  Needle Type: Echogenic Stimulator Needle          Additional Needles:  Procedures: ultrasound guided (picture in chart) and nerve stimulator Popliteal block  Nerve Stimulator or Paresthesia:  Response: plantar flexion, 0.45 mA,   Additional Responses:   Narrative:  Start time: 06/04/2014 10:56 AM End time: 06/04/2014 11:06 AM Injection made incrementally with aspirations every 5 mL.  Performed by: Personally  Anesthesiologist: J. Adonis Hugueninan Singer, MD  Additional Notes: A functioning IV was confirmed and monitors were applied.  Sterile prep and drape, hand hygiene and sterile gloves were used.  Negative aspiration and test dose prior to incremental administration of local anesthetic. The patient tolerated the procedure well.Ultrasound  guidance: relevant anatomy identified, needle position confirmed, local anesthetic spread visualized around nerve(s), vascular puncture avoided.  Image printed for medical record.    Procedure Name: LMA Insertion Date/Time: 06/04/2014 11:17 AM Performed by: Angelica PouSMITH, Shiya Fogelman PIZZICARA Pre-anesthesia Checklist: Emergency Drugs available, Patient identified, Timeout performed, Suction available and Patient being monitored Patient Re-evaluated:Patient Re-evaluated prior to inductionOxygen Delivery Method: Circle system utilized Preoxygenation: Pre-oxygenation with 100% oxygen Intubation Type: IV induction Ventilation: Mask ventilation without difficulty LMA: LMA inserted LMA Size: 4.0 Number of attempts: 1 Placement Confirmation: breath sounds checked-  equal and bilateral and positive ETCO2 Tube secured with: Tape Dental Injury: Teeth and Oropharynx as per pre-operative assessment

## 2014-06-05 ENCOUNTER — Encounter (HOSPITAL_COMMUNITY): Payer: Self-pay | Admitting: Orthopaedic Surgery

## 2014-06-05 ENCOUNTER — Inpatient Hospital Stay (HOSPITAL_COMMUNITY): Payer: No Typology Code available for payment source

## 2014-06-05 LAB — BASIC METABOLIC PANEL
Anion gap: 12 (ref 5–15)
BUN: 8 mg/dL (ref 6–23)
CO2: 28 mEq/L (ref 19–32)
Calcium: 9 mg/dL (ref 8.4–10.5)
Chloride: 99 mEq/L (ref 96–112)
Creatinine, Ser: 0.36 mg/dL — ABNORMAL LOW (ref 0.50–1.10)
GFR calc Af Amer: >90 mL/min (ref >90)
GFR calc non Af Amer: >90 mL/min (ref >90)
Glucose, Bld: 110 mg/dL — ABNORMAL HIGH (ref 70–99)
Potassium: 3.6 mEq/L — ABNORMAL LOW (ref 3.7–5.3)
Sodium: 139 mEq/L (ref 137–147)

## 2014-06-05 LAB — CBC
HCT: 36.3 % (ref 36.0–46.0)
Hemoglobin: 12 g/dL (ref 12.0–15.0)
MCH: 28.2 pg (ref 26.0–34.0)
MCHC: 33.1 g/dL (ref 30.0–36.0)
MCV: 85.2 fL (ref 78.0–100.0)
Platelets: 369 10*3/uL (ref 150–400)
RBC: 4.26 MIL/uL (ref 3.87–5.11)
RDW: 13.3 % (ref 11.5–15.5)
WBC: 12.4 10*3/uL — ABNORMAL HIGH (ref 4.0–10.5)

## 2014-06-05 MED ORDER — SODIUM CHLORIDE 0.9 % IJ SOLN: 3.0000 mL | INTRAMUSCULAR | Status: DC | PRN

## 2014-06-05 MED ORDER — TAMSULOSIN HCL 0.4 MG PO CAPS
0.4000 mg | ORAL_CAPSULE | Freq: Every day | ORAL | Status: DC
  Administered 2014-06-05 – 2014-06-07 (×3): 0.4 mg via ORAL
  Filled 2014-06-05 (×3): qty 1

## 2014-06-05 MED ORDER — SODIUM CHLORIDE 0.9 % IJ SOLN: 3.0000 mL | Freq: Two times a day (BID) | INTRAMUSCULAR | Status: DC

## 2014-06-05 MED ORDER — SODIUM CHLORIDE 0.9 % IJ SOLN
10.0000 mL | INTRAMUSCULAR | Status: DC | PRN
  Administered 2014-06-05 – 2014-06-07 (×3): 20 mL

## 2014-06-05 MED ORDER — SODIUM CHLORIDE 0.9 % IJ SOLN
10.0000 mL | Freq: Two times a day (BID) | INTRAMUSCULAR | Status: DC
Start: 2014-06-05 — End: 2014-06-07

## 2014-06-05 NOTE — Progress Notes (Signed)
Check pelvis x-rays and start Flomax. Voiding trial tomorrow. Patient examined and I agree with the assessment and plan  Violeta GelinasBurke Bert Givans, MD, MPH, FACS Trauma: [phone] General Surgery: [phone]  06/05/2014 1:08 PM

## 2014-06-05 NOTE — Progress Notes (Signed)
Patient ID: Nichole [name], female   DOB: October 15, 1945, 68 y.o.   MRN: [id number] Afeb, vss No new neuro issues Done in xray at this time for eval of pelvis since she complains of bad iliac crest pain.  Once she is cleared in general, she can start trying to mobilize with PT so we can see how she progresses and we can decide if lumbar surgery will be needed.

## 2014-06-05 NOTE — Progress Notes (Signed)
Subjective: 1 Day Post-Op Procedure(s) (LRB): OPEN REDUCTION INTERNAL FIXATION (ORIF) PILON FRACTURE AND OPEN REDUCTION INTERNAL FIXATION (ORIF) OF SYNDESMOTIC FRACTURE (Left) Patient reports pain as moderate.  Post op foot hurts worse than L1 Fx.   Objective: Vital signs in last 24 hours: Temp:  [97.6 F (36.4 C)-98.9 F (37.2 C)] 98.4 F (36.9 C) (10/20 0439) Pulse Rate:  [71-96] 96 (10/20 0439) Resp:  [12-20] 20 (10/20 0439) BP: (136-192)/(75-119) 188/80 mmHg (10/20 0439) SpO2:  [94 %-100 %] 98 % (10/20 0439) FiO2 (%):  [21 %-28 %] 28 % (10/19 1858)  Intake/Output from previous day: 10/19 0701 - 10/20 0700 In: 1240 [P.O.:240; I.V.:1000] Out: 2920 [Urine:2900; Blood:20] Intake/Output this shift:     Recent Labs  06/05/14 0440  HGB 12.0    Recent Labs  06/05/14 0440  WBC 12.4*  RBC 4.26  HCT 36.3  PLT 369    Recent Labs  06/04/14 0500 06/05/14 0440  NA 139 139  K 3.7 3.6*  CL 100 99  CO2 28 28  BUN 10 8  CREATININE 0.36* 0.36*  GLUCOSE 88 110*  CALCIUM 8.9 9.0   No results found for this basename: LABPT, INR,  in the last 72 hours  Neurologically intact  Assessment/Plan: 1 Day Post-Op Procedure(s) (LRB): OPEN REDUCTION INTERNAL FIXATION (ORIF) PILON FRACTURE AND OPEN REDUCTION INTERNAL FIXATION (ORIF) OF SYNDESMOTIC FRACTURE (Left) Plan:    Will be NWB on left LE. ? Rehab vs SNF.  Will need TLSO before she gets up. Likely limited mobility with age weakness. She remains somewhat confused.   Nichole [name] C 06/05/2014, 7:22 AM
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
## 2014-06-05 NOTE — Progress Notes (Signed)
NWB to LLE, back precautions
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
## 2014-06-05 NOTE — Progress Notes (Signed)
New report of pain in right ilaccrest area. Painful to palpation and pain to sit up.  MD notifed. X-ray will be ordered.

## 2014-06-06 LAB — URINALYSIS, ROUTINE W REFLEX MICROSCOPIC
Bilirubin Urine: NEGATIVE
Glucose, UA: NEGATIVE mg/dL
Hgb urine dipstick: NEGATIVE
Ketones, ur: 40 mg/dL — AB
Nitrite: NEGATIVE
Protein, ur: NEGATIVE mg/dL
Specific Gravity, Urine: 1.012 (ref 1.005–1.030)
Urobilinogen, UA: 2 mg/dL — ABNORMAL HIGH (ref 0.0–1.0)
pH: 7.5 (ref 5.0–8.0)

## 2014-06-06 LAB — URINE MICROSCOPIC-ADD ON

## 2014-06-06 NOTE — Progress Notes (Signed)
OT Cancellation Note  Patient Details Name: Nichole BankerVivian L Jaimes MRN: [id number] DOB: 1945/11/22   Cancelled Treatment:    Reason Eval/Treat Not Completed: Other (comment) Pt is Medicare and current D/C plan is SNF. No apparent immediate acute care OT needs, therefore will defer OT to SNF. If OT eval is needed please call Acute Rehab Dept. at [phone] or text page OT at [phone].    Nena JordanMiller, Millette Halberstam M  Carney LivingLeeAnn Marie Hser [place]anger, OTR/L Occupational Therapist [phone] (pager)  06/06/2014, 9:42 AM

## 2014-06-06 NOTE — Clinical Social Work Note (Signed)
CSW attempted to meet with pt at bedside to complete assessment for possible SNF placement at time of discharge. Pt currently unwilling to participate in assessment and requested CSW to return at later time. CSW will attempt to contact pt's family and reassess pt at a later time.  Marcelline Deistmily Kincaid Tiger, LCSWA [phone]) Licensed Clinical Social Worker Orthopedics [phone]([id number]) and Surgical [phone]([id number])

## 2014-06-06 NOTE — Progress Notes (Signed)
Foley catheter removed this AM around 1000. Patient unable to void. Bladder scan done at 1800 showing 622 cc's of urine in bladder. I&O cath done at 1830 resulting in 700 cc's urine output. Will continue to monitor.

## 2014-06-06 NOTE — Progress Notes (Signed)
Patient ID: Nichole BankerVivian L Janish, female   DOB: 13-Jun-1946, 68 y.o.   MRN: [id number] Adfeb, vss No nerw neuro issues She seems ready to start to mobilize from my stand point. She is definitely in better spirits. She does not seem to be in a lot of pain. I spoke to her daughter and grandson yesterday. They would certainly like to avoid surgery if possible, so we will try to mobilize her without for now and see how she does.
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
## 2014-06-07 MED ORDER — HYDROMORPHONE HCL 1 MG/ML IJ SOLN: 0.5000 mg | INTRAMUSCULAR | Status: DC | PRN

## 2014-06-07 MED ORDER — HYDROCODONE-ACETAMINOPHEN 5-325 MG PO TABS
1.0000 | ORAL_TABLET | Freq: Four times a day (QID) | ORAL | Status: DC | PRN
  Administered 2014-06-08 (×3): 1 via ORAL
  Filled 2014-06-07 (×3): qty 1

## 2014-06-07 MED ORDER — OXYCODONE HCL 5 MG PO TABS
5.0000 mg | ORAL_TABLET | ORAL | Status: DC | PRN
  Administered 2014-06-07: 15 mg via ORAL
  Filled 2014-06-07: qty 3

## 2014-06-07 MED ORDER — TRAMADOL HCL 50 MG PO TABS
100.0000 mg | ORAL_TABLET | Freq: Four times a day (QID) | ORAL | Status: DC
  Administered 2014-06-08: 100 mg via ORAL
  Filled 2014-06-07 (×2): qty 2

## 2014-06-07 NOTE — Clinical Social Work Placement (Addendum)
Clinical Social Work Department CLINICAL SOCIAL WORK PLACEMENT NOTE 06/07/2014  Patient:  Nichole BankerWALKER,[name]  Account Number:  [id number] Admit date:  05/29/2014  Clinical Social Worker:  Mosie EpsteinEMILY S Airi Copado, LCSWA  Date/time:  06/07/2014 12:24 PM  Clinical Social Work is seeking post-discharge placement for this patient at the following level of care:   SKILLED NURSING   (*CSW will update this form in Epic as items are completed)   06/07/2014  Patient/family provided with [name] Department of Clinical Social Work's list of facilities offering this level of care within the geographic area requested by the patient (or if unable, by the patient's family).  06/07/2014  Patient/family informed of their freedom to choose among providers that offer the needed level of care, that participate in Medicare, Medicaid or managed care program needed by the patient, have an available bed and are willing to accept the patient.  06/07/2014  Patient/family informed of [facility]' ownership interest in [facility], as well as of the fact that they are under no obligation to receive care at this facility.  PASARR submitted to EDS on 06/07/2014 PASARR number received on 06/07/2014  FL2 transmitted to all facilities in geographic area requested by pt/family on  06/07/2014 FL2 transmitted to all facilities within larger geographic area on   Patient informed that his/her managed care company has contracts with or will negotiate with  certain facilities, including the following:     Patient/family informed of bed offers received:  06/07/2014 [facility] Physician recommends and patient chooses bed at    Patient to be transferred [facility]  [facility] on  06/08/2014 Patient to be transferred to facility by [facility] Patient and family notified of transfer on 06/08/2014 Name of family member notified:  Pt's daughter, [name], updated regarding discharge.  The  following physician request were entered in Epic:   Additional Comments:  Lily Kochermily Rhea Thrun, LCSWA [phone]) Licensed Clinical Social Worker Orthopedics [phone]([id number]) and Surgical [phone]([id number])
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
## 2014-06-07 NOTE — Progress Notes (Signed)
Patient smells of urine.  Confused  Needs SNF placement.  This patient has been seen and I agree with the findings and treatment plan.  Marta LamasJames O. Gae BonWyatt, III, MD, FACS [phone] (pager) [phone] (direct pager) Trauma Surgeon

## 2014-06-07 NOTE — Progress Notes (Signed)
Patient voiding. Multiple incontinence episode during the night.

## 2014-06-07 NOTE — Progress Notes (Signed)
R PICC D/C per MD order/38cm intact/vaseline pressure gauze applied/pressure x5 min/no bleeding noted. Ronette DeterJessica Poff, RN

## 2014-06-07 NOTE — Clinical Social Work Psychosocial (Signed)
Clinical Social Work Department BRIEF PSYCHOSOCIAL ASSESSMENT 06/07/2014  Patient:  Nichole [name],Nichole [name]     Account Number:  [id number]     Admit date:  05/29/2014  Clinical Social Worker:  Mosie EpsteinVAUGHN,Menashe Kafer S, LCSWA  Date/Time:  06/07/2014 12:19 PM  Referred by:  Physician  Date Referred:  06/06/2014 Referred for  SNF Placement   Other Referral:   none.   Interview type:  Family Other interview type:   CSW spoke with pt's husband, Nichole [name], regarding discharge disposition.    PSYCHOSOCIAL DATA Living Status:  HUSBAND Admitted from facility:   Level of care:   Primary support name:  Nichole [name] Primary support relationship to patient:  SPOUSE Degree of support available:   Strong support system.    CURRENT CONCERNS Current Concerns  Post-Acute Placement   Other Concerns:   none.    SOCIAL WORK ASSESSMENT / PLAN CSW attempted to meet with pt at bedside to discuss discharge disposition. Pt declined to speak with CSW and deferred conversation to pt's family. CSW obtained pt's husband work number [phone] from [employer].    CSW spoke with pt's husband regarding discharge disposition. Pt's husband stated he would like to discuss discharge disposition with pt's daughter and will return CSW call once discussing it. Pt's husband understanding pt is medically stable for discharge.    Pt's husband provided with bed offers and discussing discharge disposition with pt's daughter.   Assessment/plan status:  Psychosocial Support/Ongoing Assessment of Needs Other assessment/ plan:   none.   Information/referral to community resources:   [facility].    PATIENT'S/FAMILY'S RESPONSE TO PLAN OF CARE: Pt's husband understanding of CSW plan of care, and discussing with pt's daughter discharge disposition (SNF versus home with home health). Pt's husband expressed no further questions or concerns at this time.       Nichole Deistmily Desiree [name], LCSWA [phone]) Licensed Clinical  Social Worker Orthopedics [phone](5N17-32) and Surgical [phone]([id number])

## 2014-06-07 NOTE — Progress Notes (Signed)
Subjective: 3 Days Post-Op Procedure(s) (LRB): OPEN REDUCTION INTERNAL FIXATION (ORIF) PILON FRACTURE AND OPEN REDUCTION INTERNAL FIXATION (ORIF) OF SYNDESMOTIC FRACTURE (Left) Patient reports pain as mild.    Objective: Vital signs in last 24 hours: Temp:  [97.6 F (36.4 C)-98.2 F (36.8 C)] 98.2 F (36.8 C) (10/22 0527) Pulse Rate:  [78-81] 81 (10/22 0527) Resp:  [18] 18 (10/22 0527) BP: (149-162)/(61-79) 153/77 mmHg (10/22 0527) SpO2:  [94 %-98 %] 96 % (10/22 0527)  Intake/Output from previous day: 10/21 0701 - 10/22 0700 In: 240 [P.O.:220; I.V.:20] Out: 1100 [Urine:1100] Intake/Output this shift:     Recent Labs  06/05/14 0440  HGB 12.0    Recent Labs  06/05/14 0440  WBC 12.4*  RBC 4.26  HCT 36.3  PLT 369    Recent Labs  06/05/14 0440  NA 139  K 3.6*  CL 99  CO2 28  BUN 8  CREATININE 0.36*  GLUCOSE 110*  CALCIUM 9.0   No results found for this basename: LABPT, INR,  in the last 72 hours  Neurologically intact  Assessment/Plan: 3 Days Post-Op Procedure(s) (LRB): OPEN REDUCTION INTERNAL FIXATION (ORIF) PILON FRACTURE AND OPEN REDUCTION INTERNAL FIXATION (ORIF) OF SYNDESMOTIC FRACTURE (Left) Up with therapy  FL2 signed ,   SNF .    Will go into short leg cast next week . If D/Ced to SNF I need to see her next week in my office.   Peyson Postema C 06/07/2014, 7:48 AM

## 2014-06-08 DIAGNOSIS — F339 Major depressive disorder, recurrent, unspecified: Secondary | ICD-10-CM | POA: Diagnosis not present

## 2014-06-08 DIAGNOSIS — S82892A Other fracture of left lower leg, initial encounter for closed fracture: Secondary | ICD-10-CM

## 2014-06-08 DIAGNOSIS — S343XXS Injury of cauda equina, sequela: Secondary | ICD-10-CM | POA: Diagnosis not present

## 2014-06-08 DIAGNOSIS — S8252XA Displaced fracture of medial malleolus of left tibia, initial encounter for closed fracture: Secondary | ICD-10-CM | POA: Diagnosis not present

## 2014-06-08 DIAGNOSIS — S32019D Unspecified fracture of first lumbar vertebra, subsequent encounter for fracture with routine healing: Secondary | ICD-10-CM | POA: Diagnosis not present

## 2014-06-08 DIAGNOSIS — R100 Acute abdomen: Secondary | ICD-10-CM | POA: Diagnosis not present

## 2014-06-08 DIAGNOSIS — K5660 Unspecified intestinal obstruction: Secondary | ICD-10-CM | POA: Diagnosis not present

## 2014-06-08 DIAGNOSIS — S32011D Stable burst fracture of first lumbar vertebra, subsequent encounter for fracture with routine healing: Secondary | ICD-10-CM | POA: Diagnosis not present

## 2014-06-08 DIAGNOSIS — R1011 Right upper quadrant pain: Secondary | ICD-10-CM | POA: Diagnosis not present

## 2014-06-08 DIAGNOSIS — J441 Chronic obstructive pulmonary disease with (acute) exacerbation: Secondary | ICD-10-CM | POA: Diagnosis not present

## 2014-06-08 DIAGNOSIS — R293 Abnormal posture: Secondary | ICD-10-CM | POA: Diagnosis not present

## 2014-06-08 DIAGNOSIS — M6281 Muscle weakness (generalized): Secondary | ICD-10-CM | POA: Diagnosis not present

## 2014-06-08 DIAGNOSIS — S8255XD Nondisplaced fracture of medial malleolus of left tibia, subsequent encounter for closed fracture with routine healing: Secondary | ICD-10-CM | POA: Diagnosis not present

## 2014-06-08 DIAGNOSIS — S92902A Unspecified fracture of left foot, initial encounter for closed fracture: Secondary | ICD-10-CM

## 2014-06-08 DIAGNOSIS — K2970 Gastritis, unspecified, without bleeding: Secondary | ICD-10-CM | POA: Diagnosis not present

## 2014-06-08 DIAGNOSIS — R05 Cough: Secondary | ICD-10-CM | POA: Diagnosis not present

## 2014-06-08 DIAGNOSIS — R32 Unspecified urinary incontinence: Secondary | ICD-10-CM | POA: Diagnosis present

## 2014-06-08 DIAGNOSIS — S32011S Stable burst fracture of first lumbar vertebra, sequela: Secondary | ICD-10-CM | POA: Diagnosis not present

## 2014-06-08 DIAGNOSIS — R1031 Right lower quadrant pain: Secondary | ICD-10-CM | POA: Diagnosis not present

## 2014-06-08 DIAGNOSIS — S32010A Wedge compression fracture of first lumbar vertebra, initial encounter for closed fracture: Secondary | ICD-10-CM | POA: Diagnosis not present

## 2014-06-08 DIAGNOSIS — F0390 Unspecified dementia without behavioral disturbance: Secondary | ICD-10-CM | POA: Diagnosis present

## 2014-06-08 DIAGNOSIS — Z87891 Personal history of nicotine dependence: Secondary | ICD-10-CM | POA: Diagnosis not present

## 2014-06-08 DIAGNOSIS — F329 Major depressive disorder, single episode, unspecified: Secondary | ICD-10-CM | POA: Diagnosis present

## 2014-06-08 DIAGNOSIS — E118 Type 2 diabetes mellitus with unspecified complications: Secondary | ICD-10-CM | POA: Diagnosis present

## 2014-06-08 DIAGNOSIS — S82899D Other fracture of unspecified lower leg, subsequent encounter for closed fracture with routine healing: Secondary | ICD-10-CM | POA: Diagnosis not present

## 2014-06-08 DIAGNOSIS — N390 Urinary tract infection, site not specified: Secondary | ICD-10-CM | POA: Diagnosis not present

## 2014-06-08 DIAGNOSIS — K5900 Constipation, unspecified: Secondary | ICD-10-CM | POA: Diagnosis present

## 2014-06-08 DIAGNOSIS — R278 Other lack of coordination: Secondary | ICD-10-CM | POA: Diagnosis not present

## 2014-06-08 DIAGNOSIS — S32019A Unspecified fracture of first lumbar vertebra, initial encounter for closed fracture: Secondary | ICD-10-CM | POA: Diagnosis present

## 2014-06-08 DIAGNOSIS — R4182 Altered mental status, unspecified: Secondary | ICD-10-CM | POA: Diagnosis not present

## 2014-06-08 DIAGNOSIS — R1900 Intra-abdominal and pelvic swelling, mass and lump, unspecified site: Secondary | ICD-10-CM | POA: Diagnosis not present

## 2014-06-08 DIAGNOSIS — K567 Ileus, unspecified: Secondary | ICD-10-CM | POA: Diagnosis not present

## 2014-06-08 DIAGNOSIS — S32008G Other fracture of unspecified lumbar vertebra, subsequent encounter for fracture with delayed healing: Secondary | ICD-10-CM | POA: Diagnosis not present

## 2014-06-08 DIAGNOSIS — I10 Essential (primary) hypertension: Secondary | ICD-10-CM | POA: Diagnosis present

## 2014-06-08 DIAGNOSIS — R339 Retention of urine, unspecified: Secondary | ICD-10-CM | POA: Diagnosis not present

## 2014-06-08 DIAGNOSIS — I517 Cardiomegaly: Secondary | ICD-10-CM | POA: Diagnosis not present

## 2014-06-08 DIAGNOSIS — R749 Abnormal serum enzyme level, unspecified: Secondary | ICD-10-CM | POA: Diagnosis not present

## 2014-06-08 DIAGNOSIS — R109 Unspecified abdominal pain: Secondary | ICD-10-CM | POA: Diagnosis not present

## 2014-06-08 DIAGNOSIS — R0989 Other specified symptoms and signs involving the circulatory and respiratory systems: Secondary | ICD-10-CM | POA: Diagnosis not present

## 2014-06-08 DIAGNOSIS — R1084 Generalized abdominal pain: Secondary | ICD-10-CM | POA: Diagnosis not present

## 2014-06-08 DIAGNOSIS — R262 Difficulty in walking, not elsewhere classified: Secondary | ICD-10-CM | POA: Diagnosis not present

## 2014-06-08 DIAGNOSIS — E785 Hyperlipidemia, unspecified: Secondary | ICD-10-CM | POA: Diagnosis present

## 2014-06-08 DIAGNOSIS — G834 Cauda equina syndrome: Secondary | ICD-10-CM | POA: Diagnosis present

## 2014-06-08 DIAGNOSIS — F419 Anxiety disorder, unspecified: Secondary | ICD-10-CM | POA: Diagnosis present

## 2014-06-08 DIAGNOSIS — J449 Chronic obstructive pulmonary disease, unspecified: Secondary | ICD-10-CM | POA: Diagnosis present

## 2014-06-08 DIAGNOSIS — S92902D Unspecified fracture of left foot, subsequent encounter for fracture with routine healing: Secondary | ICD-10-CM | POA: Diagnosis not present

## 2014-06-08 HISTORY — DX: Other fracture of left lower leg, initial encounter for closed fracture: S82.892A

## 2014-06-08 HISTORY — DX: Unspecified fracture of left foot, initial encounter for closed fracture: S92.902A

## 2014-06-08 MED ORDER — TRAMADOL HCL 50 MG PO TABS: 100.0000 mg | ORAL_TABLET | Freq: Four times a day (QID) | ORAL | Status: DC

## 2014-06-08 MED ORDER — HYDROCODONE-ACETAMINOPHEN 5-325 MG PO TABS: 1.0000 | ORAL_TABLET | ORAL | Status: DC | PRN

## 2014-06-08 MED ORDER — ALPRAZOLAM 1 MG PO TABS: 1.0000 mg | ORAL_TABLET | Freq: Three times a day (TID) | ORAL | Status: DC | PRN

## 2014-06-08 NOTE — Discharge Summary (Signed)
Stable for transfer to SNF.  This patient has been seen and I agree with the findings and treatment plan.  Marta LamasJames O. Gae BonWyatt, III, MD, FACS [phone] (pager) [phone] (direct pager) Trauma Surgeon
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
## 2014-06-08 NOTE — Progress Notes (Signed)
Patient ID: Nichole [name], female   DOB: 07/30/1946, 68 y.o.   MRN: [id number] Nichole MendsHeaded to SNF today. I will need to see her in follow up in a few weeks.
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
## 2014-06-08 NOTE — Progress Notes (Signed)
Patient's daughter called and reported that the patient has reactions to Oxycodone IR, she takes Norco at home 5-325.  I called Dr. Janee Mornhompson and reported this.  Norco 5-325, 1 tab, was order for the patient.

## 2014-06-08 NOTE — Clinical Social Work Note (Signed)
[facility]. Pt's daughter, [name], updated at bedside.  [facility]: [phone] Transportation: [address]  Marcelline DeistEmily Reeve [place], [place] [phone]) Licensed Clinical Social Worker Orthopedics [phone](5N17-32) and Surgical [phone](6N17-32)
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
## 2014-06-08 NOTE — Progress Notes (Signed)
Report given at 14:55 to [facility] [place] [facility] being [facility] this afternoon via [facility].

## 2014-06-08 NOTE — Progress Notes (Signed)
NWB. LLE, Back precautions

## 2014-06-08 NOTE — Progress Notes (Signed)
Stable for transfer to SNF.  This patient has been seen and I agree with the findings and treatment plan.  Abygayle Deltoro O. Genevive Printup, III, MD, FACS [phone] (pager) [phone] (direct pager) Trauma Surgeon  
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
## 2014-06-08 NOTE — Progress Notes (Signed)
Chaplain initiated visit with pt. Chaplain facilitated storytelling about complex family history. Chaplain offered prayer upon pt request. Chaplain will follow as needed.   06/08/14 1500  Clinical Encounter Type  Visited With Patient  Visit Type Initial  Spiritual Encounters  Spiritual Needs Emotional;Prayer  Stress Factors  Patient Stress Factors Family relationships  Nejla Reasor, Mayer MaskerCourtney F, Chaplain 06/08/2014 3:21 PM

## 2014-06-09 DIAGNOSIS — R4182 Altered mental status, unspecified: Secondary | ICD-10-CM | POA: Diagnosis not present

## 2014-06-09 DIAGNOSIS — R1084 Generalized abdominal pain: Secondary | ICD-10-CM | POA: Diagnosis not present

## 2014-06-09 DIAGNOSIS — R749 Abnormal serum enzyme level, unspecified: Secondary | ICD-10-CM | POA: Diagnosis not present

## 2014-06-09 DIAGNOSIS — R1011 Right upper quadrant pain: Secondary | ICD-10-CM | POA: Diagnosis not present

## 2014-06-09 DIAGNOSIS — K5660 Unspecified intestinal obstruction: Secondary | ICD-10-CM | POA: Diagnosis not present

## 2014-06-09 DIAGNOSIS — S8252XA Displaced fracture of medial malleolus of left tibia, initial encounter for closed fracture: Secondary | ICD-10-CM | POA: Diagnosis not present

## 2014-06-09 DIAGNOSIS — R339 Retention of urine, unspecified: Secondary | ICD-10-CM | POA: Diagnosis not present

## 2014-06-09 DIAGNOSIS — N390 Urinary tract infection, site not specified: Secondary | ICD-10-CM | POA: Diagnosis not present

## 2014-06-10 DIAGNOSIS — R1084 Generalized abdominal pain: Secondary | ICD-10-CM | POA: Diagnosis not present

## 2014-06-10 DIAGNOSIS — S8252XA Displaced fracture of medial malleolus of left tibia, initial encounter for closed fracture: Secondary | ICD-10-CM | POA: Diagnosis not present

## 2014-06-10 DIAGNOSIS — K5660 Unspecified intestinal obstruction: Secondary | ICD-10-CM | POA: Diagnosis not present

## 2014-06-10 DIAGNOSIS — N390 Urinary tract infection, site not specified: Secondary | ICD-10-CM | POA: Diagnosis not present

## 2014-06-10 DIAGNOSIS — R339 Retention of urine, unspecified: Secondary | ICD-10-CM | POA: Diagnosis not present

## 2014-06-10 DIAGNOSIS — R749 Abnormal serum enzyme level, unspecified: Secondary | ICD-10-CM | POA: Diagnosis not present

## 2014-06-10 DIAGNOSIS — R1011 Right upper quadrant pain: Secondary | ICD-10-CM | POA: Diagnosis not present

## 2014-06-10 DIAGNOSIS — R4182 Altered mental status, unspecified: Secondary | ICD-10-CM | POA: Diagnosis not present

## 2014-06-12 DIAGNOSIS — K5660 Unspecified intestinal obstruction: Secondary | ICD-10-CM | POA: Diagnosis not present

## 2014-06-12 DIAGNOSIS — R749 Abnormal serum enzyme level, unspecified: Secondary | ICD-10-CM | POA: Diagnosis not present

## 2014-06-12 DIAGNOSIS — R4182 Altered mental status, unspecified: Secondary | ICD-10-CM | POA: Diagnosis not present

## 2014-06-12 DIAGNOSIS — N390 Urinary tract infection, site not specified: Secondary | ICD-10-CM | POA: Diagnosis not present

## 2014-06-12 DIAGNOSIS — R1011 Right upper quadrant pain: Secondary | ICD-10-CM | POA: Diagnosis not present

## 2014-06-12 DIAGNOSIS — R339 Retention of urine, unspecified: Secondary | ICD-10-CM | POA: Diagnosis not present

## 2014-06-12 DIAGNOSIS — R1084 Generalized abdominal pain: Secondary | ICD-10-CM | POA: Diagnosis not present

## 2014-06-12 DIAGNOSIS — S8252XA Displaced fracture of medial malleolus of left tibia, initial encounter for closed fracture: Secondary | ICD-10-CM | POA: Diagnosis not present

## 2014-06-13 ENCOUNTER — Encounter (HOSPITAL_COMMUNITY): Payer: Self-pay | Admitting: Emergency Medicine

## 2014-06-13 ENCOUNTER — Emergency Department (HOSPITAL_COMMUNITY): Payer: Medicare Other

## 2014-06-13 ENCOUNTER — Inpatient Hospital Stay (HOSPITAL_COMMUNITY)
Admission: EM | Admit: 2014-06-13 | Discharge: 2014-06-27 | DRG: 460 | Disposition: A | Discharge status: Skilled Nursing Facility | Payer: Medicare Other | Attending: Neurosurgery | Admitting: Neurosurgery

## 2014-06-13 DIAGNOSIS — F0390 Unspecified dementia without behavioral disturbance: Secondary | ICD-10-CM | POA: Diagnosis present

## 2014-06-13 DIAGNOSIS — K5900 Constipation, unspecified: Secondary | ICD-10-CM | POA: Diagnosis present

## 2014-06-13 DIAGNOSIS — R1031 Right lower quadrant pain: Secondary | ICD-10-CM

## 2014-06-13 DIAGNOSIS — J449 Chronic obstructive pulmonary disease, unspecified: Secondary | ICD-10-CM | POA: Diagnosis present

## 2014-06-13 DIAGNOSIS — J90 Pleural effusion, not elsewhere classified: Secondary | ICD-10-CM | POA: Diagnosis not present

## 2014-06-13 DIAGNOSIS — R109 Unspecified abdominal pain: Secondary | ICD-10-CM

## 2014-06-13 DIAGNOSIS — Z87891 Personal history of nicotine dependence: Secondary | ICD-10-CM | POA: Diagnosis not present

## 2014-06-13 DIAGNOSIS — S32018A Other fracture of first lumbar vertebra, initial encounter for closed fracture: Secondary | ICD-10-CM | POA: Diagnosis not present

## 2014-06-13 DIAGNOSIS — S32011S Stable burst fracture of first lumbar vertebra, sequela: Principal | ICD-10-CM

## 2014-06-13 DIAGNOSIS — F329 Major depressive disorder, single episode, unspecified: Secondary | ICD-10-CM | POA: Diagnosis present

## 2014-06-13 DIAGNOSIS — R339 Retention of urine, unspecified: Secondary | ICD-10-CM | POA: Diagnosis not present

## 2014-06-13 DIAGNOSIS — S343XXS Injury of cauda equina, sequela: Secondary | ICD-10-CM | POA: Diagnosis not present

## 2014-06-13 DIAGNOSIS — F419 Anxiety disorder, unspecified: Secondary | ICD-10-CM | POA: Diagnosis present

## 2014-06-13 DIAGNOSIS — S32008G Other fracture of unspecified lumbar vertebra, subsequent encounter for fracture with delayed healing: Secondary | ICD-10-CM | POA: Diagnosis not present

## 2014-06-13 DIAGNOSIS — S32002A Unstable burst fracture of unspecified lumbar vertebra, initial encounter for closed fracture: Secondary | ICD-10-CM | POA: Diagnosis not present

## 2014-06-13 DIAGNOSIS — E118 Type 2 diabetes mellitus with unspecified complications: Secondary | ICD-10-CM | POA: Diagnosis present

## 2014-06-13 DIAGNOSIS — J45909 Unspecified asthma, uncomplicated: Secondary | ICD-10-CM | POA: Diagnosis not present

## 2014-06-13 DIAGNOSIS — S92902A Unspecified fracture of left foot, initial encounter for closed fracture: Secondary | ICD-10-CM | POA: Diagnosis not present

## 2014-06-13 DIAGNOSIS — I10 Essential (primary) hypertension: Secondary | ICD-10-CM | POA: Diagnosis present

## 2014-06-13 DIAGNOSIS — M4806 Spinal stenosis, lumbar region: Secondary | ICD-10-CM | POA: Diagnosis not present

## 2014-06-13 DIAGNOSIS — E46 Unspecified protein-calorie malnutrition: Secondary | ICD-10-CM | POA: Diagnosis not present

## 2014-06-13 DIAGNOSIS — J9811 Atelectasis: Secondary | ICD-10-CM | POA: Diagnosis not present

## 2014-06-13 DIAGNOSIS — R1011 Right upper quadrant pain: Secondary | ICD-10-CM | POA: Diagnosis not present

## 2014-06-13 DIAGNOSIS — R749 Abnormal serum enzyme level, unspecified: Secondary | ICD-10-CM | POA: Diagnosis not present

## 2014-06-13 DIAGNOSIS — K429 Umbilical hernia without obstruction or gangrene: Secondary | ICD-10-CM | POA: Diagnosis not present

## 2014-06-13 DIAGNOSIS — R1084 Generalized abdominal pain: Secondary | ICD-10-CM | POA: Diagnosis not present

## 2014-06-13 DIAGNOSIS — E876 Hypokalemia: Secondary | ICD-10-CM | POA: Diagnosis not present

## 2014-06-13 DIAGNOSIS — E785 Hyperlipidemia, unspecified: Secondary | ICD-10-CM | POA: Diagnosis present

## 2014-06-13 DIAGNOSIS — R4182 Altered mental status, unspecified: Secondary | ICD-10-CM | POA: Diagnosis not present

## 2014-06-13 DIAGNOSIS — J939 Pneumothorax, unspecified: Secondary | ICD-10-CM

## 2014-06-13 DIAGNOSIS — S32010A Wedge compression fracture of first lumbar vertebra, initial encounter for closed fracture: Secondary | ICD-10-CM | POA: Diagnosis not present

## 2014-06-13 DIAGNOSIS — K5660 Unspecified intestinal obstruction: Secondary | ICD-10-CM | POA: Diagnosis not present

## 2014-06-13 DIAGNOSIS — S32009A Unspecified fracture of unspecified lumbar vertebra, initial encounter for closed fracture: Secondary | ICD-10-CM | POA: Diagnosis not present

## 2014-06-13 DIAGNOSIS — R32 Unspecified urinary incontinence: Secondary | ICD-10-CM | POA: Diagnosis present

## 2014-06-13 DIAGNOSIS — S32001A Stable burst fracture of unspecified lumbar vertebra, initial encounter for closed fracture: Secondary | ICD-10-CM | POA: Diagnosis present

## 2014-06-13 DIAGNOSIS — S32009G Unspecified fracture of unspecified lumbar vertebra, subsequent encounter for fracture with delayed healing: Secondary | ICD-10-CM

## 2014-06-13 DIAGNOSIS — M4325 Fusion of spine, thoracolumbar region: Secondary | ICD-10-CM | POA: Diagnosis not present

## 2014-06-13 DIAGNOSIS — S3219XA Other fracture of sacrum, initial encounter for closed fracture: Secondary | ICD-10-CM | POA: Diagnosis not present

## 2014-06-13 DIAGNOSIS — N390 Urinary tract infection, site not specified: Secondary | ICD-10-CM | POA: Diagnosis not present

## 2014-06-13 DIAGNOSIS — I517 Cardiomegaly: Secondary | ICD-10-CM | POA: Diagnosis not present

## 2014-06-13 DIAGNOSIS — G834 Cauda equina syndrome: Secondary | ICD-10-CM | POA: Diagnosis not present

## 2014-06-13 DIAGNOSIS — D649 Anemia, unspecified: Secondary | ICD-10-CM | POA: Diagnosis not present

## 2014-06-13 DIAGNOSIS — S8252XA Displaced fracture of medial malleolus of left tibia, initial encounter for closed fracture: Secondary | ICD-10-CM | POA: Diagnosis not present
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
## 2014-06-13 NOTE — ED Notes (Signed)
Patient via [facility] from [facility]. Per patient she has had abdominal pain X1 week. Patient states last bowel movement was 5 days ago. Per [facility] NP at [facility] thinks patient may have gallstones. Patient sent here for evaluation.

## 2014-06-14 ENCOUNTER — Emergency Department (HOSPITAL_COMMUNITY): Payer: Medicare Other

## 2014-06-14 ENCOUNTER — Encounter (HOSPITAL_COMMUNITY): Payer: Self-pay | Admitting: General Practice

## 2014-06-14 DIAGNOSIS — R32 Unspecified urinary incontinence: Secondary | ICD-10-CM | POA: Diagnosis present

## 2014-06-14 DIAGNOSIS — S92902A Unspecified fracture of left foot, initial encounter for closed fracture: Secondary | ICD-10-CM | POA: Diagnosis not present

## 2014-06-14 DIAGNOSIS — S32001A Stable burst fracture of unspecified lumbar vertebra, initial encounter for closed fracture: Secondary | ICD-10-CM | POA: Diagnosis present

## 2014-06-14 DIAGNOSIS — S343XXS Injury of cauda equina, sequela: Secondary | ICD-10-CM | POA: Diagnosis not present

## 2014-06-14 DIAGNOSIS — I10 Essential (primary) hypertension: Secondary | ICD-10-CM | POA: Diagnosis not present

## 2014-06-14 DIAGNOSIS — N390 Urinary tract infection, site not specified: Secondary | ICD-10-CM | POA: Diagnosis not present

## 2014-06-14 DIAGNOSIS — S32010A Wedge compression fracture of first lumbar vertebra, initial encounter for closed fracture: Secondary | ICD-10-CM | POA: Diagnosis not present

## 2014-06-14 DIAGNOSIS — D649 Anemia, unspecified: Secondary | ICD-10-CM | POA: Diagnosis not present

## 2014-06-14 DIAGNOSIS — K5900 Constipation, unspecified: Secondary | ICD-10-CM | POA: Diagnosis not present

## 2014-06-14 DIAGNOSIS — F0390 Unspecified dementia without behavioral disturbance: Secondary | ICD-10-CM | POA: Diagnosis not present

## 2014-06-14 DIAGNOSIS — E46 Unspecified protein-calorie malnutrition: Secondary | ICD-10-CM | POA: Diagnosis not present

## 2014-06-14 DIAGNOSIS — E876 Hypokalemia: Secondary | ICD-10-CM | POA: Diagnosis not present

## 2014-06-14 DIAGNOSIS — J449 Chronic obstructive pulmonary disease, unspecified: Secondary | ICD-10-CM | POA: Diagnosis not present

## 2014-06-14 DIAGNOSIS — J90 Pleural effusion, not elsewhere classified: Secondary | ICD-10-CM | POA: Diagnosis not present

## 2014-06-14 DIAGNOSIS — K429 Umbilical hernia without obstruction or gangrene: Secondary | ICD-10-CM | POA: Diagnosis not present

## 2014-06-14 DIAGNOSIS — R1031 Right lower quadrant pain: Secondary | ICD-10-CM | POA: Diagnosis present

## 2014-06-14 DIAGNOSIS — M4806 Spinal stenosis, lumbar region: Secondary | ICD-10-CM | POA: Diagnosis not present

## 2014-06-14 DIAGNOSIS — S32018A Other fracture of first lumbar vertebra, initial encounter for closed fracture: Secondary | ICD-10-CM | POA: Diagnosis not present

## 2014-06-14 DIAGNOSIS — Z87891 Personal history of nicotine dependence: Secondary | ICD-10-CM | POA: Diagnosis not present

## 2014-06-14 DIAGNOSIS — S3219XA Other fracture of sacrum, initial encounter for closed fracture: Secondary | ICD-10-CM | POA: Diagnosis not present

## 2014-06-14 DIAGNOSIS — S8252XA Displaced fracture of medial malleolus of left tibia, initial encounter for closed fracture: Secondary | ICD-10-CM | POA: Diagnosis not present

## 2014-06-14 DIAGNOSIS — F329 Major depressive disorder, single episode, unspecified: Secondary | ICD-10-CM | POA: Diagnosis present

## 2014-06-14 DIAGNOSIS — S32002A Unstable burst fracture of unspecified lumbar vertebra, initial encounter for closed fracture: Secondary | ICD-10-CM | POA: Diagnosis not present

## 2014-06-14 DIAGNOSIS — I517 Cardiomegaly: Secondary | ICD-10-CM | POA: Diagnosis not present

## 2014-06-14 DIAGNOSIS — S32011S Stable burst fracture of first lumbar vertebra, sequela: Secondary | ICD-10-CM | POA: Diagnosis not present

## 2014-06-14 DIAGNOSIS — F419 Anxiety disorder, unspecified: Secondary | ICD-10-CM | POA: Diagnosis present

## 2014-06-14 DIAGNOSIS — E785 Hyperlipidemia, unspecified: Secondary | ICD-10-CM | POA: Diagnosis present

## 2014-06-14 DIAGNOSIS — G834 Cauda equina syndrome: Secondary | ICD-10-CM | POA: Diagnosis not present

## 2014-06-14 DIAGNOSIS — J45909 Unspecified asthma, uncomplicated: Secondary | ICD-10-CM | POA: Diagnosis not present

## 2014-06-14 DIAGNOSIS — J9811 Atelectasis: Secondary | ICD-10-CM | POA: Diagnosis not present

## 2014-06-14 DIAGNOSIS — E118 Type 2 diabetes mellitus with unspecified complications: Secondary | ICD-10-CM | POA: Diagnosis not present

## 2014-06-14 DIAGNOSIS — S32009A Unspecified fracture of unspecified lumbar vertebra, initial encounter for closed fracture: Secondary | ICD-10-CM | POA: Diagnosis not present

## 2014-06-14 DIAGNOSIS — M4325 Fusion of spine, thoracolumbar region: Secondary | ICD-10-CM | POA: Diagnosis not present

## 2014-06-14 HISTORY — PX: ORIF TIBIA FRACTURE: SHX5416

## 2014-06-14 HISTORY — DX: Stable burst fracture of unspecified lumbar vertebra, initial encounter for closed fracture: S32.001A

## 2014-06-14 LAB — COMPREHENSIVE METABOLIC PANEL
ALT: 35 U/L (ref 0–35)
AST: 37 U/L (ref 0–37)
Albumin: 3.1 g/dL — ABNORMAL LOW (ref 3.5–5.2)
Alkaline Phosphatase: 203 U/L — ABNORMAL HIGH (ref 39–117)
Anion gap: 14 (ref 5–15)
BUN: 10 mg/dL (ref 6–23)
CO2: 27 mEq/L (ref 19–32)
Calcium: 9.3 mg/dL (ref 8.4–10.5)
Chloride: 95 mEq/L — ABNORMAL LOW (ref 96–112)
Creatinine, Ser: 0.42 mg/dL — ABNORMAL LOW (ref 0.50–1.10)
GFR calc Af Amer: >90 mL/min (ref >90)
GFR calc non Af Amer: >90 mL/min (ref >90)
Glucose, Bld: 105 mg/dL — ABNORMAL HIGH (ref 70–99)
Potassium: 3.1 mEq/L — ABNORMAL LOW (ref 3.7–5.3)
Sodium: 136 mEq/L — ABNORMAL LOW (ref 137–147)
Total Bilirubin: 0.6 mg/dL (ref 0.3–1.2)
Total Protein: 6.9 g/dL (ref 6.0–8.3)

## 2014-06-14 LAB — CBC WITH DIFFERENTIAL/PLATELET
Basophils Absolute: 0 10*3/uL (ref 0.0–0.1)
Basophils Relative: 0 % (ref 0–1)
Eosinophils Absolute: 0.2 10*3/uL (ref 0.0–0.7)
Eosinophils Relative: 2 % (ref 0–5)
HCT: 37.9 % (ref 36.0–46.0)
Hemoglobin: 12.6 g/dL (ref 12.0–15.0)
Lymphocytes Relative: 15 % (ref 12–46)
Lymphs Abs: 1.7 10*3/uL (ref 0.7–4.0)
MCH: 28.3 pg (ref 26.0–34.0)
MCHC: 33.2 g/dL (ref 30.0–36.0)
MCV: 85 fL (ref 78.0–100.0)
Monocytes Absolute: 1 10*3/uL (ref 0.1–1.0)
Monocytes Relative: 9 % (ref 3–12)
Neutro Abs: 8.2 10*3/uL — ABNORMAL HIGH (ref 1.7–7.7)
Neutrophils Relative %: 74 % (ref 43–77)
Platelets: 636 10*3/uL — ABNORMAL HIGH (ref 150–400)
RBC: 4.46 MIL/uL (ref 3.87–5.11)
RDW: 13.3 % (ref 11.5–15.5)
WBC: 11.2 10*3/uL — ABNORMAL HIGH (ref 4.0–10.5)

## 2014-06-14 LAB — URINE MICROSCOPIC-ADD ON

## 2014-06-14 LAB — URINALYSIS, ROUTINE W REFLEX MICROSCOPIC
Glucose, UA: NEGATIVE mg/dL
Ketones, ur: NEGATIVE mg/dL
Nitrite: NEGATIVE
Protein, ur: NEGATIVE mg/dL
Specific Gravity, Urine: 1.025 (ref 1.005–1.030)
Urobilinogen, UA: 4 mg/dL — ABNORMAL HIGH (ref 0.0–1.0)
pH: 6 (ref 5.0–8.0)

## 2014-06-14 MED ORDER — DILTIAZEM HCL ER COATED BEADS 240 MG PO CP24
240.0000 mg | ORAL_CAPSULE | Freq: Every day | ORAL | Status: DC
  Administered 2014-06-15 – 2014-06-27 (×12): 240 mg via ORAL
  Filled 2014-06-14 (×12): qty 1

## 2014-06-14 MED ORDER — MORPHINE SULFATE 2 MG/ML IJ SOLN
2.0000 mg | Freq: Once | INTRAMUSCULAR | Status: AC
  Administered 2014-06-14: 2 mg via INTRAMUSCULAR
  Filled 2014-06-14: qty 1

## 2014-06-14 MED ORDER — ALPRAZOLAM 0.5 MG PO TABS
1.0000 mg | ORAL_TABLET | Freq: Three times a day (TID) | ORAL | Status: DC | PRN
  Administered 2014-06-15: 1 mg via ORAL
  Filled 2014-06-14: qty 2

## 2014-06-14 MED ORDER — HYDROCHLOROTHIAZIDE 25 MG PO TABS
25.0000 mg | ORAL_TABLET | Freq: Every day | ORAL | Status: DC
  Administered 2014-06-15 – 2014-06-27 (×12): 25 mg via ORAL
  Filled 2014-06-14 (×12): qty 1

## 2014-06-14 MED ORDER — DOXAZOSIN MESYLATE 2 MG PO TABS
2.0000 mg | ORAL_TABLET | Freq: Every day | ORAL | Status: DC
  Administered 2014-06-15 – 2014-06-27 (×12): 2 mg via ORAL
  Filled 2014-06-14 (×16): qty 1

## 2014-06-14 MED ORDER — OXYCODONE HCL 5 MG PO TABS
5.0000 mg | ORAL_TABLET | ORAL | Status: DC | PRN
  Administered 2014-06-14 – 2014-06-23 (×20): 5 mg via ORAL
  Filled 2014-06-14 (×21): qty 1

## 2014-06-14 MED ORDER — ONDANSETRON HCL 4 MG/2ML IJ SOLN
4.0000 mg | Freq: Once | INTRAMUSCULAR | Status: AC
  Administered 2014-06-14: 4 mg via INTRAVENOUS
  Filled 2014-06-14: qty 2

## 2014-06-14 MED ORDER — MORPHINE SULFATE 2 MG/ML IJ SOLN
2.0000 mg | Freq: Once | INTRAMUSCULAR | Status: AC
  Administered 2014-06-14: 2 mg via INTRAVENOUS
  Filled 2014-06-14: qty 1

## 2014-06-14 MED ORDER — ONDANSETRON HCL 4 MG/2ML IJ SOLN
4.0000 mg | Freq: Once | INTRAMUSCULAR | Status: AC
  Administered 2014-06-14: 4 mg via INTRAVENOUS

## 2014-06-14 MED ORDER — SENNOSIDES-DOCUSATE SODIUM 8.6-50 MG PO TABS
1.0000 | ORAL_TABLET | Freq: Two times a day (BID) | ORAL | Status: DC
  Administered 2014-06-14 – 2014-06-21 (×15): 1 via ORAL
  Filled 2014-06-14 (×17): qty 1

## 2014-06-14 MED ORDER — BISACODYL 5 MG PO TBEC
10.0000 mg | DELAYED_RELEASE_TABLET | Freq: Every day | ORAL | Status: DC | PRN
Start: 2014-06-14 — End: 2014-06-27
  Administered 2014-06-14: 10 mg via ORAL
  Filled 2014-06-14 (×2): qty 2

## 2014-06-14 MED ORDER — ALBUTEROL SULFATE HFA 108 (90 BASE) MCG/ACT IN AERS: 2.0000 | INHALATION_SPRAY | Freq: Four times a day (QID) | RESPIRATORY_TRACT | Status: DC | PRN

## 2014-06-14 MED ORDER — ONDANSETRON HCL 4 MG/2ML IJ SOLN
4.0000 mg | Freq: Once | INTRAMUSCULAR | Status: DC
  Filled 2014-06-14: qty 2

## 2014-06-14 MED ORDER — FLUOXETINE HCL 20 MG PO CAPS
40.0000 mg | ORAL_CAPSULE | Freq: Every morning | ORAL | Status: DC
  Administered 2014-06-15 – 2014-06-27 (×12): 40 mg via ORAL
  Filled 2014-06-14 (×12): qty 2

## 2014-06-14 NOTE — ED Notes (Signed)
Pt raised slightly in bed for sips of water. NAD. Daughter called and stated that she is coming to visit her pt.

## 2014-06-14 NOTE — Progress Notes (Signed)
[facility]. Awaiting transport of patient.  Lance BoschAnna Joan Avetisyan RN

## 2014-06-14 NOTE — Progress Notes (Signed)
[facility].  Patient arrived with foley.  THis RN is leaving foley in due to severe lumbar stenosis and bed rest.  Paged Dr. Gerlene FeeKritzer and am awaiting orders.  Mariam DollarAnna Lamario Mani,RN

## 2014-06-14 NOTE — ED Notes (Signed)
Phoned bed placement regarding bed. States there are currently no beds on the neuro floor but they should be getting some later today.
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
## 2014-06-14 NOTE — ED Notes (Signed)
Report attempted.  Nurse to call back.
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
## 2014-06-15 NOTE — Progress Notes (Signed)
Patient ID: Nichole BankerVivian L Moll, female   DOB: 03/12/1946, 68 y.o.   MRN: [id number] Afeb, vss No new neuro issues She says she had a good night. Will see if PT can have any success with mobilization. If not, will consider surgery early next week.

## 2014-06-15 NOTE — Clinical Social Work Psychosocial (Signed)
Clinical Social Work Department BRIEF PSYCHOSOCIAL ASSESSMENT 06/15/2014  Patient:  Nichole [name], Nichole [name]     Account Number:  [id number]     Admit date:  06/13/2014  Clinical Social Worker:  Marciano Sequin  Date/Time:  06/15/2014 02:38 PM  Referred by:  RN  Date Referred:  06/15/2014 Referred for  SNF Placement   Other Referral:   Interview type:  Family Other interview type:   Pt is oriented to self; Husband Nichole [name] [phone], [phone],  [phone]    PSYCHOSOCIAL DATA Living Status:  FACILITY Admitted from facility:  [facility] Level of care:  [place] Primary support name:  Nichole [name] Primary support relationship to patient:  SPOUSE Degree of support available:   Strong Support System    CURRENT CONCERNS: None Noted   Other Concerns:    SOCIAL WORK ASSESSMENT / PLAN CSW met the pt and pt's husband Nichole [name] at bedside. CSW introduced self and purpose of the visit. CSW discussed the clinical team's recommendations for rehab.  Nichole [name] expressed that the family may look for another SNF to place the pt in for rehab. CSW provided Nichole [name] with a SNF list. Nichole [name] reported his would review the SNF list with his step daughter. CSW and Nichole [name] discussed the [employer] as it relates to SNF placement. Nichole [name] reported that his wife now has [facility]. CSW explained the SNF process to [place]. CSW answered all questions in which Nichole [name] inquired about. CSW provided [place] with contact information for further questions. CSW will continue to follow this pt and assist with discharge as needed.   Assessment/plan status:  Psychosocial Support/Ongoing Assessment of Needs Other assessment/ plan:   Information/referral to community resources:    PATIENT'S/FAMILY'S RESPONSE TO PLAN OF CARE: Pt's husband Nichole [name] presented with a calm mood and affect. Nichole [name] was receptive to the clinical team's recommendations. Nichole [name] reported wanting the best for his wife, so he is open to SNF  rehab then her returning back home with him.   [place], MSW, [place]

## 2014-06-16 NOTE — Progress Notes (Signed)
Patient ID: Nichole BankerVivian L Viney, female   DOB: 12/24/1945, 68 y.o.   MRN: [id number] Patient still with low back pain but she feels like it slowly improving  Moves lower extremities  well with 4+ out of 5 diffuse and equal strength.  Continue rehabilitation in the brace possible surgery if fail conservative treatment

## 2014-06-17 MED ORDER — BISACODYL 10 MG RE SUPP
RECTAL | Status: AC
  Administered 2014-06-17: 10 mg
  Filled 2014-06-17: qty 1

## 2014-06-17 MED ORDER — BISACODYL 10 MG RE SUPP: 10.0000 mg | Freq: Every day | RECTAL | Status: DC | PRN

## 2014-06-17 NOTE — Plan of Care (Signed)
Problem: Phase I Progression Outcomes Goal: Initial discharge plan identified Outcome: Completed/Met Date Met:  06/17/14

## 2014-06-17 NOTE — Progress Notes (Signed)
Patient ID: Nichole [name], female   DOB: 08/23/1945, 68 y.o.   MRN: [id number] Patient still with a lot of back pain is having some digit pain and cramping in her legs she attributes it to activity  Strength appears to be 5 out of 5 although she does favor some proximal pain related weakness in her iliopsoas in the right leg.  Indeed immobilize in the brace patient may need surgical intervention this week.

## 2014-06-17 NOTE — Plan of Care (Signed)
Problem: Phase I Progression Outcomes Goal: Voiding-avoid urinary catheter unless indicated Outcome: Progressing     

## 2014-06-18 NOTE — Progress Notes (Signed)
UR COMPLETED  

## 2014-06-18 NOTE — Progress Notes (Signed)
Patient ID: Nichole [name], female   DOB: [birth year], 68 y.o.   MRN: [id number] Afeb, vss No new neuro issues Her strength appears to be intact. She is ready to start working with therapy aggressively to see if she can mobilize and perhaps she can avoid surgery. Hopefully therapy will start aggressively today as she was not seen over the weekend.
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
## 2014-06-18 NOTE — Clinical Social Work Note (Signed)
CSW reviewed chart. Once pt is medically stable the pt can transition back to [facility]. CSW will continue to provided support and assist with discharge needs.   Haislee Corso, MSW, LCSWA [phone]

## 2014-06-19 MED ORDER — POTASSIUM CHLORIDE CRYS ER 20 MEQ PO TBCR: 40.0000 meq | EXTENDED_RELEASE_TABLET | Freq: Every day | ORAL | Status: DC

## 2014-06-19 NOTE — Progress Notes (Signed)
Patient ID: Nichole [name], female   DOB: 25-Jul-1946, 68 y.o.   MRN: [id number] Afeb, vss Slow progress with therapy. Limited secondary to pain. Will review films and determine what surgery might be best and then order necessary instruments in anticipation of proceeding when everything is in order.
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
## 2014-06-20 ENCOUNTER — Other Ambulatory Visit: Payer: Self-pay | Admitting: Neurosurgery

## 2014-06-20 NOTE — Progress Notes (Signed)
I discussed situation with Nichole [name]. She has a very significant L1 burst fracture with severe spinal stenosis and resultant radicular pain. I discussed the possibility of moving forward with surgery on Friday for decompression infusion by means of a left-sided retroperitoneal L1 corpectomy with strut graft fusion from T12-L2 augmented with a lateral plate instrumentation and posterior percutaneous pedicle screw fixation. Overall the patient appears to be of acceptable surgical risk. She has no current contraindications to surgery. I discussed situation with her in detail. She is willing to proceed. I will discuss things further with her and her family tomorrow.

## 2014-06-20 NOTE — Progress Notes (Signed)
Patient ID: Nichole [name], female   DOB: [birth year], 68 y.o.   MRN: [id number] Afeb, vss No new neuro issues Best approach might be from the side May have Dr Jordan LikesPool handle this if he is willing since he has more experience with that approach in lower t spine region where lung is involved. Will discuss further with him.

## 2014-06-21 LAB — CBC WITH DIFFERENTIAL/PLATELET
Basophils Absolute: 0 10*3/uL (ref 0.0–0.1)
Basophils Relative: 0 % (ref 0–1)
Eosinophils Absolute: 0.2 10*3/uL (ref 0.0–0.7)
Eosinophils Relative: 2 % (ref 0–5)
HCT: 38 % (ref 36.0–46.0)
Hemoglobin: 12.3 g/dL (ref 12.0–15.0)
Lymphocytes Relative: 24 % (ref 12–46)
Lymphs Abs: 2.1 10*3/uL (ref 0.7–4.0)
MCH: 28.1 pg (ref 26.0–34.0)
MCHC: 32.4 g/dL (ref 30.0–36.0)
MCV: 87 fL (ref 78.0–100.0)
Monocytes Absolute: 1 10*3/uL (ref 0.1–1.0)
Monocytes Relative: 11 % (ref 3–12)
Neutro Abs: 5.7 10*3/uL (ref 1.7–7.7)
Neutrophils Relative %: 63 % (ref 43–77)
Platelets: 494 10*3/uL — ABNORMAL HIGH (ref 150–400)
RBC: 4.37 MIL/uL (ref 3.87–5.11)
RDW: 13.2 % (ref 11.5–15.5)
WBC: 9.1 10*3/uL (ref 4.0–10.5)

## 2014-06-21 MED ORDER — VANCOMYCIN HCL IN DEXTROSE 1-5 GM/200ML-% IV SOLN
1000.0000 mg | INTRAVENOUS | Status: AC
  Administered 2014-06-22: 1000 mg via INTRAVENOUS
  Filled 2014-06-21: qty 200

## 2014-06-21 NOTE — Progress Notes (Signed)
Overall stable. Still with pain into right lower extremities with arising. Remains nonambulatory secondary to pain.  Awake and alert. Oriented and appropriate. Cranial nerve function intact. Motor and sensory function upper extremities normal. Lower extremity motor function reveals decreased strength with hip flexion bilaterally right greater than left grating out of 45. Quadriceps function 4+ over 5 on left 4 over 5 on right. Dorsiflexion on the left not testable secondary to ankle injury. Dorsiflexion on the right intact. Plantar flexor on right intact.  L1 burst fracture with severe canal compromise. Plan left-sided L1 retroperitoneal corpectomy with interbody fusion utilizing expandable cage from T12-L2 with local autograft coupled with lateral plating. We'll be further augmented by posterior percutaneous pedicle screw instrumentation. Risks and benefits of been explained.patient aware of risks including but not limited to the risk of anesthesia, bleeding, infection, CSF leak, nerve root injury, spinal cord injury, fusion failure, instrumentation failure, continued pain, and non-benefit. She also understands the risk of injury to intraperitoneal and intrathoracic structures including possible pneumothorax. Patient wishes to proceed.

## 2014-06-21 NOTE — Clinical Social Work Note (Signed)
CSW reviewed chart. Once pt is medically stable the pt can transition back to [facility]. [facility] [facility] [place] [facility]. CSW will continue to provided support and assist with discharge needs.   Shekita Boyden, MSW, LCSWA [phone]

## 2014-06-21 NOTE — Progress Notes (Signed)
PT Cancellation Note  Patient Details Name: Nichole [name] MRN: [id number] DOB: 04/27/1946   Cancelled Treatment:    Reason Eval/Treat Not Completed: Other (comment). Patient schedule for back surgery in AM. Will hold for new orders after surgery   Ollis Daudelin, Adline PotterJulia Elizabeth 06/21/2014, 1:40 PM

## 2014-06-22 ENCOUNTER — Inpatient Hospital Stay (HOSPITAL_COMMUNITY): Payer: Medicare Other

## 2014-06-22 ENCOUNTER — Inpatient Hospital Stay (HOSPITAL_COMMUNITY): Payer: Medicare Other | Admitting: Anesthesiology

## 2014-06-22 ENCOUNTER — Encounter (HOSPITAL_COMMUNITY)
Admission: EM | Disposition: A | Discharge status: Skilled Nursing Facility | Payer: Self-pay | Source: Home / Self Care | Attending: Neurosurgery

## 2014-06-22 HISTORY — PX: ANTERIOR LAT LUMBAR FUSION: SHX1168

## 2014-06-22 HISTORY — PX: LUMBAR PERCUTANEOUS PEDICLE SCREW 1 LEVEL: SHX5560

## 2014-06-22 LAB — BASIC METABOLIC PANEL
Anion gap: 11 (ref 5–15)
BUN: 10 mg/dL (ref 6–23)
CO2: 26 mEq/L (ref 19–32)
Calcium: 8.9 mg/dL (ref 8.4–10.5)
Chloride: 96 mEq/L (ref 96–112)
Creatinine, Ser: 0.46 mg/dL — ABNORMAL LOW (ref 0.50–1.10)
GFR calc Af Amer: >90 mL/min (ref >90)
GFR calc non Af Amer: >90 mL/min (ref >90)
Glucose, Bld: 192 mg/dL — ABNORMAL HIGH (ref 70–99)
Potassium: 3.5 mEq/L — ABNORMAL LOW (ref 3.7–5.3)
Sodium: 133 mEq/L — ABNORMAL LOW (ref 137–147)

## 2014-06-22 LAB — POCT I-STAT EG7
Acid-Base Excess: 7 mmol/L — ABNORMAL HIGH (ref 0.0–2.0)
Bicarbonate: 31 mEq/L — ABNORMAL HIGH (ref 20.0–24.0)
Calcium, Ion: 1.21 mmol/L (ref 1.13–1.30)
HCT: 29 % — ABNORMAL LOW (ref 36.0–46.0)
Hemoglobin: 9.9 g/dL — ABNORMAL LOW (ref 12.0–15.0)
O2 Saturation: 100 %
Patient temperature: 36.8
Potassium: 4.4 mEq/L (ref 3.7–5.3)
Sodium: 133 mEq/L — ABNORMAL LOW (ref 137–147)
TCO2: 32 mmol/L (ref 0–100)
pCO2, Ven: 41 mmHg — ABNORMAL LOW (ref 45.0–50.0)
pH, Ven: 7.486 — ABNORMAL HIGH (ref 7.250–7.300)
pO2, Ven: 247 mmHg — ABNORMAL HIGH (ref 30.0–45.0)

## 2014-06-22 LAB — MRSA PCR SCREENING: MRSA by PCR: NEGATIVE

## 2014-06-22 LAB — POCT I-STAT 4, (NA,K, GLUC, HGB,HCT)
Glucose, Bld: 97 mg/dL (ref 70–99)
HCT: 38 % (ref 36.0–46.0)
Hemoglobin: 12.9 g/dL (ref 12.0–15.0)
Potassium: 3.7 mEq/L (ref 3.7–5.3)
Sodium: 137 mEq/L (ref 137–147)

## 2014-06-22 LAB — PREPARE RBC (CROSSMATCH)

## 2014-06-22 LAB — SURGICAL PCR SCREEN
MRSA, PCR: NEGATIVE
Staphylococcus aureus: NEGATIVE

## 2014-06-22 LAB — ABO/RH: ABO/RH(D): A NEG

## 2014-06-22 SURGERY — ANTERIOR LATERAL LUMBAR FUSION 1 LEVEL
Anesthesia: General | Site: Flank

## 2014-06-22 MED ORDER — ARTIFICIAL TEARS OP OINT
TOPICAL_OINTMENT | OPHTHALMIC | Status: AC
  Filled 2014-06-22: qty 3.5

## 2014-06-22 MED ORDER — HYDROMORPHONE HCL 1 MG/ML IJ SOLN
INTRAMUSCULAR | Status: AC
  Administered 2014-06-22: 1 mg via INTRAVENOUS
  Filled 2014-06-22: qty 1

## 2014-06-22 MED ORDER — THROMBIN 5000 UNITS EX SOLR
OROMUCOSAL | Status: DC | PRN
  Administered 2014-06-22: 17:00:00 via TOPICAL

## 2014-06-22 MED ORDER — ACETAMINOPHEN 325 MG PO TABS: 650.0000 mg | ORAL_TABLET | ORAL | Status: DC | PRN

## 2014-06-22 MED ORDER — FENTANYL CITRATE 0.05 MG/ML IJ SOLN
INTRAMUSCULAR | Status: AC
  Filled 2014-06-22: qty 5

## 2014-06-22 MED ORDER — FUROSEMIDE 10 MG/ML IJ SOLN
INTRAMUSCULAR | Status: DC | PRN
  Administered 2014-06-22: 10 mg via INTRAMUSCULAR

## 2014-06-22 MED ORDER — SODIUM CHLORIDE 0.9 % IV SOLN
10.0000 mg | INTRAVENOUS | Status: DC | PRN
  Administered 2014-06-22: 5 ug/min via INTRAVENOUS

## 2014-06-22 MED ORDER — MEPERIDINE HCL 25 MG/ML IJ SOLN: 6.2500 mg | INTRAMUSCULAR | Status: DC | PRN

## 2014-06-22 MED ORDER — FLEET ENEMA 7-19 GM/118ML RE ENEM
1.0000 | ENEMA | Freq: Once | RECTAL | Status: AC | PRN
  Filled 2014-06-22: qty 1

## 2014-06-22 MED ORDER — SODIUM CHLORIDE 0.9 % IR SOLN
Status: DC | PRN
  Administered 2014-06-22: 16:00:00

## 2014-06-22 MED ORDER — VANCOMYCIN HCL 1000 MG IV SOLR: 1000.0000 mg | INTRAVENOUS | Status: DC | PRN

## 2014-06-22 MED ORDER — DEXAMETHASONE SODIUM PHOSPHATE 10 MG/ML IJ SOLN
10.0000 mg | INTRAMUSCULAR | Status: AC
  Administered 2014-06-23: 10 mg via INTRAVENOUS
  Filled 2014-06-22: qty 1

## 2014-06-22 MED ORDER — ONDANSETRON HCL 4 MG/2ML IJ SOLN
4.0000 mg | INTRAMUSCULAR | Status: DC | PRN
  Administered 2014-06-23 – 2014-06-24 (×3): 4 mg via INTRAVENOUS
  Filled 2014-06-22 (×3): qty 2

## 2014-06-22 MED ORDER — HYDROCODONE-ACETAMINOPHEN 5-325 MG PO TABS
1.0000 | ORAL_TABLET | ORAL | Status: DC | PRN
  Administered 2014-06-24 – 2014-06-26 (×4): 2 via ORAL
  Filled 2014-06-22 (×5): qty 2

## 2014-06-22 MED ORDER — LACTATED RINGERS IV SOLN
INTRAVENOUS | Status: DC | PRN
  Administered 2014-06-22 (×2): via INTRAVENOUS

## 2014-06-22 MED ORDER — SODIUM CHLORIDE 0.9 % IJ SOLN
3.0000 mL | Freq: Two times a day (BID) | INTRAMUSCULAR | Status: DC
  Administered 2014-06-22 – 2014-06-25 (×5): 3 mL via INTRAVENOUS

## 2014-06-22 MED ORDER — POLYETHYLENE GLYCOL 3350 17 G PO PACK
17.0000 g | PACK | Freq: Every day | ORAL | Status: DC | PRN
  Filled 2014-06-22: qty 1

## 2014-06-22 MED ORDER — FENTANYL CITRATE 0.05 MG/ML IJ SOLN
INTRAMUSCULAR | Status: DC | PRN
  Administered 2014-06-22 (×5): 50 ug via INTRAVENOUS
  Administered 2014-06-22 (×2): 100 ug via INTRAVENOUS
  Administered 2014-06-22 (×6): 50 ug via INTRAVENOUS

## 2014-06-22 MED ORDER — THROMBIN 5000 UNITS EX SOLR
CUTANEOUS | Status: DC | PRN
  Administered 2014-06-22 (×2): 5000 [IU] via TOPICAL

## 2014-06-22 MED ORDER — DIAZEPAM 5 MG PO TABS
5.0000 mg | ORAL_TABLET | Freq: Four times a day (QID) | ORAL | Status: DC | PRN
  Administered 2014-06-26 – 2014-06-27 (×2): 5 mg via ORAL
  Filled 2014-06-22 (×2): qty 1

## 2014-06-22 MED ORDER — OXYCODONE-ACETAMINOPHEN 5-325 MG PO TABS
1.0000 | ORAL_TABLET | ORAL | Status: DC | PRN
  Administered 2014-06-23 – 2014-06-27 (×7): 2 via ORAL
  Filled 2014-06-22 (×7): qty 2

## 2014-06-22 MED ORDER — SODIUM CHLORIDE 0.9 % IV SOLN: Freq: Once | INTRAVENOUS | Status: DC

## 2014-06-22 MED ORDER — ARTIFICIAL TEARS OP OINT
TOPICAL_OINTMENT | OPHTHALMIC | Status: DC | PRN
  Administered 2014-06-22: 1 via OPHTHALMIC

## 2014-06-22 MED ORDER — MENTHOL 3 MG MT LOZG: 1.0000 | LOZENGE | OROMUCOSAL | Status: DC | PRN

## 2014-06-22 MED ORDER — VANCOMYCIN HCL IN DEXTROSE 1-5 GM/200ML-% IV SOLN
1000.0000 mg | Freq: Once | INTRAVENOUS | Status: AC
  Administered 2014-06-23: 1000 mg via INTRAVENOUS
  Filled 2014-06-22: qty 200

## 2014-06-22 MED ORDER — BISACODYL 10 MG RE SUPP: 10.0000 mg | Freq: Every day | RECTAL | Status: DC | PRN

## 2014-06-22 MED ORDER — PHENOL 1.4 % MT LIQD: 1.0000 | OROMUCOSAL | Status: DC | PRN

## 2014-06-22 MED ORDER — PROMETHAZINE HCL 25 MG/ML IJ SOLN: 6.2500 mg | INTRAMUSCULAR | Status: DC | PRN

## 2014-06-22 MED ORDER — FENTANYL CITRATE 0.05 MG/ML IJ SOLN
25.0000 ug | INTRAMUSCULAR | Status: DC | PRN
  Administered 2014-06-22 (×2): 50 ug via INTRAVENOUS

## 2014-06-22 MED ORDER — VANCOMYCIN HCL IN DEXTROSE 1-5 GM/200ML-% IV SOLN
INTRAVENOUS | Status: AC
  Filled 2014-06-22: qty 200

## 2014-06-22 MED ORDER — SUCCINYLCHOLINE CHLORIDE 20 MG/ML IJ SOLN
INTRAMUSCULAR | Status: DC | PRN
  Administered 2014-06-22: 100 mg via INTRAVENOUS

## 2014-06-22 MED ORDER — ONDANSETRON HCL 4 MG/2ML IJ SOLN
INTRAMUSCULAR | Status: DC | PRN
  Administered 2014-06-22: 4 mg via INTRAVENOUS

## 2014-06-22 MED ORDER — HYDROMORPHONE HCL 1 MG/ML IJ SOLN
0.5000 mg | INTRAMUSCULAR | Status: DC | PRN
  Administered 2014-06-22 – 2014-06-23 (×2): 1 mg via INTRAVENOUS
  Administered 2014-06-23: 0.5 mg via INTRAVENOUS
  Administered 2014-06-23 (×2): 1 mg via INTRAVENOUS
  Filled 2014-06-22 (×4): qty 1

## 2014-06-22 MED ORDER — LIDOCAINE HCL (CARDIAC) 20 MG/ML IV SOLN
INTRAVENOUS | Status: AC
  Filled 2014-06-22: qty 10

## 2014-06-22 MED ORDER — FENTANYL CITRATE 0.05 MG/ML IJ SOLN
INTRAMUSCULAR | Status: AC
  Filled 2014-06-22: qty 2

## 2014-06-22 MED ORDER — ACETAMINOPHEN 650 MG RE SUPP: 650.0000 mg | RECTAL | Status: DC | PRN

## 2014-06-22 MED ORDER — LIDOCAINE HCL (CARDIAC) 20 MG/ML IV SOLN
INTRAVENOUS | Status: DC | PRN
  Administered 2014-06-22: 100 mg via INTRAVENOUS

## 2014-06-22 MED ORDER — PROPOFOL 10 MG/ML IV BOLUS
INTRAVENOUS | Status: AC
  Filled 2014-06-22: qty 20

## 2014-06-22 MED ORDER — ALBUMIN HUMAN 5 % IV SOLN
INTRAVENOUS | Status: DC | PRN
  Administered 2014-06-22: 17:00:00 via INTRAVENOUS

## 2014-06-22 MED ORDER — SODIUM CHLORIDE 0.9 % IV SOLN: 250.0000 mL | INTRAVENOUS | Status: DC

## 2014-06-22 MED ORDER — SODIUM CHLORIDE 0.9 % IV SOLN
INTRAVENOUS | Status: DC
Start: 2014-06-22 — End: 2014-06-24
  Administered 2014-06-22 – 2014-06-24 (×4): via INTRAVENOUS

## 2014-06-22 MED ORDER — SENNA 8.6 MG PO TABS
1.0000 | ORAL_TABLET | Freq: Two times a day (BID) | ORAL | Status: DC
  Administered 2014-06-23 – 2014-06-27 (×9): 8.6 mg via ORAL
  Filled 2014-06-22 (×11): qty 1

## 2014-06-22 MED ORDER — SUCCINYLCHOLINE CHLORIDE 20 MG/ML IJ SOLN
INTRAMUSCULAR | Status: AC
  Filled 2014-06-22: qty 1

## 2014-06-22 MED ORDER — LIDOCAINE HCL 4 % MT SOLN
OROMUCOSAL | Status: DC | PRN
  Administered 2014-06-22: 4 mL via TOPICAL

## 2014-06-22 MED ORDER — HEMOSTATIC AGENTS (NO CHARGE) OPTIME
TOPICAL | Status: DC | PRN
  Administered 2014-06-22: 1 via TOPICAL

## 2014-06-22 MED ORDER — PROPOFOL 10 MG/ML IV BOLUS
INTRAVENOUS | Status: DC | PRN
  Administered 2014-06-22: 120 mg via INTRAVENOUS

## 2014-06-22 MED ORDER — ALUM & MAG HYDROXIDE-SIMETH 200-200-20 MG/5ML PO SUSP
30.0000 mL | Freq: Four times a day (QID) | ORAL | Status: DC | PRN
Start: 2014-06-22 — End: 2014-06-27

## 2014-06-22 MED ORDER — SODIUM CHLORIDE 0.9 % IJ SOLN: 3.0000 mL | INTRAMUSCULAR | Status: DC | PRN

## 2014-06-22 MED ORDER — LACTATED RINGERS IV SOLN
INTRAVENOUS | Status: DC | PRN
  Administered 2014-06-22 (×3): via INTRAVENOUS

## 2014-06-22 MED ORDER — 0.9 % SODIUM CHLORIDE (POUR BTL) OPTIME
TOPICAL | Status: DC | PRN
Start: 2014-06-22 — End: 2014-06-22
  Administered 2014-06-22: 1000 mL

## 2014-06-22 SURGICAL SUPPLY — 83 items
BAG DECANTER FOR FLEXI CONT (MISCELLANEOUS) ×3 IMPLANT
BENZOIN TINCTURE PRP APPL 2/3 (GAUZE/BANDAGES/DRESSINGS) ×3 IMPLANT
BLADE CLIPPER SURG (BLADE) IMPLANT
BRUSH SCRUB EZ PLAIN DRY (MISCELLANEOUS) ×3 IMPLANT
CAP END 15X18 CORE X 2 40 0 (Neuro Prosthesis/Implant) ×2 IMPLANT
CAP END 2 TI LOCK SCREW X-CORE (Screw) ×8 IMPLANT
CONT SPEC 4OZ CLIKSEAL STRL BL (MISCELLANEOUS) ×3 IMPLANT
CORE XCORE 2 AUTO 18X21-27MM (Neuro Prosthesis/Implant) ×3 IMPLANT
COVER BACK TABLE 24X17X13 BIG (DRAPES) ×3 IMPLANT
COVER BACK TABLE 60X90IN (DRAPES) IMPLANT
DERMABOND ADVANCED (GAUZE/BANDAGES/DRESSINGS) ×1
DERMABOND ADVANCED .7 DNX12 (GAUZE/BANDAGES/DRESSINGS) ×2 IMPLANT
DRAPE C-ARM 42X72 X-RAY (DRAPES) ×3 IMPLANT
DRAPE C-ARMOR (DRAPES) ×3 IMPLANT
DRAPE LAPAROTOMY 100X72X124 (DRAPES) ×6 IMPLANT
DRAPE MICROSCOPE LEICA (MISCELLANEOUS) ×3 IMPLANT
DRAPE POUCH INSTRU U-SHP 10X18 (DRAPES) ×3 IMPLANT
DRAPE SURG 17X23 STRL (DRAPES) ×3 IMPLANT
DRSG OPSITE POSTOP 3X4 (GAUZE/BANDAGES/DRESSINGS) ×3 IMPLANT
DRSG OPSITE POSTOP 4X6 (GAUZE/BANDAGES/DRESSINGS) ×6 IMPLANT
DRSG OPSITE POSTOP 4X8 (GAUZE/BANDAGES/DRESSINGS) ×3 IMPLANT
ELECT BLADE 4.0 EZ CLEAN MEGAD (MISCELLANEOUS) ×3
ELECT REM PT RETURN 9FT ADLT (ELECTROSURGICAL) ×3
ELECTRODE BLDE 4.0 EZ CLN MEGD (MISCELLANEOUS) ×2 IMPLANT
ELECTRODE REM PT RTRN 9FT ADLT (ELECTROSURGICAL) ×2 IMPLANT
ENDCAP CORE X 2 15X18X40 0 (Neuro Prosthesis/Implant) ×1 IMPLANT
ENDCAP XCORE 2 18X18X40-4 (Cap) ×3 IMPLANT
EVACUATOR 1/8 PVC DRAIN (DRAIN) IMPLANT
GAUZE SPONGE 4X4 12PLY STRL (GAUZE/BANDAGES/DRESSINGS) ×3 IMPLANT
GAUZE SPONGE 4X4 16PLY XRAY LF (GAUZE/BANDAGES/DRESSINGS) IMPLANT
GLOVE BIO SURGEON STRL SZ 6.5 (GLOVE) ×9 IMPLANT
GLOVE BIOGEL PI IND STRL 6.5 (GLOVE) ×2 IMPLANT
GLOVE BIOGEL PI IND STRL 7.0 (GLOVE) ×6 IMPLANT
GLOVE BIOGEL PI INDICATOR 6.5 (GLOVE) ×1
GLOVE BIOGEL PI INDICATOR 7.0 (GLOVE) ×3
GLOVE ECLIPSE 9.0 STRL (GLOVE) ×9 IMPLANT
GLOVE EXAM NITRILE LRG STRL (GLOVE) IMPLANT
GLOVE EXAM NITRILE MD LF STRL (GLOVE) IMPLANT
GLOVE EXAM NITRILE XL STR (GLOVE) IMPLANT
GLOVE EXAM NITRILE XS STR PU (GLOVE) IMPLANT
GLOVE SS BIOGEL STRL SZ 6.5 (GLOVE) ×6 IMPLANT
GLOVE SUPERSENSE BIOGEL SZ 6.5 (GLOVE) ×3
GLOVE SURG SS PI 7.0 STRL IVOR (GLOVE) ×9 IMPLANT
GOWN STRL REUS W/ TWL LRG LVL3 (GOWN DISPOSABLE) ×2 IMPLANT
GOWN STRL REUS W/ TWL XL LVL3 (GOWN DISPOSABLE) ×8 IMPLANT
GOWN STRL REUS W/TWL 2XL LVL3 (GOWN DISPOSABLE) IMPLANT
GOWN STRL REUS W/TWL LRG LVL3 (GOWN DISPOSABLE) ×1
GOWN STRL REUS W/TWL XL LVL3 (GOWN DISPOSABLE) ×4
GUIDEWIRE NITINOL BEVEL TIP (WIRE) ×3 IMPLANT
HEMOSTAT POWDER SURGIFOAM 1G (HEMOSTASIS) ×3 IMPLANT
KIT BASIN OR (CUSTOM PROCEDURE TRAY) ×6 IMPLANT
KIT DILATOR XLIF 5 (KITS) ×2 IMPLANT
KIT NEEDLE NVM5 EMG ELECT (KITS) ×2 IMPLANT
KIT NEEDLE NVM5 EMG ELECTRODE (KITS) ×1
KIT ROOM TURNOVER OR (KITS) ×3 IMPLANT
KIT SURGICAL ACCESS MAXCESS 4 (KITS) ×3 IMPLANT
KIT XLIF (KITS) ×1
LIQUID BAND (GAUZE/BANDAGES/DRESSINGS) ×3 IMPLANT
NEEDLE HYPO 22GX1.5 SAFETY (NEEDLE) ×3 IMPLANT
NEEDLE I-PASS III (NEEDLE) ×3 IMPLANT
NS IRRIG 1000ML POUR BTL (IV SOLUTION) ×3 IMPLANT
PACK FOAM VITOSS 10CC (Orthopedic Implant) ×3 IMPLANT
PACK LAMINECTOMY NEURO (CUSTOM PROCEDURE TRAY) ×6 IMPLANT
PLATE TRAVERSE FIXED 35 (Plate) ×3 IMPLANT
ROD RELINE MAS ST 5.5X300MM (Rod) ×9 IMPLANT
RUBBERBAND STERILE (MISCELLANEOUS) ×6 IMPLANT
SCREW LOCK RELINE 5.5 TULIP (Screw) ×6 IMPLANT
SCREW RELINE MAS POLY 5.5X40 (Screw) ×12 IMPLANT
SCREW SET ENDCAP (Screw) ×4 IMPLANT
SCREW TRAVERSE 5.5X45MM (Screw) ×12 IMPLANT
SPONGE INTESTINAL PEANUT (DISPOSABLE) ×3 IMPLANT
SPONGE LAP 4X18 X RAY DECT (DISPOSABLE) IMPLANT
SPONGE SURGIFOAM ABS GEL SZ50 (HEMOSTASIS) ×3 IMPLANT
STRIP CLOSURE SKIN 1/2X4 (GAUZE/BANDAGES/DRESSINGS) ×6 IMPLANT
SUT VIC AB 2-0 CT1 18 (SUTURE) ×6 IMPLANT
SUT VIC AB 3-0 SH 8-18 (SUTURE) ×6 IMPLANT
SYR 20ML ECCENTRIC (SYRINGE) ×3 IMPLANT
TAPE CLOTH 3X10 TAN LF (GAUZE/BANDAGES/DRESSINGS) ×6 IMPLANT
TAPE STRIPS DRAPE STRL (GAUZE/BANDAGES/DRESSINGS) ×3 IMPLANT
TOWEL OR 17X24 6PK STRL BLUE (TOWEL DISPOSABLE) ×6 IMPLANT
TOWEL OR 17X26 10 PK STRL BLUE (TOWEL DISPOSABLE) ×6 IMPLANT
TRAY FOLEY CATH 14FRSI W/METER (CATHETERS) IMPLANT
WATER STERILE IRR 1000ML POUR (IV SOLUTION) ×3 IMPLANT

## 2014-06-22 NOTE — Anesthesia Procedure Notes (Addendum)
Procedure Name: Intubation Date/Time: 06/22/2014 2:41 PM Performed by: Lanell MatarBAKER, Taimi Towe M Pre-anesthesia Checklist: Timeout performed, Patient identified, Emergency Drugs available, Suction available and Patient being monitored Patient Re-evaluated:Patient Re-evaluated prior to inductionOxygen Delivery Method: Circle system utilized Preoxygenation: Pre-oxygenation with 100% oxygen Intubation Type: IV induction Ventilation: Mask ventilation without difficulty Laryngoscope Size: Miller and 2 Grade View: Grade I Tube type: Oral Tube size: 7.0 mm Number of attempts: 1 Airway Equipment and Method: Stylet Placement Confirmation: ETT inserted through vocal cords under direct vision,  breath sounds checked- equal and bilateral,  positive ETCO2 and CO2 detector Secured at: 22 cm Tube secured with: Tape Dental Injury: Teeth and Oropharynx as per pre-operative assessment

## 2014-06-22 NOTE — Transfer of Care (Signed)
Immediate Anesthesia Transfer of Care Note  Patient: Nichole [name]  Procedure(s) Performed: Procedure(s): Left Lumbar one Corpectomy with Fusion Thoracic twelve-Lumbar two,lateral plating,posterior percutaneous pedicle screws (Left) LUMBAR PERCUTANEOUS PEDICLE SCREW 1 LEVEL (N/A)  Patient Location: PACU  Anesthesia Type:General  Level of Consciousness: awake and alert   Airway & Oxygen Therapy: Patient Spontanous Breathing and Patient connected to nasal cannula oxygen  Post-op Assessment: Report given to PACU RN and Post -op Vital signs reviewed and stable  Post vital signs: Reviewed and stable  Complications: No apparent anesthesia complications

## 2014-06-22 NOTE — Brief Op Note (Signed)
06/13/2014 - 06/22/2014  8:28 PM  PATIENT:  Nichole [name]  68 y.o. female  PRE-OPERATIVE DIAGNOSIS:  lumbar one fracture / stenosis  POST-OPERATIVE DIAGNOSIS:  lumbar one fracture / stenosis  PROCEDURE:  Procedure(s): Left Lumbar one Corpectomy with Fusion Thoracic twelve-Lumbar two,lateral plating,posterior percutaneous pedicle screws (Left) LUMBAR PERCUTANEOUS PEDICLE SCREW 1 LEVEL (N/A)  SURGEON:  Surgeon(s) and Role:    * Temple PaciniHenry A Breyer Tejera, MD - Primary  PHYSICIAN ASSISTANT:   ASSISTANTS: Cabbell    ANESTHESIA:   general  EBL:  Total I/O In: 1700 [I.V.:1700] Out: 200 [Urine:125; Blood:75]  BLOOD ADMINISTERED:none  DRAINS: none   LOCAL MEDICATIONS USED:  NONE  SPECIMEN:  No Specimen  DISPOSITION OF SPECIMEN:  N/A  COUNTS:  YES  TOURNIQUET:  * No tourniquets in log *  DICTATION: .Dragon Dictation  PLAN OF CARE: Admit to inpatient   PATIENT DISPOSITION:  PACU - hemodynamically stable.   Delay start of Pharmacological VTE agent (>24hrs) due to surgical blood loss or risk of bleeding: yes

## 2014-06-22 NOTE — Op Note (Signed)
Date of procedure: 06/22/2014  Date of dictation: same  Service: Neurosurgery  Preoperative diagnosis: L1 burst fracture with severe stenosis and cauda equina dysfunction  Postoperative diagnosis: same  Procedure Name: left L1 retroperitoneal corpectomy. Microdissection.  Left T12-L2anterior lateral interbody fusion utilizing expandable cage instrumentation, locally harvested autograft, and bone graft extender.  T12-L2 lateral plate fixation  T12 to L3 posterior percutaneous pedicle screw fixation (nonsegmental)  Surgeon:Paco Cislo A.Rogen Porte, M.D.  Asst. Surgeon: Franky Machoabbell  Anesthesia: General  Indication:68 year old female with L1 burst fracture with severe canal compromise and intractable pain. I then asked to assume her care for operative decompression and fusion.  Operative note:after induction of anesthesia. Patient positioned in the right lateral decubitus position and appropriately padded. Planning images were taken and the patient was positioned in a laterally flexed posture. Patient's lateral thoracic flank and lumbar region was prepped and draped sterilely. Incision was made overlying the L1 fracture between the 11th and 12th ribs. A secondary incision was made in the left flank. Using blunt dissection entry into the retroperitoneal space was made and peritoneal structures were mobilized anteriorly. A dilator was then passed through the intercostal muscles into the retroperitoneal space and docked onto the T12-L1 disc space. A guidewire was placed. The retractor was then opened after various dilators had been passed. The psoas muscle was mobilized and the crura of the diaphragm was also mobilized. Segmental arteries overlying the body of L2 were identified and dissected inferiorly. Segmental arteries overlying the body of L1 were identified loculated and divided. Disc space at T12/L1 and L1/L2 were both incised with 15 blade. Discectomies were then performed using pituitary rongeurs forward  and backward I will curettes and Kerrison Rogers. Corpectomy was then performed by removing the body of L2 utilizing various curettes, pituitary rongeurs, and Kerrison Rogers. Essentially the entire L2 vertebral body was resected. Microscope was brought into the field and used throughout the remainder of the corpectomy. The retropulsed bone was carefully dissected free and elevated. Underlying thecal sac was identified. A wide decompression was then achieved extending from T12 down to L2. No evidence of any residual compression present. No evidence of injury to thecal sac and nerve roots or vascular structures. Wound is then irrigated. Distance between the endplate of T12 and L2 was then measured. A expandable interbody cage was then fashioned using theNuvasive system. A 4 endplate was chosen to interface with T12 and a 0 endplate was interfaced into L2. The cage was impacted into place under fluoroscopic guidance and was confirmed a well position in both AP and lateral fluoroscopy. The cage had been packed with morselized autograft and further augmented with Vitos  bone graft substitute.the cage was expanded into position with good contact with the endplate above and below. A 35mm lateral plate was then placed over the vertebral bodies of T12 and L2. This is then attached using 45 mm screws which were placed in a bicortical fashion. Fluoroscopy revealed good position of the cage and lateral plate. The patient's bed was returned to a neutral position. Entry sites for percutaneous pedicle screws were obtained. Pedicles of L2 were extremely small and the patient had previously had an L2-3 fusion done in the past. I decided to forego placing screws into L2 and simply place screws into T12 and L3 and splint the segment with pertaining sleep placed rods. Using fluoroscopic guidance entry sites for pedicle screws into L3 and T12 were identified bilaterally. Jamshidi's were then entered into the pedicles and guidewires  were placed. Intraoperative neural monitoring  was used throughout this part of the procedure. Each guidewire was then tapped with a 4.50 m screw tap and 5.5 x 40 mm screws were placed at T12 and L3 bilaterally. A straight rod was then passed from the screw heads from T12-L3. The system on the left side was locked down in place and well position. In the process of the locking down the screw head of L3 on the right side the screw itself pulled out. I reexamined the screw hole. I thought that it was a very low yield a try to salvage pedicle screw into this track. I thought about placing a screw into L2 but this would be difficult with her previous surgery and the new lateral plate screws. With that in mind I decided to simply rely on the unilateral pedicle screw fixation. All wounds were then closed in typical fashion. Steri-Strips and sterile dressing were applied. No apparent complication. Patient tolerated the procedure well.

## 2014-06-22 NOTE — Anesthesia Preprocedure Evaluation (Addendum)
Anesthesia Evaluation  Patient identified by MRN, date of birth, ID band Patient awake    Reviewed: Allergy & Precautions, H&P , NPO status , Patient's Chart, lab work & pertinent test results  Airway Mallampati: II       Dental   Pulmonary asthma (Inhalers) , former smoker,  breath sounds clear to auscultation        Cardiovascular hypertension, Pt. 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    Peds  Hematology   Anesthesia Other Findings   Reproductive/Obstetrics                           Anesthesia Physical Anesthesia Plan  ASA: III  Anesthesia Plan: General   Post-op Pain Management:    Induction: Intravenous  Airway Management Planned: Oral ETT  Additional Equipment:   Intra-op Plan:   Post-operative Plan: Extubation in OR  Informed Consent: I have reviewed the patients History and Physical, chart, labs and discussed the procedure including the risks, benefits and alternatives for the proposed anesthesia with the patient or authorized representative who has indicated his/her understanding and acceptance.     Plan Discussed with:   Anesthesia Plan Comments: (2nd IV after turning, check Potasium this am, K 3.7 now, could not get good 2nd peripheral IV, will place IJ in OR, also discussed possible pos-t op ventilation and ICU with daughter)       Anesthesia Quick Evaluation

## 2014-06-22 NOTE — Progress Notes (Signed)
eLink Physician-Brief Progress Note Patient Name: Nichole BankerVivian L Dayrit DOB: 10-07-1945 MRN: [id number]   Date of Service  06/22/2014  HPI/Events of Note  L1 corpectomy for burst fracture & cauda equina syndrome Htn, DM, COPD  eICU Interventions  Reviewed home meds Pain control   New ICU patient evaluation: [facility]. I have reviewed relevant documentation including care plan & orders.   Intervention Category Evaluation Type: New Patient Evaluation  ALVA,RAKESH V. 06/22/2014, 10:49 PM

## 2014-06-23 ENCOUNTER — Inpatient Hospital Stay (HOSPITAL_COMMUNITY): Payer: Medicare Other

## 2014-06-23 LAB — BASIC METABOLIC PANEL
Anion gap: 12 (ref 5–15)
BUN: 11 mg/dL (ref 6–23)
CO2: 26 mEq/L (ref 19–32)
Calcium: 8.8 mg/dL (ref 8.4–10.5)
Chloride: 100 mEq/L (ref 96–112)
Creatinine, Ser: 0.39 mg/dL — ABNORMAL LOW (ref 0.50–1.10)
GFR calc Af Amer: >90 mL/min (ref >90)
GFR calc non Af Amer: >90 mL/min (ref >90)
Glucose, Bld: 156 mg/dL — ABNORMAL HIGH (ref 70–99)
Potassium: 4.3 mEq/L (ref 3.7–5.3)
Sodium: 138 mEq/L (ref 137–147)

## 2014-06-23 LAB — CBC
HCT: 33.6 % — ABNORMAL LOW (ref 36.0–46.0)
Hemoglobin: 10.7 g/dL — ABNORMAL LOW (ref 12.0–15.0)
MCH: 27.9 pg (ref 26.0–34.0)
MCHC: 31.8 g/dL (ref 30.0–36.0)
MCV: 87.7 fL (ref 78.0–100.0)
Platelets: 439 10*3/uL — ABNORMAL HIGH (ref 150–400)
RBC: 3.83 MIL/uL — ABNORMAL LOW (ref 3.87–5.11)
RDW: 13.2 % (ref 11.5–15.5)
WBC: 17.6 10*3/uL — ABNORMAL HIGH (ref 4.0–10.5)

## 2014-06-23 NOTE — Progress Notes (Signed)
Order clarification received- Brace to be used while patient is sitting out of bed

## 2014-06-23 NOTE — Plan of Care (Signed)
Problem: Phase II Progression Outcomes Goal: Progress activity as tolerated unless otherwise ordered Outcome: Progressing Goal: Vital signs remain stable Outcome: Completed/Met Date Met:  06/23/14 Goal: Other Phase II Outcomes/Goals Outcome: Not Applicable Date Met:  14/60/47
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
## 2014-06-23 NOTE — Plan of Care (Signed)
Problem: Phase I Progression Outcomes Goal: Pain controlled with appropriate interventions Outcome: Progressing Goal: OOB as tolerated unless otherwise ordered Outcome: Progressing Goal: Incision/dressings dry and intact Outcome: Progressing Goal: Tubes/drains patent Outcome: Progressing Goal: Vital signs/hemodynamically stable Outcome: Progressing

## 2014-06-23 NOTE — Anesthesia Postprocedure Evaluation (Signed)
  Anesthesia Post-op Note  Patient: Nichole [name]  Procedure(s) Performed: Procedure(s): Left Lumbar one Corpectomy with Fusion Thoracic twelve-Lumbar two,lateral plating,posterior percutaneous pedicle screws (Left) LUMBAR PERCUTANEOUS PEDICLE SCREW 1 LEVEL (N/A)  Patient Location: PACU  Anesthesia Type:General  Level of Consciousness: awake  Airway and Oxygen Therapy: Patient Spontanous Breathing and Patient connected to nasal cannula oxygen  Post-op Pain: moderate  Post-op Assessment: Post-op Vital signs reviewed, Patient's Cardiovascular Status Stable, Respiratory Function Stable, Patent Airway, No signs of Nausea or vomiting and Pain level controlled  Post-op Vital Signs: Reviewed and stable  Last Vitals:  Filed Vitals:   06/23/14 0200  BP: 116/65  Pulse: 93  Temp:   Resp: 13    Complications: No apparent anesthesia complications

## 2014-06-23 NOTE — Progress Notes (Signed)
Postop day 1. Overall patient looks good. Complains of incisional discomfort only. No shortness of breath. No significant abdominal pain. Lower extremities feel better.  Afebrile.vital signs are stable. Urine output good. Hematocrit 33. Electrolytes normal.  Awake and alert. Oriented and appropriate. Motor examination appears intact but limited somewhat by poor effort. Sensory examination intact. Dressings clean and dry. Abdomen soft. Nontender. Nondistended. Chest clear bilaterally.  Follow-up chest x-ray looks good.  Doing well postop. Begin efforts at mobilization. Observe in  ICU through today. Transfer tomorrow morning.

## 2014-06-24 MED ORDER — SODIUM CHLORIDE 0.9 % IJ SOLN
10.0000 mL | INTRAMUSCULAR | Status: DC | PRN
  Administered 2014-06-24 – 2014-06-26 (×2): 20 mL
  Filled 2014-06-24 (×2): qty 40

## 2014-06-24 NOTE — Plan of Care (Signed)
Problem: Phase III Progression Outcomes Goal: Pain controlled on oral analgesia Outcome: Completed/Met Date Met:  06/24/14  Problem: Discharge Progression Outcomes Goal: Pain controlled with appropriate interventions Outcome: Completed/Met Date Met:  06/24/14 Goal: Hemodynamically stable Outcome: Completed/Met Date Met:  18/29/93 Goal: Complications resolved/controlled Outcome: Not Applicable Date Met:  71/69/67 Goal: Tolerating diet Outcome: Completed/Met Date Met:  06/24/14  Problem: Consults Goal: Skin Care Protocol Initiated - if Braden Score 18 or less If consults are not indicated, leave blank or document N/A  Outcome: Completed/Met Date Met:  06/24/14 Goal: Nutrition Consult-if indicated Outcome: Not Applicable Date Met:  89/38/10 Goal: Diabetes Guidelines if Diabetic/Glucose > 140 If diabetic or lab glucose is > 140 mg/dl - Initiate Diabetes/Hyperglycemia Guidelines & Document Interventions  Outcome: Not Applicable Date Met:  17/51/02  Problem: Phase I Progression Outcomes Goal: Pain controlled with appropriate interventions Outcome: Completed/Met Date Met:  06/24/14 Goal: Tubes/drains patent Outcome: Completed/Met Date Met:  06/24/14 Goal: Vital signs/hemodynamically stable Outcome: Completed/Met Date Met:  06/24/14  Problem: Phase II Progression Outcomes Goal: Vital signs stable Outcome: Completed/Met Date Met:  06/24/14 Goal: Tolerating diet Outcome: Completed/Met Date Met:  06/24/14  Problem: Phase III Progression Outcomes Goal: Pain controlled on oral analgesia Outcome: Completed/Met Date Met:  06/24/14 Goal: Nasogastric tube discontinued Outcome: Completed/Met Date Met:  06/24/14  Problem: Discharge Progression Outcomes Goal: Pain controlled with appropriate interventions Outcome: Completed/Met Date Met:  06/24/14 Goal: Hemodynamically stable Outcome: Completed/Met Date Met:  06/24/14 Goal: Tolerating diet Outcome: Completed/Met Date Met:  06/24/14

## 2014-06-24 NOTE — Progress Notes (Signed)
Overall doing well today. Pain much better controlled. States that her legs feel better. Patient was able to sit upright at the side of the bed yesterday. No complaints of numbness, paresthesias or weakness.  Afebrile. Vitals are stable. Awake and alert. Oriented and appropriate. Motor and sensory function appear intact in both upper and lower extremities. Abdomen soft. Positive flatus today. Chest clear bilaterally. Wound dressings clean and dry.  Progressing well. Transfer to floor. Continue efforts at mobilization in brace.

## 2014-06-25 LAB — TYPE AND SCREEN
ABO/RH(D): A NEG
Antibody Screen: NEGATIVE
Unit division: 0
Unit division: 0

## 2014-06-25 NOTE — Clinical Social Work Note (Addendum)
Clinical Social Worker spoke with pt's husband, Simona Huharl in reference to pt's return to [facility]. Pt's husband stated he would like pt placed at [facility] in [place]. CSW will continue to follow pt for disposition needs.  CSW has submitted clinicals and FL-2 for [facility].  Derenda FennelBashira Iraida Cragin, MSW, LCSWA [phone] 06/25/2014 10:07 AM

## 2014-06-25 NOTE — Progress Notes (Signed)
Postop day 3. Overall doing well. Back pain much improved. Patient able to stand and this made with therapy today.  Afebrile. Vitals are stable. Motor and sensory function intact. Gait still limited by left ankle fracture. Abdomen soft. Bowel sounds normal.  Progressing well following L1 corpectomy. Continue efforts at mobilization. Will need readmission to skilled nursing facility for further convalescence.

## 2014-06-25 NOTE — Progress Notes (Signed)
UR COMPLETED  
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
## 2014-06-26 NOTE — Progress Notes (Signed)
Physical medicine rehabilitation consult requested chart reviewed. Patient is currently a resident of skilled nursing facility [facility] [place]. Plan as documented his return to skilled nursing facility.Defer formal rehab consult at this time with plan SNF

## 2014-06-26 NOTE — Progress Notes (Signed)
Overall doing very well. Minimal back pain. No radiating pain. Gait and mobility still continue to be a challenge secondary to spinal and lower extremity issues.  Afebrile. Vitals are stable. Motor and sensory exam intact. Wounds clean and dry. Abdomen soft.  Overall progressing well. I think patient may be participating a high enough level now that inpatient rehabilitation as a possibility. I will contact them today for evaluation.

## 2014-06-26 NOTE — Progress Notes (Addendum)
Clinical Social Worker attempted to contact pt's husband, Simona Huharl in reference to post-acute placement. Pt was admitted from [facility] [place], however pt's husband and daughter [facility]. [facility] declined (unable to meet pt's needs). CSW left pt's husband a message of [facility] declining pt and reported pt would return to [facility] [place] upon discharge.   CSW continues to follow to facilitate disposition once pt is medically stable.  FL-2 on chart for MD signature.  Derenda FennelBashira Shanisha Lech, MSW, LCSWA [phone] 06/26/2014 2:00 PM

## 2014-06-27 ENCOUNTER — Encounter (HOSPITAL_COMMUNITY): Payer: Self-pay | Admitting: Neurosurgery

## 2014-06-27 DIAGNOSIS — N390 Urinary tract infection, site not specified: Secondary | ICD-10-CM | POA: Diagnosis not present

## 2014-06-27 DIAGNOSIS — E46 Unspecified protein-calorie malnutrition: Secondary | ICD-10-CM | POA: Diagnosis not present

## 2014-06-27 DIAGNOSIS — K5900 Constipation, unspecified: Secondary | ICD-10-CM | POA: Diagnosis not present

## 2014-06-27 DIAGNOSIS — S32009A Unspecified fracture of unspecified lumbar vertebra, initial encounter for closed fracture: Secondary | ICD-10-CM | POA: Diagnosis not present

## 2014-06-27 DIAGNOSIS — S8252XA Displaced fracture of medial malleolus of left tibia, initial encounter for closed fracture: Secondary | ICD-10-CM | POA: Diagnosis not present

## 2014-06-27 DIAGNOSIS — E876 Hypokalemia: Secondary | ICD-10-CM | POA: Diagnosis not present

## 2014-06-27 DIAGNOSIS — S92902A Unspecified fracture of left foot, initial encounter for closed fracture: Secondary | ICD-10-CM | POA: Diagnosis not present

## 2014-06-27 DIAGNOSIS — D649 Anemia, unspecified: Secondary | ICD-10-CM | POA: Diagnosis not present

## 2014-06-27 MED ORDER — DIAZEPAM 5 MG PO TABS: 5.0000 mg | ORAL_TABLET | Freq: Four times a day (QID) | ORAL | Status: DC | PRN

## 2014-06-27 MED ORDER — OXYCODONE HCL 5 MG PO TABS: 5.0000 mg | ORAL_TABLET | ORAL | Status: DC | PRN

## 2014-06-27 NOTE — Clinical Social Work Note (Signed)
Clinical Social Worker left a voice message with MD's Environmental health practitioneradministrative assistant in reference to discharge needs. CSW awaiting a returned phone call from MD.   FL-2 on chart for MD signature.   CSW will continue to follow to facilitate discharge needs.   Derenda FennelBashira Lasharn Bufkin, MSW, LCSWA [phone] 06/27/2014 10:05 AM

## 2014-06-27 NOTE — Progress Notes (Signed)
Pt discharged via stretcher to [facility] skilled nursing facility at this time. No noted distress. Pt had refused x2 prior to leaving  but eventually agreed to go. Report called in prior to discharge. Dry dressing to previous IJ site no bleeding. Honeycomb dressing to surgical site dry and intact. Ace bandage to left lower extremity in place dry and intact. Her daughter took all personal belongings to [facility].

## 2014-06-27 NOTE — Progress Notes (Signed)
Patient complained of pressure in bladder this morning. This RN used bladder scanner and saw 766 mL on scanner. After performing in and out cath, I received 900 ml from bladder. Patient had instant relief of pressure from bladder. Monia PouchShakenna Anchor Dwan, RN

## 2014-06-27 NOTE — Discharge Instructions (Signed)

## 2014-06-27 NOTE — Clinical Social Work Note (Signed)
Clinical Social Worker facilitated patient discharge including contacting patient family and facility to confirm patient discharge plans.  Clinical information faxed to facility and family agreeable with plan.  CSW arranged ambulance transport via [facility] to [facility] [place].  RN to call report prior to discharge.  Clinical Social Worker will sign off for now as social work intervention is no longer needed. Please consult us again if new need arises.  Derenda FennelBashira Rosetta Rupnow, MSW, LCSWA [phone] 06/27/2014 12:51 PM

## 2014-06-27 NOTE — Progress Notes (Signed)
IV team removed IJ for discharge. Pt lying flat and still at this time asleep. Pt stable.  No noted distress. Tolerated well. Pt to be transported to SNF [facility].  Report was called in to [place] at facility. Her daughter was in early to take all personal belongings.

## 2014-06-27 NOTE — Progress Notes (Signed)
Overall stable. No new issues or problems. Pain well controlled. Participating in therapy. Overall continues to progress well.  Afebrile. Vitals are stable. Awake and alert. Wounds clean and dry. Abdomen soft.  Not eligible for inpatient rehabilitation. Plan discharge to skilled nursing facility when bed available. Ready for discharge from my standpoint.
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
## 2014-06-27 NOTE — Clinical Social Work Note (Signed)
Clinical Social Worker attempted to contact pt's husband, Nichole [name] on home and mobile phones. CSW left a message for a returned phone call in reference to pt's return to [facility].  CSW also notified pt of return to [facility] and pt was agreeable. CSW continues to follow.   Derenda FennelBashira Cordarrell Sane, MSW, LCSWA [phone] 06/27/2014 10:44 AM

## 2014-06-27 NOTE — Progress Notes (Signed)
After discharge TLSO brace was discovered at bedside. Family contacted spoke with husband who stated that the daughter would pick up brace. [facility] nursing facility was contacted. Spoke with NP who will be contacting Neurosurgeon to get orders to how brace is to be applied. Pt did not get out of bed during shift.

## 2014-06-29 ENCOUNTER — Telehealth: Payer: Self-pay | Admitting: Family Medicine

## 2014-06-29 DIAGNOSIS — D649 Anemia, unspecified: Secondary | ICD-10-CM | POA: Diagnosis not present

## 2014-06-29 DIAGNOSIS — E46 Unspecified protein-calorie malnutrition: Secondary | ICD-10-CM | POA: Diagnosis not present

## 2014-06-29 DIAGNOSIS — N390 Urinary tract infection, site not specified: Secondary | ICD-10-CM | POA: Diagnosis not present

## 2014-06-29 DIAGNOSIS — S8252XA Displaced fracture of medial malleolus of left tibia, initial encounter for closed fracture: Secondary | ICD-10-CM | POA: Diagnosis not present

## 2014-06-29 DIAGNOSIS — K5900 Constipation, unspecified: Secondary | ICD-10-CM | POA: Diagnosis not present

## 2014-06-29 DIAGNOSIS — S32009A Unspecified fracture of unspecified lumbar vertebra, initial encounter for closed fracture: Secondary | ICD-10-CM | POA: Diagnosis not present

## 2014-06-29 DIAGNOSIS — E876 Hypokalemia: Secondary | ICD-10-CM | POA: Diagnosis not present

## 2014-06-29 DIAGNOSIS — S92902A Unspecified fracture of left foot, initial encounter for closed fracture: Secondary | ICD-10-CM | POA: Diagnosis not present
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
## 2014-06-29 NOTE — Telephone Encounter (Signed)
Nurse to spk with at length. Let her know we have zero staff privileges at [facility] and have zero privileges t to assess a patient and make recommendations or orders. (With her dementia, may be hard to convey that message) Having said that, let her know I'll drop by to say hi mid of next week. All medical managemnt must be provided the physician and clinicians on staff

## 2014-06-30 DIAGNOSIS — E46 Unspecified protein-calorie malnutrition: Secondary | ICD-10-CM | POA: Diagnosis not present

## 2014-06-30 DIAGNOSIS — S32009A Unspecified fracture of unspecified lumbar vertebra, initial encounter for closed fracture: Secondary | ICD-10-CM | POA: Diagnosis not present

## 2014-06-30 DIAGNOSIS — D649 Anemia, unspecified: Secondary | ICD-10-CM | POA: Diagnosis not present

## 2014-06-30 DIAGNOSIS — S92902A Unspecified fracture of left foot, initial encounter for closed fracture: Secondary | ICD-10-CM | POA: Diagnosis not present

## 2014-06-30 DIAGNOSIS — N390 Urinary tract infection, site not specified: Secondary | ICD-10-CM | POA: Diagnosis not present

## 2014-06-30 DIAGNOSIS — E876 Hypokalemia: Secondary | ICD-10-CM | POA: Diagnosis not present

## 2014-06-30 DIAGNOSIS — K5900 Constipation, unspecified: Secondary | ICD-10-CM | POA: Diagnosis not present

## 2014-06-30 DIAGNOSIS — S8252XA Displaced fracture of medial malleolus of left tibia, initial encounter for closed fracture: Secondary | ICD-10-CM | POA: Diagnosis not present

## 2014-07-02 DIAGNOSIS — E876 Hypokalemia: Secondary | ICD-10-CM | POA: Diagnosis not present

## 2014-07-02 DIAGNOSIS — N390 Urinary tract infection, site not specified: Secondary | ICD-10-CM | POA: Diagnosis not present

## 2014-07-02 DIAGNOSIS — R110 Nausea: Secondary | ICD-10-CM | POA: Diagnosis not present

## 2014-07-02 NOTE — Telephone Encounter (Signed)
Notified husband we have zero staff privileges at [facility] and have zero privileges to assess a patient and make recommendations or orders. Told husband Dr. Brett CanalesSteve can drop by to say hi middle of next week. All medical management must be provided the physician and clinicians on staff. Husband verbalized understanding and agreed. He states stopping by to say hi would suit them just fine.

## 2014-07-03 ENCOUNTER — Ambulatory Visit: Payer: Medicare Other | Admitting: Family Medicine

## 2014-07-04 DIAGNOSIS — E876 Hypokalemia: Secondary | ICD-10-CM | POA: Diagnosis not present

## 2014-07-11 DIAGNOSIS — R05 Cough: Secondary | ICD-10-CM | POA: Diagnosis not present

## 2014-07-19 DIAGNOSIS — S93432D Sprain of tibiofibular ligament of left ankle, subsequent encounter: Secondary | ICD-10-CM | POA: Diagnosis not present

## 2014-07-22 DIAGNOSIS — K5900 Constipation, unspecified: Secondary | ICD-10-CM | POA: Diagnosis not present

## 2014-07-22 DIAGNOSIS — R1110 Vomiting, unspecified: Secondary | ICD-10-CM | POA: Diagnosis not present

## 2014-07-22 DIAGNOSIS — R110 Nausea: Secondary | ICD-10-CM | POA: Diagnosis not present

## 2014-07-23 DIAGNOSIS — K5641 Fecal impaction: Secondary | ICD-10-CM | POA: Diagnosis not present

## 2014-07-23 DIAGNOSIS — R1110 Vomiting, unspecified: Secondary | ICD-10-CM | POA: Diagnosis not present

## 2014-07-23 DIAGNOSIS — J189 Pneumonia, unspecified organism: Secondary | ICD-10-CM | POA: Diagnosis not present

## 2014-07-25 DIAGNOSIS — J189 Pneumonia, unspecified organism: Secondary | ICD-10-CM | POA: Diagnosis not present

## 2014-07-25 DIAGNOSIS — E876 Hypokalemia: Secondary | ICD-10-CM | POA: Diagnosis not present

## 2014-07-25 DIAGNOSIS — K5900 Constipation, unspecified: Secondary | ICD-10-CM | POA: Diagnosis not present

## 2014-07-26 DIAGNOSIS — E876 Hypokalemia: Secondary | ICD-10-CM | POA: Diagnosis not present

## 2014-07-26 DIAGNOSIS — K5900 Constipation, unspecified: Secondary | ICD-10-CM | POA: Diagnosis not present

## 2014-07-26 DIAGNOSIS — J189 Pneumonia, unspecified organism: Secondary | ICD-10-CM | POA: Diagnosis not present

## 2014-07-28 DIAGNOSIS — E876 Hypokalemia: Secondary | ICD-10-CM | POA: Diagnosis not present

## 2014-07-28 DIAGNOSIS — K5900 Constipation, unspecified: Secondary | ICD-10-CM | POA: Diagnosis not present

## 2014-07-28 DIAGNOSIS — J189 Pneumonia, unspecified organism: Secondary | ICD-10-CM | POA: Diagnosis not present

## 2014-08-01 DIAGNOSIS — S32001D Stable burst fracture of unspecified lumbar vertebra, subsequent encounter for fracture with routine healing: Secondary | ICD-10-CM | POA: Diagnosis not present

## 2014-08-06 DIAGNOSIS — S8252XA Displaced fracture of medial malleolus of left tibia, initial encounter for closed fracture: Secondary | ICD-10-CM | POA: Diagnosis not present

## 2014-08-06 DIAGNOSIS — F068 Other specified mental disorders due to known physiological condition: Secondary | ICD-10-CM | POA: Diagnosis not present

## 2014-08-06 DIAGNOSIS — S32009A Unspecified fracture of unspecified lumbar vertebra, initial encounter for closed fracture: Secondary | ICD-10-CM | POA: Diagnosis not present

## 2014-08-06 DIAGNOSIS — K5900 Constipation, unspecified: Secondary | ICD-10-CM | POA: Diagnosis not present

## 2014-08-07 DIAGNOSIS — F324 Major depressive disorder, single episode, in partial remission: Secondary | ICD-10-CM | POA: Diagnosis not present

## 2014-08-13 DIAGNOSIS — M1611 Unilateral primary osteoarthritis, right hip: Secondary | ICD-10-CM | POA: Diagnosis not present

## 2014-08-13 DIAGNOSIS — M545 Low back pain: Secondary | ICD-10-CM | POA: Diagnosis not present

## 2014-08-14 DIAGNOSIS — M25552 Pain in left hip: Secondary | ICD-10-CM | POA: Diagnosis not present

## 2014-08-29 DIAGNOSIS — S32001D Stable burst fracture of unspecified lumbar vertebra, subsequent encounter for fracture with routine healing: Secondary | ICD-10-CM | POA: Diagnosis not present

## 2014-08-30 DIAGNOSIS — S93432D Sprain of tibiofibular ligament of left ankle, subsequent encounter: Secondary | ICD-10-CM | POA: Diagnosis not present

## 2014-08-30 DIAGNOSIS — S8262XD Displaced fracture of lateral malleolus of left fibula, subsequent encounter for closed fracture with routine healing: Secondary | ICD-10-CM | POA: Diagnosis not present

## 2014-08-30 DIAGNOSIS — T84498D Other mechanical complication of other internal orthopedic devices, implants and grafts, subsequent encounter: Secondary | ICD-10-CM | POA: Diagnosis not present

## 2014-09-02 DIAGNOSIS — F068 Other specified mental disorders due to known physiological condition: Secondary | ICD-10-CM | POA: Diagnosis not present

## 2014-09-02 DIAGNOSIS — R1084 Generalized abdominal pain: Secondary | ICD-10-CM | POA: Diagnosis not present

## 2014-09-02 DIAGNOSIS — R4182 Altered mental status, unspecified: Secondary | ICD-10-CM | POA: Diagnosis not present

## 2014-09-02 DIAGNOSIS — R339 Retention of urine, unspecified: Secondary | ICD-10-CM | POA: Diagnosis not present

## 2014-09-13 ENCOUNTER — Encounter: Payer: Self-pay | Admitting: Gastroenterology

## 2014-09-14 ENCOUNTER — Encounter: Payer: Self-pay | Admitting: Family Medicine

## 2014-09-14 ENCOUNTER — Ambulatory Visit (INDEPENDENT_AMBULATORY_CARE_PROVIDER_SITE_OTHER): Payer: Medicare Other | Admitting: Family Medicine

## 2014-09-14 ENCOUNTER — Telehealth: Payer: Self-pay | Admitting: Family Medicine

## 2014-09-14 VITALS — BP 136/88 | Ht 61.0 in | Wt 140.0 lb

## 2014-09-14 DIAGNOSIS — F0390 Unspecified dementia without behavioral disturbance: Secondary | ICD-10-CM

## 2014-09-14 DIAGNOSIS — F329 Major depressive disorder, single episode, unspecified: Secondary | ICD-10-CM

## 2014-09-14 DIAGNOSIS — I10 Essential (primary) hypertension: Secondary | ICD-10-CM | POA: Diagnosis not present

## 2014-09-14 DIAGNOSIS — F32A Depression, unspecified: Secondary | ICD-10-CM

## 2014-09-14 NOTE — Telephone Encounter (Signed)
Pt's daughter came by and dropped off the discharge papers from [facility]. See note on chart.
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
## 2014-09-26 DIAGNOSIS — S32001D Stable burst fracture of unspecified lumbar vertebra, subsequent encounter for fracture with routine healing: Secondary | ICD-10-CM | POA: Diagnosis not present

## 2014-09-29 ENCOUNTER — Other Ambulatory Visit: Payer: Self-pay | Admitting: Family Medicine

## 2014-10-02 NOTE — Telephone Encounter (Signed)
Not seen in Epic
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
## 2014-10-03 NOTE — Telephone Encounter (Signed)
Need this aswoc with pts chart and not duplicate chart

## 2014-10-04 DIAGNOSIS — S8262XD Displaced fracture of lateral malleolus of left fibula, subsequent encounter for closed fracture with routine healing: Secondary | ICD-10-CM | POA: Diagnosis not present

## 2014-10-08 ENCOUNTER — Encounter: Payer: Self-pay | Admitting: Family Medicine

## 2014-10-08 ENCOUNTER — Ambulatory Visit (INDEPENDENT_AMBULATORY_CARE_PROVIDER_SITE_OTHER): Payer: Medicare Other | Admitting: Family Medicine

## 2014-10-08 VITALS — BP 122/82 | Temp 98.3°F | Ht 61.0 in | Wt 142.0 lb

## 2014-10-08 DIAGNOSIS — R358 Other polyuria: Secondary | ICD-10-CM

## 2014-10-08 DIAGNOSIS — I10 Essential (primary) hypertension: Secondary | ICD-10-CM

## 2014-10-08 DIAGNOSIS — G894 Chronic pain syndrome: Secondary | ICD-10-CM | POA: Diagnosis not present

## 2014-10-08 DIAGNOSIS — R300 Dysuria: Secondary | ICD-10-CM

## 2014-10-08 DIAGNOSIS — N390 Urinary tract infection, site not specified: Secondary | ICD-10-CM

## 2014-10-08 DIAGNOSIS — R3589 Other polyuria: Secondary | ICD-10-CM

## 2014-10-08 LAB — POCT URINALYSIS DIPSTICK
Spec Grav, UA: 1.015
pH, UA: 7

## 2014-10-08 MED ORDER — HYDROCODONE-ACETAMINOPHEN 5-325 MG PO TABS: 1.0000 | ORAL_TABLET | Freq: Three times a day (TID) | ORAL | Status: DC | PRN

## 2014-10-08 MED ORDER — CIPROFLOXACIN HCL 250 MG PO TABS: 250.0000 mg | ORAL_TABLET | Freq: Two times a day (BID) | ORAL | Status: DC

## 2014-10-08 NOTE — Patient Instructions (Signed)
azostandard one three times per day. Will turn the urine yellow orange. Will help discomfort

## 2014-10-08 NOTE — Progress Notes (Signed)
   Subjective:    Patient ID: Nichole [name], female    DOB: 1946-06-09, 69 y.o.   MRN: [id number]  HPI Patient has been having urinary frequency. This has been going on for about a month. Worst at night. No pain.  Compliant with blood pressure medicine. No obvious side effects. Watching salt intake  incr freq up four or five times per night  Up several times per night for three wks  incr frequency  Burns some  Pos he is slight dysuria.  Ongoing fairly severe pain. States needs refills on medication  Review of Systems No headache no chest pain no vomiting no abdominal pain    Objective:   Physical Exam Alert mild malaise HEENT normal blood pressure good on repeat. Lungs clear. Heart regular in rhythm. Abdomen benign low back tender to palpation       Assessment & Plan:  Impression 1 UTI urinalysis 2-4 whites per high-powered field #2 hypertension good control. #3 chronic pain discussed needs medications. Plan medications refilled. Antibiotics prescribed. Urine culture. Since Medicare discussed. Recheck as scheduled. WSL

## 2014-10-09 DIAGNOSIS — G894 Chronic pain syndrome: Secondary | ICD-10-CM | POA: Insufficient documentation

## 2014-10-10 LAB — URINE CULTURE: Colony Count: >=100000

## 2014-11-14 ENCOUNTER — Telehealth: Payer: Self-pay | Admitting: Family Medicine

## 2014-11-14 ENCOUNTER — Other Ambulatory Visit: Payer: Self-pay | Admitting: *Deleted

## 2014-11-14 NOTE — Telephone Encounter (Signed)
Patient requesting Rx for 10 mg oxycodone.  She says that the last time she was given this Rx she was given a 5 mg Rx and its supposed to be 10 mg.

## 2014-11-14 NOTE — Telephone Encounter (Signed)
I explained to patient that she has not been on 10 mg of oxycodone or hydrocodone. Pt said that she is taking the 5mg  of hydrocodone 1 am and 1 pm. It is not touching her pain. Would like something stronger.

## 2014-11-23 NOTE — Telephone Encounter (Signed)
Will only disc potential rasiing of strength of med in a face to face visit--this is a general iron clad rule with pain specialists  (and partic in this pt with dementia but do not tell her that)

## 2014-11-23 NOTE — Telephone Encounter (Signed)
Patient has an appointment on 12/11/14 at 2 pm and she said she will talk with Dr. Brett CanalesSteve concerning this then.

## 2014-12-11 ENCOUNTER — Ambulatory Visit (INDEPENDENT_AMBULATORY_CARE_PROVIDER_SITE_OTHER): Payer: Medicare Other | Admitting: Family Medicine

## 2014-12-11 ENCOUNTER — Encounter: Payer: Self-pay | Admitting: Family Medicine

## 2014-12-11 VITALS — BP 112/72 | Ht 61.0 in | Wt 138.4 lb

## 2014-12-11 DIAGNOSIS — I10 Essential (primary) hypertension: Secondary | ICD-10-CM | POA: Diagnosis not present

## 2014-12-11 DIAGNOSIS — F329 Major depressive disorder, single episode, unspecified: Secondary | ICD-10-CM

## 2014-12-11 DIAGNOSIS — F32A Depression, unspecified: Secondary | ICD-10-CM

## 2014-12-11 DIAGNOSIS — G894 Chronic pain syndrome: Secondary | ICD-10-CM | POA: Diagnosis not present

## 2014-12-11 DIAGNOSIS — F0390 Unspecified dementia without behavioral disturbance: Secondary | ICD-10-CM

## 2014-12-11 MED ORDER — OXYCODONE-ACETAMINOPHEN 5-325 MG PO TABS
1.0000 | ORAL_TABLET | Freq: Three times a day (TID) | ORAL | Status: DC | PRN
Start: 2014-12-11 — End: 2014-12-11

## 2014-12-11 MED ORDER — OXYCODONE-ACETAMINOPHEN 5-325 MG PO TABS: 1.0000 | ORAL_TABLET | Freq: Three times a day (TID) | ORAL | Status: DC | PRN
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
## 2014-12-13 ENCOUNTER — Telehealth: Payer: Self-pay | Admitting: Family Medicine

## 2014-12-13 NOTE — Telephone Encounter (Signed)
Await Dr Soyla DryerSteve's advice per Dr Lorin PicketScott.

## 2014-12-13 NOTE — Telephone Encounter (Signed)
Pt was seen 12/11/14 by Dr. Brett CanalesSteve, he changed her pain medicine to Oxycodone/APAP 5-325, pt states she can not tolerate this, very nauseous and vomiting since taking, took one yesterday and one today - having nausea and vomiting afterwards each time  Would like to be changed back to the Hydrocodone/APAP 5-325 that she states worked well for her and she seemed to not understand why he changed it.  OV note is not complete.  Please advise   (Explained to patient that Dr. Brett CanalesSteve is not here today and that if she does not receive a call back today then Dr. Lorin PicketScott would have left this message for Dr. Brett CanalesSteve to answer tomorrow)

## 2014-12-13 NOTE — Telephone Encounter (Signed)
It was changed because pt was requesting a change to oxy tens sand i did not want to put her on that strong, will change back but pt has to bring all of it in to be destroyed

## 2014-12-14 ENCOUNTER — Other Ambulatory Visit: Payer: Self-pay | Admitting: *Deleted

## 2014-12-14 MED ORDER — HYDROCODONE-ACETAMINOPHEN 5-325 MG PO TABS: ORAL_TABLET | ORAL | Status: DC

## 2014-12-14 NOTE — Telephone Encounter (Signed)
Ok back to [employer]od rx

## 2014-12-14 NOTE — Telephone Encounter (Signed)
Scripts ready for pickup at nurse's station. Needs to bring back bottle of oxycodone before giving scripts. Pt notified. Pt last filled oxycodone on 12/11/14

## 2014-12-14 NOTE — Telephone Encounter (Signed)
Discussed with pt. Pt states she will bring back bottle of oxy. The other 2 scripts are at [facility]. Called [facility] and they did have two scripts for the oxycodone. They D/C the scripts. [facility] need [facility] call back to ok the fill today of the hydrocodone. If you approve for her to fill today.

## 2015-01-11 ENCOUNTER — Other Ambulatory Visit: Payer: Self-pay | Admitting: Family Medicine

## 2015-01-11 NOTE — Telephone Encounter (Signed)
wis 

## 2015-01-18 ENCOUNTER — Other Ambulatory Visit: Payer: Self-pay | Admitting: Family Medicine

## 2015-02-21 ENCOUNTER — Ambulatory Visit (INDEPENDENT_AMBULATORY_CARE_PROVIDER_SITE_OTHER): Payer: Medicare Other | Admitting: Family Medicine

## 2015-02-21 ENCOUNTER — Encounter: Payer: Self-pay | Admitting: Family Medicine

## 2015-02-21 VITALS — BP 138/80 | Temp 98.2°F | Ht 61.0 in

## 2015-02-21 DIAGNOSIS — F0390 Unspecified dementia without behavioral disturbance: Secondary | ICD-10-CM | POA: Diagnosis not present

## 2015-02-21 DIAGNOSIS — H8110 Benign paroxysmal vertigo, unspecified ear: Secondary | ICD-10-CM | POA: Diagnosis not present

## 2015-02-21 DIAGNOSIS — I10 Essential (primary) hypertension: Secondary | ICD-10-CM | POA: Diagnosis not present

## 2015-02-21 DIAGNOSIS — G894 Chronic pain syndrome: Secondary | ICD-10-CM

## 2015-02-21 MED ORDER — HYDROCODONE-ACETAMINOPHEN 5-325 MG PO TABS: ORAL_TABLET | ORAL | Status: DC

## 2015-02-21 MED ORDER — MECLIZINE HCL 25 MG PO TABS: 25.0000 mg | ORAL_TABLET | Freq: Three times a day (TID) | ORAL | Status: DC | PRN

## 2015-02-21 MED ORDER — ALBUTEROL SULFATE HFA 108 (90 BASE) MCG/ACT IN AERS: 2.0000 | INHALATION_SPRAY | Freq: Four times a day (QID) | RESPIRATORY_TRACT | Status: DC | PRN

## 2015-02-21 NOTE — Progress Notes (Signed)
   Subjective:    Patient ID: Nichole [name], female    DOB: 1946/07/18, 69 y.o.   MRN: [id number]  HPIDizziness for the past 6 months. Comes and goes. Feels like room is spinning. Some nausea.  Staggering when walking. All of a sudden feels like going to fall.spiinning sens ation an   No major hx of this in the past  Left ankle swelling since surgery in November. Pain when walking.   Needs refill on albuterol inhaler. Feels occas wheezing but not often . Wheezing and frequent  No substantial headache. History complicated by patient's dementia. When symptoms hit they last for a number of minutes sometimes longer.  No drainage cough congestion sore throat.  Pain with ankle post orthopedic complications with fracture/surgery. Some swollen after on it all day long.  Review of Systems No headache no chest pain no back pain no abdominal pain no change in bowel habits no blood in stool    Objective:   Physical Exam  Alert no acute distress talkative HEENT normal neck supple lungs clear heart regular in rhythm cerebellar findings within normal limits left ankle somewhat swollen good range of motion no discrete tenderness this morning      Assessment & Plan:  Impression 1 vertigo discussed at length likely benign in nature though certainly aggravating #2 left ankle pain normal for post surgical orthopedic status discussed #3 reactive airways generally stable plan refill inhaler. Antivert when necessary warning signs discussed. Follow-up regular checkup. Pain medication also refilled since next visit due very soon. WSL

## 2015-03-12 ENCOUNTER — Ambulatory Visit: Payer: Medicare Other | Admitting: Family Medicine

## 2015-03-15 ENCOUNTER — Ambulatory Visit: Payer: Medicare Other | Admitting: Family Medicine

## 2015-03-22 ENCOUNTER — Other Ambulatory Visit: Payer: Self-pay | Admitting: Family Medicine

## 2015-03-22 NOTE — Telephone Encounter (Signed)
Ok plus 5 ref 

## 2015-04-16 ENCOUNTER — Other Ambulatory Visit: Payer: Self-pay | Admitting: Family Medicine

## 2015-04-18 ENCOUNTER — Telehealth: Payer: Self-pay | Admitting: Family Medicine

## 2015-04-18 NOTE — Telephone Encounter (Signed)
Requesting Rx for HYDROcodone-acetaminophen (NORCO/VICODIN) 5-325 MG per tablet °

## 2015-04-18 NOTE — Telephone Encounter (Signed)
Pt has dementia and hx not reliable we should have given her three full rx's to last til f u visit in oct??

## 2015-04-19 NOTE — Telephone Encounter (Signed)
Patient was given 3 scripts of Hydrocodone at last visit dated 03/14/15, 04/13/15, and 05/13/15- Husband notified

## 2015-05-09 ENCOUNTER — Other Ambulatory Visit: Payer: Self-pay | Admitting: Family Medicine

## 2015-05-09 NOTE — Telephone Encounter (Signed)
Name refill medication 1

## 2015-05-14 ENCOUNTER — Ambulatory Visit (INDEPENDENT_AMBULATORY_CARE_PROVIDER_SITE_OTHER): Payer: Medicare Other | Admitting: Family Medicine

## 2015-05-14 ENCOUNTER — Encounter: Payer: Self-pay | Admitting: Family Medicine

## 2015-05-14 VITALS — BP 122/70 | Ht 61.0 in | Wt 155.0 lb

## 2015-05-14 DIAGNOSIS — M25572 Pain in left ankle and joints of left foot: Secondary | ICD-10-CM

## 2015-05-14 DIAGNOSIS — I10 Essential (primary) hypertension: Secondary | ICD-10-CM | POA: Diagnosis not present

## 2015-05-14 DIAGNOSIS — Z23 Encounter for immunization: Secondary | ICD-10-CM

## 2015-05-14 DIAGNOSIS — R2681 Unsteadiness on feet: Secondary | ICD-10-CM | POA: Diagnosis not present

## 2015-05-14 MED ORDER — HYDROCODONE-ACETAMINOPHEN 5-325 MG PO TABS: ORAL_TABLET | ORAL | Status: DC

## 2015-05-14 MED ORDER — DILTIAZEM HCL ER COATED BEADS 240 MG PO CP24: 240.0000 mg | ORAL_CAPSULE | Freq: Every day | ORAL | Status: DC

## 2015-05-14 MED ORDER — DOXAZOSIN MESYLATE 2 MG PO TABS: 2.0000 mg | ORAL_TABLET | Freq: Every day | ORAL | Status: DC

## 2015-05-14 MED ORDER — FLUOXETINE HCL 20 MG PO CAPS: ORAL_CAPSULE | ORAL | Status: DC
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
## 2015-05-20 ENCOUNTER — Other Ambulatory Visit: Payer: Self-pay | Admitting: *Deleted

## 2015-05-20 MED ORDER — ALBUTEROL SULFATE HFA 108 (90 BASE) MCG/ACT IN AERS: 2.0000 | INHALATION_SPRAY | Freq: Four times a day (QID) | RESPIRATORY_TRACT | Status: DC | PRN

## 2015-05-22 ENCOUNTER — Other Ambulatory Visit: Payer: Self-pay | Admitting: Family Medicine

## 2015-05-23 ENCOUNTER — Other Ambulatory Visit: Payer: Self-pay | Admitting: *Deleted

## 2015-05-23 MED ORDER — ALBUTEROL SULFATE HFA 108 (90 BASE) MCG/ACT IN AERS: 2.0000 | INHALATION_SPRAY | Freq: Four times a day (QID) | RESPIRATORY_TRACT | Status: DC | PRN

## 2015-05-28 ENCOUNTER — Ambulatory Visit (HOSPITAL_COMMUNITY)
Admission: RE | Admit: 2015-05-28 | Discharge: 2015-05-28 | Disposition: A | Discharge status: Home / Self Care | Payer: Medicare Other | Source: Ambulatory Visit | Attending: Family Medicine | Admitting: Family Medicine

## 2015-05-28 DIAGNOSIS — G319 Degenerative disease of nervous system, unspecified: Secondary | ICD-10-CM | POA: Diagnosis not present

## 2015-05-28 DIAGNOSIS — M25572 Pain in left ankle and joints of left foot: Secondary | ICD-10-CM | POA: Diagnosis not present

## 2015-05-28 DIAGNOSIS — I739 Peripheral vascular disease, unspecified: Secondary | ICD-10-CM | POA: Diagnosis not present

## 2015-05-28 DIAGNOSIS — R2681 Unsteadiness on feet: Secondary | ICD-10-CM | POA: Insufficient documentation

## 2015-05-28 DIAGNOSIS — M7989 Other specified soft tissue disorders: Secondary | ICD-10-CM | POA: Diagnosis not present

## 2015-05-28 DIAGNOSIS — M19172 Post-traumatic osteoarthritis, left ankle and foot: Secondary | ICD-10-CM | POA: Diagnosis not present

## 2015-06-03 ENCOUNTER — Ambulatory Visit (INDEPENDENT_AMBULATORY_CARE_PROVIDER_SITE_OTHER): Payer: Medicare Other | Admitting: Family Medicine

## 2015-06-03 ENCOUNTER — Encounter: Payer: Self-pay | Admitting: Family Medicine

## 2015-06-03 VITALS — BP 148/82 | Ht 61.0 in | Wt 149.0 lb

## 2015-06-03 DIAGNOSIS — F0280 Dementia in other diseases classified elsewhere without behavioral disturbance: Secondary | ICD-10-CM

## 2015-06-03 DIAGNOSIS — G308 Other Alzheimer's disease: Secondary | ICD-10-CM

## 2015-06-03 MED ORDER — HYDROCODONE-ACETAMINOPHEN 5-325 MG PO TABS: ORAL_TABLET | ORAL | Status: DC

## 2015-06-03 MED ORDER — DONEPEZIL HCL 5 MG PO TBDP
5.0000 mg | ORAL_TABLET | ORAL | Status: DC
Start: 2015-06-03 — End: 2015-06-08

## 2015-06-03 NOTE — Progress Notes (Signed)
   Subjective:    Patient ID: Nichole [name], female    DOB: [birth year], 69 y.o.   MRN: [id number] Patient arrives office for a very protracted discussion with her husband.  She has been working under a tentative diagnosis through the management of her neurosurgeon now for several years. She recently had a CT scan with a questionable "mass" on the scan. Has not had one since. Patient has developed progressive difficulty with mentation. Next  Worsening forgetfulness.  Worsening challenges with awareness judgment. Next  Short-term memory is just about shot. According to the family.  Patient very frustrated by all this. HPIFollow up MRI and xray results. Dementia and unsteady gait.    Full MMSE was performed which revealed a level of 20.  Requesting refill on hyrdrocodone. Takes for back pain and left foot pain.   MMSE 20/30  Patient does have gait abnormalities which of progressed over time. However no visual or audio hallucinations.   nild dem per resuts   Review of Systems No chest pain no headache no back pain positive ache ankle pain alert but    Objective:   Physical Exam  Alert vitals stable. H&T normal. Lungs clear heart regular in rhythm no focal neurological deficits gait somewhat wide-based ankle swollen still some tenderness with flexion    Assessment & Plan:  Impression 1 dementia discussed at length. Likely represents Alzheimer's. Discussed patient has not tried medication yet. I performed a MMSE patient in the presence of the patient in family. At least 8 or 10 questions answered in regards of dementia. Patient and family willing to try medicine. 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 Plan trial of Aricept rationale discussed. Nature posttraumatic arthritis discuss. This is a very first time the family had heard the term Alzheimer's disease and had numerous good questions easily 40 minutes spent most in discussion follow-up as scheduled. There  are some gait abnormalities which may this somewhat atypical. Will reassess once again on follow-up. May still need neurology input discussed with family WSL

## 2015-06-06 ENCOUNTER — Other Ambulatory Visit: Payer: Self-pay | Admitting: *Deleted

## 2015-06-06 MED ORDER — ALBUTEROL SULFATE HFA 108 (90 BASE) MCG/ACT IN AERS: 2.0000 | INHALATION_SPRAY | Freq: Four times a day (QID) | RESPIRATORY_TRACT | Status: DC | PRN

## 2015-06-08 ENCOUNTER — Other Ambulatory Visit: Payer: Self-pay | Admitting: Family Medicine

## 2015-06-09 DIAGNOSIS — F0280 Dementia in other diseases classified elsewhere without behavioral disturbance: Secondary | ICD-10-CM | POA: Insufficient documentation

## 2015-06-09 DIAGNOSIS — G309 Alzheimer's disease, unspecified: Secondary | ICD-10-CM

## 2015-06-11 ENCOUNTER — Other Ambulatory Visit: Payer: Self-pay | Admitting: Family Medicine

## 2015-06-11 NOTE — Telephone Encounter (Signed)
Ok six mo 

## 2015-07-02 ENCOUNTER — Other Ambulatory Visit: Payer: Self-pay | Admitting: Family Medicine

## 2015-07-08 ENCOUNTER — Ambulatory Visit (INDEPENDENT_AMBULATORY_CARE_PROVIDER_SITE_OTHER): Payer: Medicare Other | Admitting: Family Medicine

## 2015-07-08 ENCOUNTER — Encounter: Payer: Self-pay | Admitting: Family Medicine

## 2015-07-08 VITALS — BP 108/68 | Ht 61.0 in | Wt 147.0 lb

## 2015-07-08 DIAGNOSIS — F03918 Unspecified dementia, unspecified severity, with other behavioral disturbance: Secondary | ICD-10-CM

## 2015-07-08 DIAGNOSIS — F0391 Unspecified dementia with behavioral disturbance: Secondary | ICD-10-CM | POA: Diagnosis not present

## 2015-07-08 MED ORDER — DONEPEZIL HCL 10 MG PO TBDP: ORAL_TABLET | ORAL | Status: DC

## 2015-07-08 MED ORDER — ALPRAZOLAM 1 MG PO TABS: ORAL_TABLET | ORAL | Status: DC

## 2015-07-08 NOTE — Progress Notes (Signed)
   Subjective:    Patient ID: Nichole BankerVivian L Bogdon, female    DOB: [birth year], 69 y.o.   MRN: [id number]  HPIpt arrives today to follow up on Alzheimer's disease and unsteady gait.   Husband would like to know which meds she should take in the am and which ones in the evening.   Patient's dementia often is wavering. So much so the patient's husband expresses he is not sure whether she is taking it at times or not. At times she surprisingly clear. And other times she is very significantly confused.  Patient and husband also been expresses concern of progressive difficulty with ambulation. Patient very unsteady on her feet. She has fell recently. Stubborn and does not want to use a Ruff. Next  Still having substantial pain and states she definitely needs her pain medication. Patient has no hallucinations Review of Systems    no current headache no chest pain no back pain fair appetite ROS otherwise negative Objective:   Physical Exam  Alert vitals stable blood pressure good on repeat patient pleasant talkative oriented 2 today. Lungs clear. Heart regular in rhythm. Observation of gait shows slow gait unsteady. No obvious cogwheel rigidity. No obvious tremor      Assessment & Plan:  Impression dementia has noted before I inherited this patient's management of this dementia. She was already managed by Dr. Tia Maskerowley for this up and median. She had a working diagnosis of Alzheimer's disease. I'm starting to wonder if the patient may not have a Lewy body disease variant. Discussed with family. Also husband went to great length to expresses frustration and fatigue and trying to care for his wife. Possible resources discussed. plan referral to neurologist. Rationale discussed. Maintain same pain medication. Medications clarified. Discussed at length. Also substantial stress of husband discussed at length. 40 minutes spent most in discussion., Management, and intervention planning and discussion of need for  referral family support etc. WSL

## 2015-07-09 ENCOUNTER — Telehealth: Payer: Self-pay | Admitting: *Deleted

## 2015-07-09 MED ORDER — MECLIZINE HCL 25 MG PO TABS: 25.0000 mg | ORAL_TABLET | Freq: Three times a day (TID) | ORAL | Status: DC | PRN

## 2015-07-09 NOTE — Telephone Encounter (Signed)
Rx sent electronically to pharmacy. 

## 2015-07-09 NOTE — Telephone Encounter (Signed)
Ok six ref 

## 2015-07-09 NOTE — Telephone Encounter (Signed)
Needs refill on meclizine. [facility]. Pt does not need a call back. Seen yesterday.

## 2015-07-24 ENCOUNTER — Telehealth: Payer: Self-pay | Admitting: Family Medicine

## 2015-07-24 ENCOUNTER — Ambulatory Visit: Payer: Medicare Other | Admitting: Diagnostic Neuroimaging

## 2015-07-24 NOTE — Telephone Encounter (Signed)
pts family calling to see if they can get her mom a quad cane Can we write a script an send it to [facility] please   Pt needs for walking around her home mostly, hard to use [place]  Around the house   [phone] Georgeanne NimCheryl Spangenberger (daughter)

## 2015-07-25 ENCOUNTER — Encounter: Payer: Self-pay | Admitting: Diagnostic Neuroimaging

## 2015-07-26 NOTE — Telephone Encounter (Signed)
Was this done?

## 2015-07-29 NOTE — Telephone Encounter (Signed)
wis quad cane rx

## 2015-07-29 NOTE — Telephone Encounter (Signed)
Script faxed tried to call daughter voicemail box was full.

## 2015-07-29 NOTE — Telephone Encounter (Signed)
Script awaiting signature.

## 2015-08-07 ENCOUNTER — Ambulatory Visit (INDEPENDENT_AMBULATORY_CARE_PROVIDER_SITE_OTHER): Payer: Medicare Other | Admitting: Diagnostic Neuroimaging

## 2015-08-07 ENCOUNTER — Encounter: Payer: Self-pay | Admitting: Diagnostic Neuroimaging

## 2015-08-07 VITALS — BP 102/61 | HR 49 | Ht 60.0 in | Wt 150.0 lb

## 2015-08-07 DIAGNOSIS — F0391 Unspecified dementia with behavioral disturbance: Secondary | ICD-10-CM | POA: Diagnosis not present

## 2015-08-07 DIAGNOSIS — F03B18 Unspecified dementia, moderate, with other behavioral disturbance: Secondary | ICD-10-CM | POA: Insufficient documentation
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
## 2015-08-07 NOTE — Patient Instructions (Signed)
Thank you for coming to see [place] at [facility]. I hope we have been able to provide you high quality care today.  You may receive a patient satisfaction survey over the next few weeks. We would appreciate your feedback and comments so that we may continue to improve ourselves and the health of our patients.  - consider palliative care consult - consider home health agency referral - caution with safety and supervision and medications   ~~~~~~~~~~~~~~~~~~~~~~~~~~~~~~~~~~~~~~~~~~~~~~~~~~~~~~~~~~~~~~~~~  DR. Mikah Poss'S GUIDE TO HAPPY AND HEALTHY LIVING These are some of my general health and wellness recommendations. Some of them may apply to you better than others. Please use common sense as you try these suggestions and feel free to ask me any questions.   ACTIVITY/FITNESS Mental, social, emotional and physical stimulation are very important for brain and body health. Try learning a new activity (arts, music, language, sports, games).  Keep moving your body to the best of your abilities. You can do this at home, inside or outside, the park, community center, gym or anywhere you like. Consider a physical therapist or personal trainer to get started. Consider the app Sworkit. Fitness trackers such as smart-watches, smart-phones or Fitbits can help as well.   NUTRITION Eat more plants: colorful vegetables, nuts, seeds and berries.  Eat less sugar, salt, preservatives and processed foods.  Avoid toxins such as cigarettes and alcohol.  Drink water when you are thirsty. Warm water with a slice of lemon is an excellent morning drink to start the day.  Consider these websites for more information The Nutrition Source (https://www.henry-hernandez.biz/) Precision Nutrition (WindowBlog.ch)   RELAXATION Consider practicing mindfulness meditation or other relaxation techniques such as deep breathing, prayer, yoga, tai chi, massage.  See website mindful.org or the apps Headspace or Calm to help get started.   SLEEP Try to get at least 7-8+ hours sleep per day. Regular exercise and reduced caffeine will help you sleep better. Practice good sleep hygeine techniques. See website sleep.org for more information.   PLANNING Prepare estate planning, living will, healthcare POA documents. Sometimes this is best planned with the help of an attorney. Theconversationproject.org and agingwithdignity.org are excellent resources.

## 2015-08-21 ENCOUNTER — Other Ambulatory Visit: Payer: Self-pay | Admitting: Family Medicine

## 2015-09-06 ENCOUNTER — Ambulatory Visit: Payer: Medicare Other | Admitting: Family Medicine

## 2015-09-09 ENCOUNTER — Ambulatory Visit (INDEPENDENT_AMBULATORY_CARE_PROVIDER_SITE_OTHER): Payer: Medicare Other | Admitting: Family Medicine

## 2015-09-09 ENCOUNTER — Encounter: Payer: Self-pay | Admitting: Family Medicine

## 2015-09-09 VITALS — BP 112/70 | Ht 61.0 in | Wt 154.0 lb

## 2015-09-09 DIAGNOSIS — F0391 Unspecified dementia with behavioral disturbance: Secondary | ICD-10-CM

## 2015-09-09 DIAGNOSIS — G894 Chronic pain syndrome: Secondary | ICD-10-CM

## 2015-09-09 DIAGNOSIS — J441 Chronic obstructive pulmonary disease with (acute) exacerbation: Secondary | ICD-10-CM | POA: Diagnosis not present

## 2015-09-09 DIAGNOSIS — F03918 Unspecified dementia, unspecified severity, with other behavioral disturbance: Secondary | ICD-10-CM

## 2015-09-09 MED ORDER — HYDROCODONE-ACETAMINOPHEN 5-325 MG PO TABS: ORAL_TABLET | ORAL | Status: DC

## 2015-09-09 MED ORDER — AZITHROMYCIN 250 MG PO TABS: ORAL_TABLET | ORAL | Status: DC

## 2015-09-09 MED ORDER — PREDNISONE 10 MG PO TABS: ORAL_TABLET | ORAL | Status: DC

## 2015-09-09 NOTE — Progress Notes (Signed)
   Subjective:    Patient ID: Nichole [name], female    DOB: July 31, 1946, 70 y.o.   MRN: [id number] Patient arrives office for follow-up of several distinct concerns. HPI Patient is here today for a follow up on dementia and alzheimer's disease. Complete neurologist no reviewed today and presents of family. Was evaluated felt to have dementia with mood disturbance. No mention of potential locally body disease   Patient states tha, off and on with the cough and congeston for three wkst she has a cough that has been present for about 2-3 weeks.  Cough is productive. Wheezing at times. Using albuterol up to 4 times a day.  Ongoing back and leg pain. Substantial nature. Definitely needs a hydrocodone. Family often given just a half a tablet. With fair control symptoms   Dizziness noted also. Treatments tried: none  Patient has no other concerns.   Review of Systems No headache no chest pain positive back pain no shortness of breath other than 1 wheezing    Objective:   Physical Exam  Alert vitals stable mild malaise lungs bilateral wheezes cough during exam heart rare rhythm H&T normal low back tender to percussion negative straight leg raise ankles trace edema mentation stable      Assessment & Plan:  Impression dementia appreciate neurology assessment. Still wonder if Lillie body may be element with bradykinesia fluctuating symptoms and gait abnormality. Pre-much an academic question since therapies similar #2 exacerbation of reactive airways with bronchitis discussed #3 chronic pain with definite need for meds discussed and reviewed plan all medications refilled. Diet exercise discussed. Steroid taper. Antibiotics prescribed. Use albuterol 4 times a day warning signs discussed as far as breathing WSL

## 2015-09-23 ENCOUNTER — Other Ambulatory Visit: Payer: Self-pay | Admitting: Family Medicine

## 2015-09-24 ENCOUNTER — Telehealth: Payer: Self-pay | Admitting: Family Medicine

## 2015-09-24 NOTE — Telephone Encounter (Signed)
i can dic this letter, i think appropriate with this pts level of debility

## 2015-09-24 NOTE — Telephone Encounter (Signed)
Husband [name]) called wanting letter on patient and her physical/mental condition. He states needs it for legal matter. He didn't go into detail and I explained to him he might have to consult with doctor to explain what he needs in letter since he didn't know how to explain over the phone. I told him we would contact him to schedule appointment if that's what her doctor decides to do.

## 2015-09-24 NOTE — Telephone Encounter (Signed)
Obviously hard to dict letter based on what erica learned, ntsw and discuss and make judgement whether needs appt or we can do based on current knowledge

## 2015-09-24 NOTE — Telephone Encounter (Signed)
Spoke with patient's husband and patient's husband stated that he needed a letter dictated for family lawyer to help prove that patient is legally incompetent in making decisions for herself. States letter needs to have all of her current health issues such as her dementia listed. Needs letter as soon as possible. Patient was recently seen on 09/09/15 for dementia so I am not sure if you would like to have patient come back in to discuss or if you can dictate this letter based off of your recent visit. Please advise?

## 2015-09-25 NOTE — Telephone Encounter (Signed)
Called and spoke with patient's husband and informed him per Dr.Steve Luking-Letter will be ready by Friday. Patient's husband verbalized understanding.

## 2015-09-25 NOTE — Telephone Encounter (Signed)
Letter will be ready by Friday, unrealistic to expect this in 24 hrs

## 2015-09-25 NOTE — Telephone Encounter (Signed)
Pt's husband called to check on status of letter, he would like to pick it up today if at all possible   (explained that Dr. Brett Canales agreed to do letter and would be working on it )  Also if when calling pt, husband requests that we not leave much information with pt due to her memory issues and she may not relay information correctly

## 2015-09-27 ENCOUNTER — Encounter: Payer: Self-pay | Admitting: Family Medicine

## 2015-09-27 NOTE — Telephone Encounter (Signed)
Ok, letter finished and routed to [employer]

## 2015-09-27 NOTE — Telephone Encounter (Signed)
Spoke with patient's husband and informed him per Dr.Steve Luking- letter is finished and ready for pick up. Patient's husband verbalized understanding.

## 2015-10-07 ENCOUNTER — Other Ambulatory Visit: Payer: Self-pay | Admitting: Family Medicine

## 2015-10-16 IMAGING — CT CT ABD-PELV W/O CM
2 of 4 series · 17 of 46 positions shown, 19 images · non-contrast
Comparison: 05/29/2014

CLINICAL DATA: Right lower quadrant abdominal pain.

EXAM:
CT ABDOMEN AND PELVIS WITHOUT CONTRAST
TECHNIQUE: Multidetector CT imaging of the abdomen and pelvis was performed
following the standard protocol without IV contrast.

[Series 2: abdomen/pelvis w/o contrast · axial · non-contrast · 0.75mm/px · z∈[-474,-74]mm · 14 of 88 slices shown, 16 images]
[im 4/88  soft-tissue]
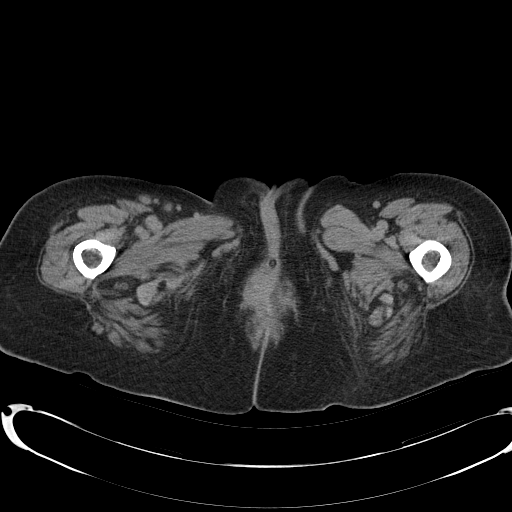
[im 4/88  bone]
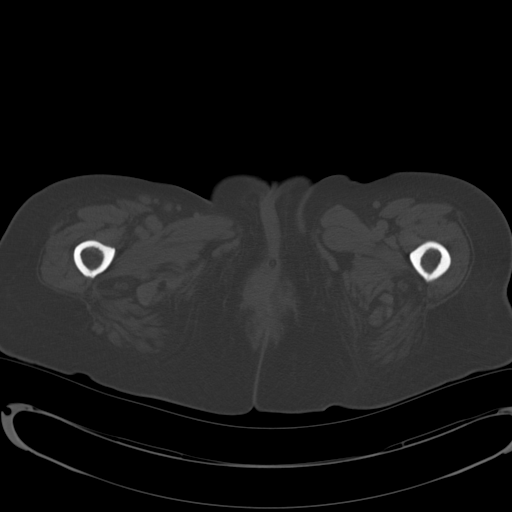
[im 11/88  soft-tissue]
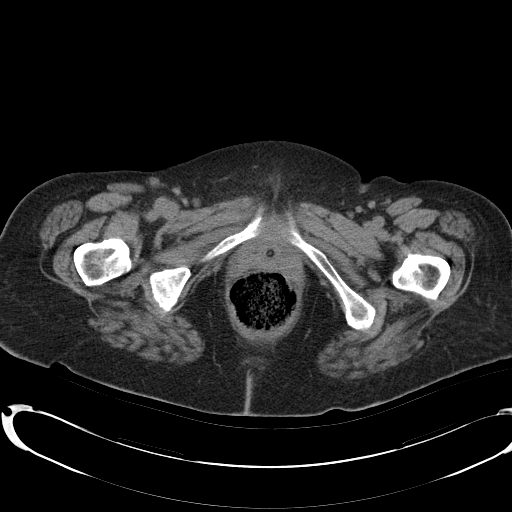
[im 19/88  soft-tissue]
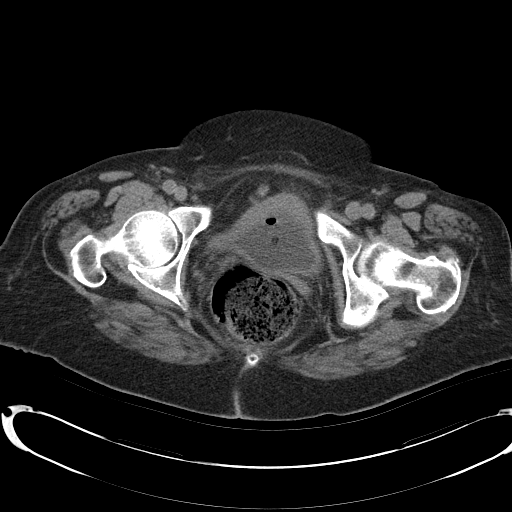
[im 22/88  soft-tissue]
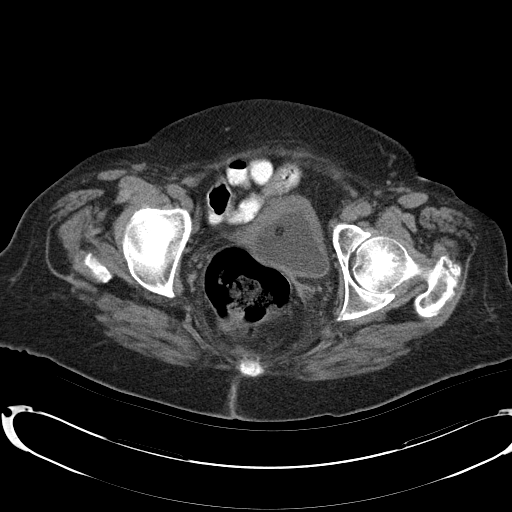
[im 30/88  soft-tissue]
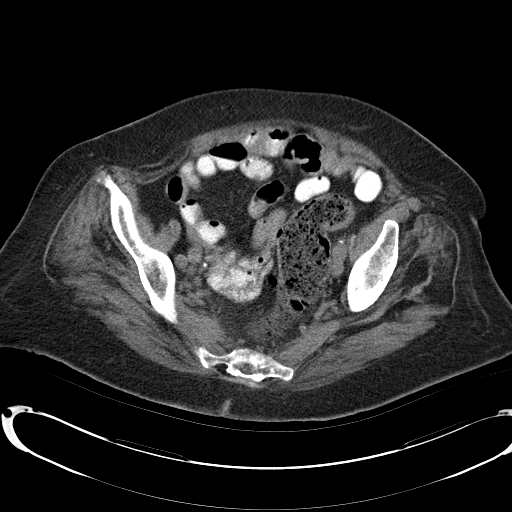
[im 37/88  soft-tissue]
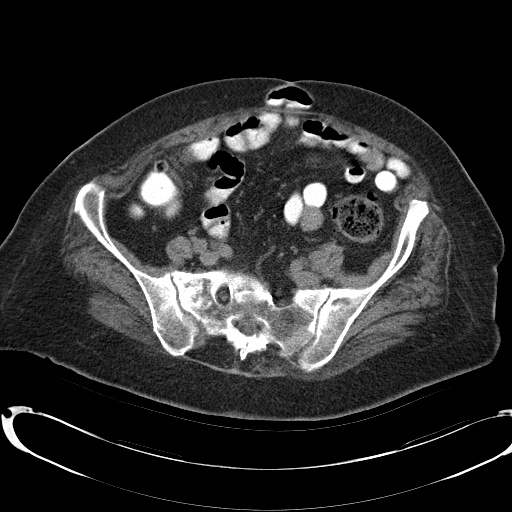
[im 40/88  soft-tissue]
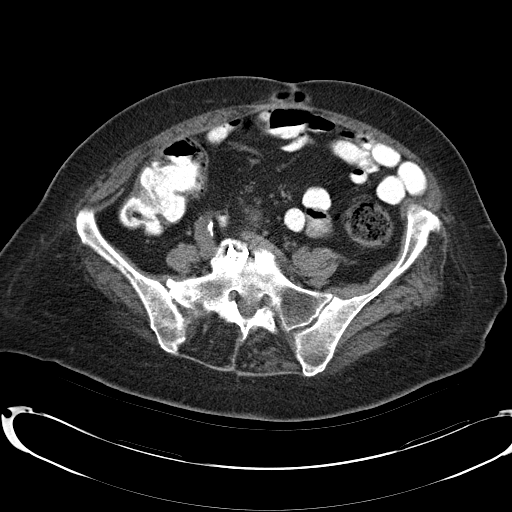
[im 48/88  soft-tissue]
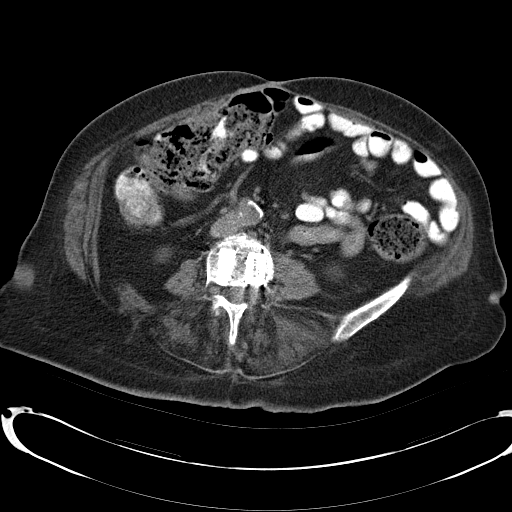
[im 51/88  soft-tissue]
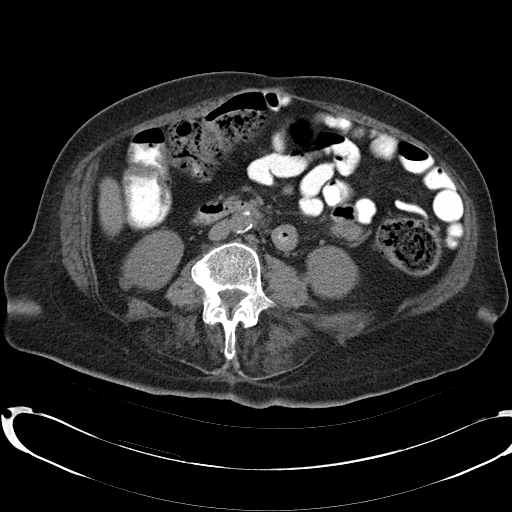
[im 51/88  bone]
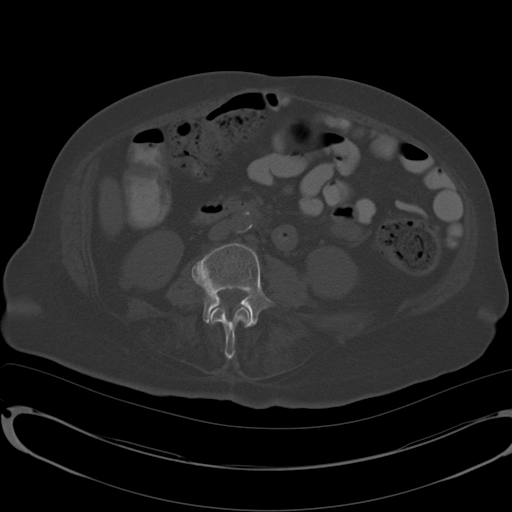
[im 59/88  soft-tissue]
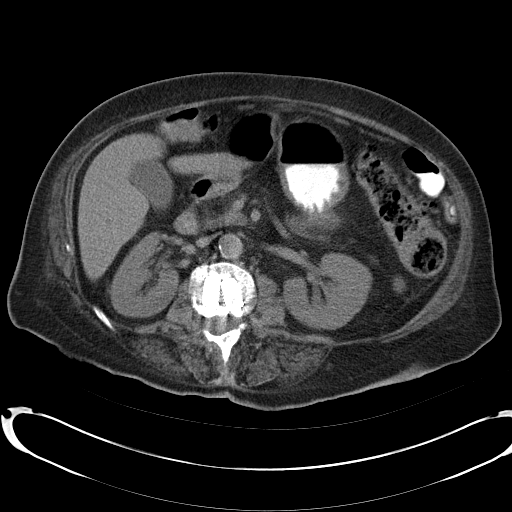
[im 66/88  soft-tissue]
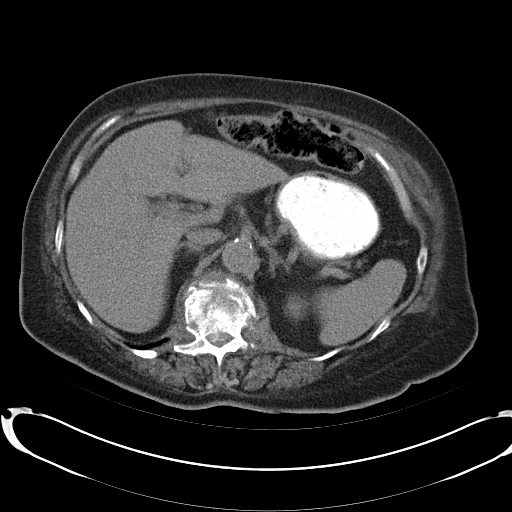
[im 69/88  soft-tissue]
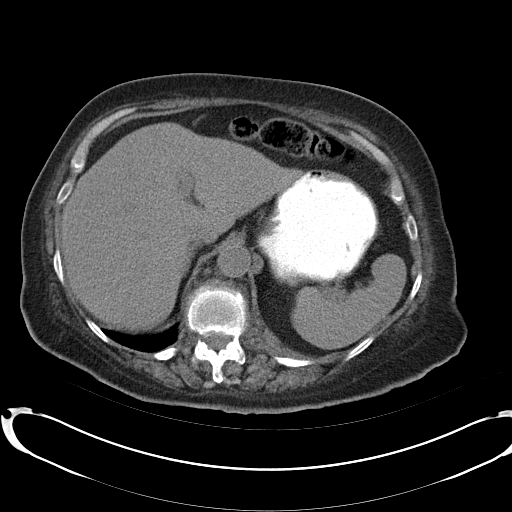
[im 77/88  soft-tissue]
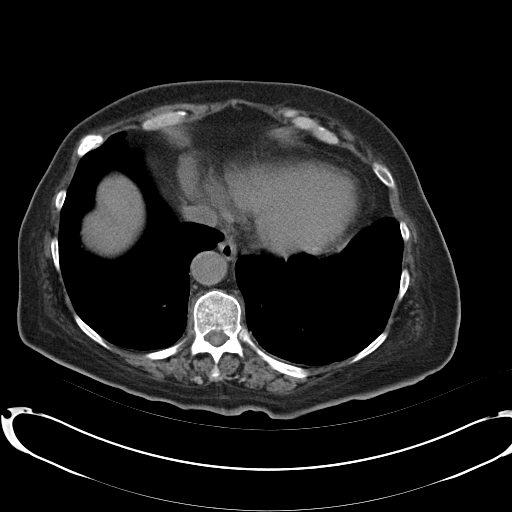
[im 84/88  soft-tissue]
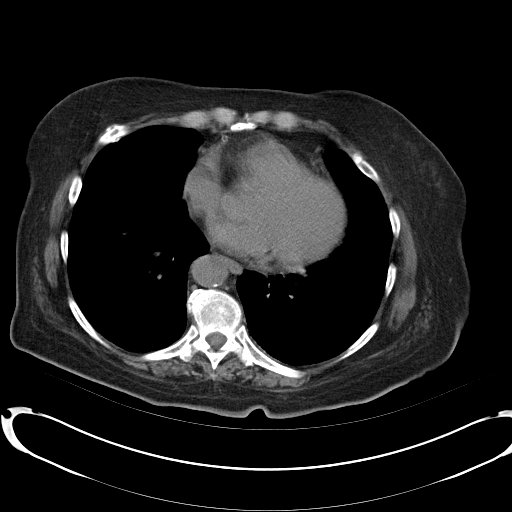

[Series 3: mpr cor (id) · coronal · 0.86mm/px · 3 of 102 slices shown]
[im 34/102  soft-tissue]
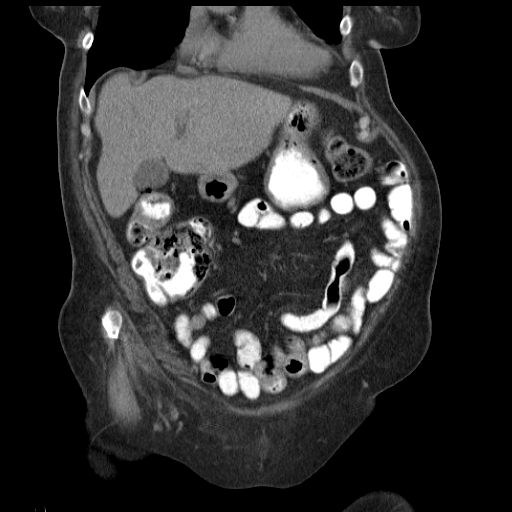
[im 45/102  soft-tissue]
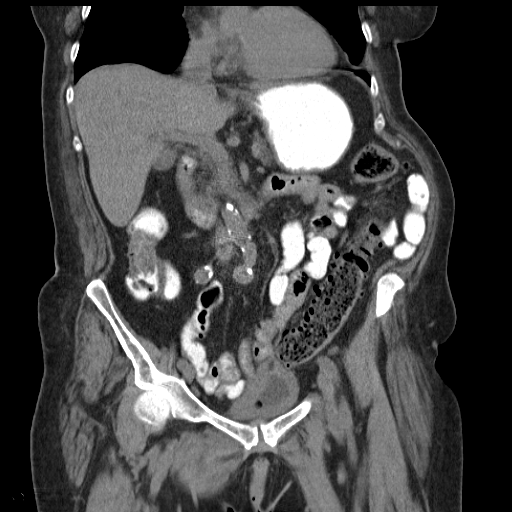
[im 57/102  soft-tissue]
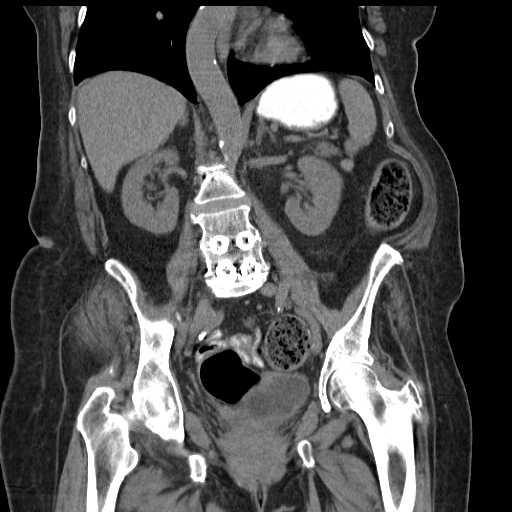

[17 of 46 positions shown; findings below may reference images not displayed]

FINDINGS: LOWER CHEST: Coronary atherosclerosis. No acute findings in the
lower lungs.

ABDOMEN/PELVIS:

Liver: No focal abnormality.

Biliary: No evidence of biliary obstruction or stone.

Pancreas: Unremarkable.

Spleen: Unremarkable.

Adrenals: Unremarkable.

Kidneys and ureters: No hydronephrosis or stone.

Bladder: There is bladder wall thickening but the bladder is partly
decompressed with a Foley catheter.

Reproductive: Hysterectomy.  Negative adnexa.

Bowel: Formed stool distends most colonic segments. No pericecal
inflammation. Colonic diverticulosis. There is a small midline
abdominal wall hernia, infraumbilical, which chronically contains
nonobstructed small bowel.

Retroperitoneum: No mass or adenopathy.

Peritoneum: No ascites or pneumoperitoneum.

Vascular: No acute abnormality.

OSSEOUS: Sclerosis in the right sacral ala which is likely from
remote insufficiency fracture.

Comminuted L1 body fracture with marked retropulsion causing severe
spinal canal stenosis (AP diameter 5 mm). There is bilateral L1
transverse process and spinous process fractures which are
nondisplaced. Edema and hemorrhage surrounds the compressed
vertebra. Anterior compression deformity has increased from
previous, 75% or greater.

Status post L2-3, L4-5, and L5-S1 discectomy. No new fracture
identified.
IMPRESSION: 1. No acute intra-abdominal findings.
2. Known L1 comminuted body fracture with severe spinal canal
stenosis (5 mm AP canal diameter). Vertebral body compression has
worsened since previous.
3. Possible constipation.
4. Infra umbilical hernia containing nonobstructed small bowel.

## 2015-10-25 ENCOUNTER — Other Ambulatory Visit: Payer: Self-pay | Admitting: Family Medicine

## 2015-10-28 ENCOUNTER — Encounter: Payer: Self-pay | Admitting: *Deleted

## 2015-11-05 ENCOUNTER — Encounter: Payer: Self-pay | Admitting: Diagnostic Neuroimaging

## 2015-11-05 ENCOUNTER — Ambulatory Visit (INDEPENDENT_AMBULATORY_CARE_PROVIDER_SITE_OTHER): Payer: Medicare Other | Admitting: Diagnostic Neuroimaging

## 2015-11-05 ENCOUNTER — Ambulatory Visit: Payer: Medicare Other | Admitting: Diagnostic Neuroimaging

## 2015-11-05 VITALS — BP 99/64 | HR 44 | Ht 60.0 in | Wt 158.0 lb

## 2015-11-05 DIAGNOSIS — G894 Chronic pain syndrome: Secondary | ICD-10-CM

## 2015-11-05 DIAGNOSIS — F0391 Unspecified dementia with behavioral disturbance: Secondary | ICD-10-CM

## 2015-11-05 DIAGNOSIS — F329 Major depressive disorder, single episode, unspecified: Secondary | ICD-10-CM | POA: Diagnosis not present

## 2015-11-05 DIAGNOSIS — F03B18 Unspecified dementia, moderate, with other behavioral disturbance: Secondary | ICD-10-CM

## 2015-11-05 DIAGNOSIS — F32A Depression, unspecified: Secondary | ICD-10-CM
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
## 2015-11-16 ENCOUNTER — Other Ambulatory Visit: Payer: Self-pay | Admitting: Family Medicine

## 2015-12-09 ENCOUNTER — Encounter: Payer: Self-pay | Admitting: Family Medicine

## 2015-12-09 ENCOUNTER — Ambulatory Visit (INDEPENDENT_AMBULATORY_CARE_PROVIDER_SITE_OTHER): Payer: Medicare Other | Admitting: Family Medicine

## 2015-12-09 VITALS — BP 122/78 | Ht 61.0 in | Wt 163.4 lb

## 2015-12-09 DIAGNOSIS — J441 Chronic obstructive pulmonary disease with (acute) exacerbation: Secondary | ICD-10-CM | POA: Diagnosis not present

## 2015-12-09 DIAGNOSIS — R350 Frequency of micturition: Secondary | ICD-10-CM

## 2015-12-09 DIAGNOSIS — R2681 Unsteadiness on feet: Secondary | ICD-10-CM

## 2015-12-09 DIAGNOSIS — R3589 Other polyuria: Secondary | ICD-10-CM

## 2015-12-09 DIAGNOSIS — G894 Chronic pain syndrome: Secondary | ICD-10-CM

## 2015-12-09 DIAGNOSIS — I10 Essential (primary) hypertension: Secondary | ICD-10-CM | POA: Diagnosis not present

## 2015-12-09 DIAGNOSIS — F03918 Unspecified dementia, unspecified severity, with other behavioral disturbance: Secondary | ICD-10-CM

## 2015-12-09 DIAGNOSIS — F0391 Unspecified dementia with behavioral disturbance: Secondary | ICD-10-CM | POA: Diagnosis not present

## 2015-12-09 DIAGNOSIS — R358 Other polyuria: Secondary | ICD-10-CM

## 2015-12-09 LAB — POCT URINALYSIS DIPSTICK
Leukocytes, UA: NEGATIVE
Spec Grav, UA: 1.02
pH, UA: 5

## 2015-12-09 MED ORDER — HYDROCODONE-ACETAMINOPHEN 5-325 MG PO TABS: ORAL_TABLET | ORAL | Status: DC

## 2015-12-09 MED ORDER — ALBUTEROL SULFATE HFA 108 (90 BASE) MCG/ACT IN AERS: INHALATION_SPRAY | RESPIRATORY_TRACT | Status: DC

## 2015-12-09 NOTE — Progress Notes (Signed)
Subjective:    Patient ID: Nichole BankerVivian L Querry, female    DOB: 06/15/1946, 70 y.o.   MRN: [id number]  HPI This patient was seen today for chronic pain  The medication list was reviewed and updated.   -Compliance with medication: Yes  - Number patient states they take daily: Takes 1/2 to 1 tablet  -when was the last dose patient took? Today 0830.   The patient was advised the importance of maintaining medication and not using illegal substances with these.  Refills needed: Yes  The patient was educated that we can provide 3 monthly scripts for their medication, it is their responsibility to follow the instructions.  Side effects or complications from medications: None  Patient is aware that pain medications are meant to minimize the severity of the pain to allow their pain levels to improve to allow for better function. They are aware of that pain medications cannot totally remove their pain.  Due for UDT ( at least once per year) : N/A  States no other concerns this visit.     Results for orders placed or performed in visit on 12/09/15  POCT urinalysis dipstick  Result Value Ref Range   Color, UA Yellow    Clarity, UA Clear    Glucose, UA     Bilirubin, UA     Ketones, UA     Spec Grav, UA 1.020    Blood, UA     pH, UA 5.0    Protein, UA     Urobilinogen, UA     Nitrite, UA     Leukocytes, UA Negative Negative   long discussion held regarding patient's dementia. She went and saw the neurologist 2. With her substantial neuromuscular issues along with visual hallucinations, I felt she may have Willie body dementia. Neurologist did not declare this.   Discussion held with family therapy is pretty much the same between the 2, and the final result is identical , so exact diagnosis not crucial at this time.   Husband reports frustration regarding wife's continuing deterioration. Along with her difficulty with handling financial matters. Along with her parent difficulty  handling financial matters before she became obviously demented. Next  Patient reports pain medicine helps and she definitely needs it. Husband reports some days she takes the full dose [place] is partial.   Patient's had some wheezing shortness of breath cough. She has run out of her inhaler.   Patient remained so reports urinating difficulties trouble with hesitancy increase frequency. Had a urinary tract infection last year   Review of Systems No headache, no major weight loss or weight gain, no chest pain no back pain abdominal pain no change in bowel habits complete ROS otherwise negative     Objective:   Physical Exam   alert oriented 1 no acute distress HEENT normal lungs clear currently heart regular in rhythm back some pain percussion plus minus straight leg raise on right   Urinalysis unremarkable      Assessment & Plan:   impression #1 worsening dementia. Complete neurology notes and workup reviewed in the presence of the family #2 chronic back pain ongoing severe intermittently with need for narcotics #3 intermittent reactive airways with history of asthma/COPD #4 family burnout with husband reporting his own challenges caring for life. Discussed potential daytime respite care. Patient to explore with social services through husband available resources #5 urinary issues discussed potential initiating medicine for incontinence #6 hypertension good control on current regimen plan as noted above  40 minutes spent most in discussion

## 2015-12-10 ENCOUNTER — Other Ambulatory Visit: Payer: Self-pay

## 2015-12-10 MED ORDER — OXYBUTYNIN CHLORIDE ER 5 MG PO TB24: ORAL_TABLET | ORAL | Status: DC

## 2015-12-12 ENCOUNTER — Other Ambulatory Visit: Payer: Self-pay | Admitting: Family Medicine

## 2015-12-23 ENCOUNTER — Other Ambulatory Visit: Payer: Self-pay | Admitting: Family Medicine

## 2016-01-06 ENCOUNTER — Other Ambulatory Visit: Payer: Self-pay | Admitting: Family Medicine

## 2016-01-15 ENCOUNTER — Other Ambulatory Visit: Payer: Self-pay | Admitting: Family Medicine

## 2016-01-15 NOTE — Telephone Encounter (Signed)
Ok plus 3 ref 

## 2016-01-27 ENCOUNTER — Other Ambulatory Visit: Payer: Self-pay | Admitting: Family Medicine

## 2016-03-09 ENCOUNTER — Ambulatory Visit (INDEPENDENT_AMBULATORY_CARE_PROVIDER_SITE_OTHER): Payer: Medicare Other | Admitting: Family Medicine

## 2016-03-09 ENCOUNTER — Encounter: Payer: Self-pay | Admitting: Family Medicine

## 2016-03-09 VITALS — BP 134/84 | Ht 61.0 in | Wt 173.4 lb

## 2016-03-09 DIAGNOSIS — G894 Chronic pain syndrome: Secondary | ICD-10-CM

## 2016-03-09 DIAGNOSIS — F0391 Unspecified dementia with behavioral disturbance: Secondary | ICD-10-CM | POA: Diagnosis not present

## 2016-03-09 DIAGNOSIS — I10 Essential (primary) hypertension: Secondary | ICD-10-CM | POA: Diagnosis not present

## 2016-03-09 DIAGNOSIS — F03918 Unspecified dementia, unspecified severity, with other behavioral disturbance: Secondary | ICD-10-CM

## 2016-03-09 DIAGNOSIS — J441 Chronic obstructive pulmonary disease with (acute) exacerbation: Secondary | ICD-10-CM | POA: Diagnosis not present

## 2016-03-09 MED ORDER — HYDROCODONE-ACETAMINOPHEN 5-325 MG PO TABS: ORAL_TABLET | ORAL | 0 refills | Status: DC

## 2016-03-09 MED ORDER — CEPHALEXIN 500 MG PO CAPS: 500.0000 mg | ORAL_CAPSULE | Freq: Three times a day (TID) | ORAL | 0 refills | Status: DC

## 2016-03-09 MED ORDER — PREDNISONE 20 MG PO TABS: ORAL_TABLET | ORAL | 0 refills | Status: DC

## 2016-03-09 NOTE — Progress Notes (Signed)
   Subjective:    Patient ID: Nichole [name], female    DOB: 08/15/1946, 70 y.o.   MRN: [id number]  HPI This patient was seen today for chronic pain  The medication list was reviewed and updated.   -Compliance with medication: yes  - Number patient states they take daily: one a day at most  -when was the last dose patient took? Last night last dose  The patient was advised the importance of maintaining medication and not using illegal substances with these.  Refills needed: yes  The patient was educated that we can provide 3 monthly scripts for their medication, it is their responsibility to follow the instructions.  Side effects or complications from medications: none  Patient is aware that pain medications are meant to minimize the severity of the pain to allow their pain levels to improve to allow for better function. They are aware of that pain medications cannot totally remove their pain.  Due for UDT ( at least once per year) :    Patient with c/o cough, wheezing and congestion for over a week. Wheeziness flared up. Needing inhaler more often. More shortness of breath. Next  Compliant with antidepressant medication. For some reason on 2 different antidepressants at this time. Not clear as to why.  Family reports dementia overall is ongoing and challenging but seems to be fairly stable at this time. Caretaker is managing okay at this time without much help from family members   Review of Systems No headache, no major weight loss or weight gain, no chest pain no back pain abdominal pain no change in bowel habits complete ROS otherwise negative     Objective:   Physical Exam  Alert vital stable no acute distress HEENT mild nasal congestion lungs bilateral wheezes no tachypnea heart regular rhythm ankles without edema no focal neurological deficits      Assessment & Plan:  Impression 1 exacerbation of COPD discussed #2 dementia fairly stable but ongoing substantial  challenge discussed #3 chronic pain with back in need of pain medicine for depression clinically stable but needs to be off on antidepressants discussed plan recommend medications changes noted. Round of steroids. Antibiotics. Maintain pain medicine. Diet exercise discussed WSL

## 2016-03-30 ENCOUNTER — Other Ambulatory Visit: Payer: Self-pay | Admitting: Family Medicine

## 2016-04-14 ENCOUNTER — Other Ambulatory Visit: Payer: Self-pay | Admitting: Family Medicine

## 2016-04-15 ENCOUNTER — Other Ambulatory Visit: Payer: Self-pay | Admitting: Family Medicine

## 2016-05-06 ENCOUNTER — Other Ambulatory Visit: Payer: Self-pay | Admitting: Family Medicine

## 2016-05-19 ENCOUNTER — Other Ambulatory Visit: Payer: Self-pay | Admitting: Family Medicine

## 2016-06-01 ENCOUNTER — Ambulatory Visit: Payer: Medicare Other | Admitting: Family Medicine

## 2016-06-08 ENCOUNTER — Other Ambulatory Visit: Payer: Self-pay | Admitting: Family Medicine

## 2016-06-09 NOTE — Telephone Encounter (Signed)
Ok six mo worth 

## 2016-06-15 ENCOUNTER — Other Ambulatory Visit: Payer: Self-pay | Admitting: Family Medicine

## 2016-06-17 ENCOUNTER — Telehealth: Payer: Self-pay | Admitting: Family Medicine

## 2016-06-17 ENCOUNTER — Other Ambulatory Visit: Payer: Self-pay | Admitting: *Deleted

## 2016-06-17 MED ORDER — ALBUTEROL SULFATE HFA 108 (90 BASE) MCG/ACT IN AERS: INHALATION_SPRAY | RESPIRATORY_TRACT | 5 refills | Status: DC

## 2016-06-17 NOTE — Telephone Encounter (Signed)
done

## 2016-06-17 NOTE — Telephone Encounter (Signed)
Refills sent to pharm. Pt states inhaler usually helps. Advised to go to ed if having trouble breathing.

## 2016-06-17 NOTE — Telephone Encounter (Signed)
Ok plus six ref 

## 2016-06-17 NOTE — Telephone Encounter (Signed)
Pt needs refill on her albuterol (VENTOLIN HFA) 108 (90 Base) MCG/ACT inhaler Pt states she ususally gets 2 at a a time Pt is completely out Needs today-having trouble bleeding   Please call pt when done   [facility]

## 2016-06-23 ENCOUNTER — Other Ambulatory Visit: Payer: Self-pay | Admitting: Family Medicine

## 2016-07-01 ENCOUNTER — Encounter: Payer: Self-pay | Admitting: Family Medicine

## 2016-07-02 ENCOUNTER — Telehealth: Payer: Self-pay | Admitting: Family Medicine

## 2016-07-02 MED ORDER — ALBUTEROL SULFATE HFA 108 (90 BASE) MCG/ACT IN AERS: INHALATION_SPRAY | RESPIRATORY_TRACT | 5 refills | Status: DC

## 2016-07-02 NOTE — Telephone Encounter (Signed)
Patient is requesting refills for her nebulizer.  Nichole [name]

## 2016-07-02 NOTE — Telephone Encounter (Signed)
Patient stated that she just needs a refill on her inhaler. Patient stated that she does not use a nebulizer just an inhaler. Advised patient per Dr.Scott Luking if she is having significant breathing issues you should schedule an office visit. Patient verbalized understanding.

## 2016-07-02 NOTE — Telephone Encounter (Signed)
Please touch base with patient. Please confirm-I imagine she is asking for albuterol nebulizer solution. Find out how she is use this medication in the past?. Am sure we can call in a refill. If patient is having significant breathing issues she should schedule an office visit as well. Please discuss this with me between patient so we can handle this during today's office hours

## 2016-07-02 NOTE — Telephone Encounter (Signed)
Reviewed patient's medication list and did not see any Nebulizer solutions. Please Advise?

## 2016-07-16 ENCOUNTER — Ambulatory Visit: Payer: Medicare Other | Admitting: Family Medicine

## 2016-07-20 ENCOUNTER — Other Ambulatory Visit: Payer: Self-pay | Admitting: Family Medicine

## 2016-07-20 NOTE — Telephone Encounter (Signed)
May had this +3 additional refills needs office visit

## 2016-07-27 ENCOUNTER — Other Ambulatory Visit: Payer: Self-pay | Admitting: Family Medicine

## 2016-08-07 ENCOUNTER — Ambulatory Visit (INDEPENDENT_AMBULATORY_CARE_PROVIDER_SITE_OTHER): Payer: Medicare Other | Admitting: Family Medicine

## 2016-08-07 ENCOUNTER — Encounter: Payer: Self-pay | Admitting: Family Medicine

## 2016-08-07 VITALS — BP 142/80 | Ht 61.0 in | Wt 180.0 lb

## 2016-08-07 DIAGNOSIS — G3183 Dementia with Lewy bodies: Secondary | ICD-10-CM

## 2016-08-07 DIAGNOSIS — F0281 Dementia in other diseases classified elsewhere with behavioral disturbance: Secondary | ICD-10-CM

## 2016-08-07 DIAGNOSIS — G894 Chronic pain syndrome: Secondary | ICD-10-CM

## 2016-08-07 DIAGNOSIS — M25572 Pain in left ankle and joints of left foot: Secondary | ICD-10-CM

## 2016-08-07 DIAGNOSIS — I10 Essential (primary) hypertension: Secondary | ICD-10-CM | POA: Diagnosis not present

## 2016-08-07 DIAGNOSIS — Z23 Encounter for immunization: Secondary | ICD-10-CM

## 2016-08-07 DIAGNOSIS — G8929 Other chronic pain: Secondary | ICD-10-CM | POA: Diagnosis not present

## 2016-08-07 MED ORDER — HYDROCODONE-ACETAMINOPHEN 5-325 MG PO TABS
ORAL_TABLET | ORAL | 0 refills | Status: DC
Start: 2016-08-07 — End: 2016-08-31

## 2016-08-07 MED ORDER — HYDROCODONE-ACETAMINOPHEN 5-325 MG PO TABS: ORAL_TABLET | ORAL | 0 refills | Status: DC

## 2016-08-07 MED ORDER — HYDROCODONE-ACETAMINOPHEN 5-325 MG PO TABS
ORAL_TABLET | ORAL | 0 refills | Status: DC
Start: 2016-08-07 — End: 2016-08-07

## 2016-08-07 NOTE — Progress Notes (Signed)
   Subjective:    Patient ID: Nichole [name], female    DOB: [birth year], 70 y.o.   MRN: [id number]  HPI This patient was seen today for chronic pain. Takes for back pain and left side pain  The medication list was reviewed and updated.   -Compliance with medication:   - Number patient states they take daily: one to two a day  -when was the last dose patient took? today  The patient was advised the importance of maintaining medication and not using illegal substances with these.  Refills needed: yes  The patient was educated that we can provide 3 monthly scripts for their medication, it is their responsibility to follow the instructions.  Side effects or complications from medications: none  Patient is aware that pain medications are meant to minimize the severity of the pain to allow their pain levels to improve to allow for better function. They are aware of that pain medications cannot totally remove their pain.  Due for UDT ( at least once per year) :   Having leg pain and swelling. Started 2.5 years ago.  worsein regionof prior ale fracture and surgery   Flu vaccine today.  Blood pressure medicine and blood pressure levels reviewed today with patient. Compliant with blood pressure medicine. States does not miss a dose. No obvious side effects. Blood pressure generally good when checked elsewhere. Watching salt intake.   patient's family noes dementia sta. Has not improed. Ha ot [employer]. wih history of ambulatory difficulties alng withhallucinatory eement wonder if thsrerent  by disease. Family frustrated with neurologist feel like they did not t a ot frmtem. Review of Systems No headache, no major weight loss or weight gain, no chest pain no back pain abdominal pain no change in bowel habits complete ROS otherwise negative     Objective:   Physical Exam Alert vitals stable, NAD. Blood pressure good on repeat. HEENT normal. Lungs clear. Heart regular rate and rhythm.  Knees  ilateralceiatis       Assessment & Plan:   impression 1 dementa clinically stable#2chroic pain ongoing ned for medications #3 swelling and g likely pos inflammatory for minimal residal chalenges  #4 hypertensionplan medicatin prescried symptom care discused pain medicines refilled. No further orkupwelling. Maintain same bood presure dose rationadiscuss

## 2016-08-27 ENCOUNTER — Encounter (HOSPITAL_COMMUNITY): Payer: Self-pay | Admitting: *Deleted

## 2016-08-27 ENCOUNTER — Emergency Department (HOSPITAL_COMMUNITY): Payer: PPO

## 2016-08-27 ENCOUNTER — Inpatient Hospital Stay (HOSPITAL_COMMUNITY)
Admission: EM | Admit: 2016-08-27 | Discharge: 2016-09-01 | DRG: 084 | Disposition: A | Discharge status: Skilled Nursing Facility | Payer: PPO | Attending: Internal Medicine | Admitting: Internal Medicine

## 2016-08-27 DIAGNOSIS — Z8673 Personal history of transient ischemic attack (TIA), and cerebral infarction without residual deficits: Secondary | ICD-10-CM | POA: Diagnosis not present

## 2016-08-27 DIAGNOSIS — E876 Hypokalemia: Secondary | ICD-10-CM | POA: Diagnosis present

## 2016-08-27 DIAGNOSIS — R930 Abnormal findings on diagnostic imaging of skull and head, not elsewhere classified: Secondary | ICD-10-CM | POA: Diagnosis not present

## 2016-08-27 DIAGNOSIS — Z87891 Personal history of nicotine dependence: Secondary | ICD-10-CM

## 2016-08-27 DIAGNOSIS — Z79899 Other long term (current) drug therapy: Secondary | ICD-10-CM | POA: Diagnosis not present

## 2016-08-27 DIAGNOSIS — Z888 Allergy status to other drugs, medicaments and biological substances status: Secondary | ICD-10-CM

## 2016-08-27 DIAGNOSIS — R296 Repeated falls: Secondary | ICD-10-CM | POA: Diagnosis not present

## 2016-08-27 DIAGNOSIS — F0390 Unspecified dementia without behavioral disturbance: Secondary | ICD-10-CM | POA: Diagnosis present

## 2016-08-27 DIAGNOSIS — Z86010 Personal history of colonic polyps: Secondary | ICD-10-CM | POA: Diagnosis not present

## 2016-08-27 DIAGNOSIS — S0990XA Unspecified injury of head, initial encounter: Secondary | ICD-10-CM | POA: Diagnosis not present

## 2016-08-27 DIAGNOSIS — S3992XA Unspecified injury of lower back, initial encounter: Secondary | ICD-10-CM | POA: Diagnosis not present

## 2016-08-27 DIAGNOSIS — W19XXXA Unspecified fall, initial encounter: Secondary | ICD-10-CM | POA: Diagnosis present

## 2016-08-27 DIAGNOSIS — Z6832 Body mass index (BMI) 32.0-32.9, adult: Secondary | ICD-10-CM

## 2016-08-27 DIAGNOSIS — S065X9A Traumatic subdural hemorrhage with loss of consciousness of unspecified duration, initial encounter: Principal | ICD-10-CM | POA: Diagnosis present

## 2016-08-27 DIAGNOSIS — Z880 Allergy status to penicillin: Secondary | ICD-10-CM

## 2016-08-27 DIAGNOSIS — Z91013 Allergy to seafood: Secondary | ICD-10-CM | POA: Diagnosis not present

## 2016-08-27 DIAGNOSIS — Z9181 History of falling: Secondary | ICD-10-CM

## 2016-08-27 DIAGNOSIS — E663 Overweight: Secondary | ICD-10-CM | POA: Diagnosis not present

## 2016-08-27 DIAGNOSIS — R29898 Other symptoms and signs involving the musculoskeletal system: Secondary | ICD-10-CM | POA: Diagnosis not present

## 2016-08-27 DIAGNOSIS — Z91041 Radiographic dye allergy status: Secondary | ICD-10-CM

## 2016-08-27 DIAGNOSIS — I619 Nontraumatic intracerebral hemorrhage, unspecified: Secondary | ICD-10-CM

## 2016-08-27 DIAGNOSIS — I951 Orthostatic hypotension: Secondary | ICD-10-CM

## 2016-08-27 DIAGNOSIS — J449 Chronic obstructive pulmonary disease, unspecified: Secondary | ICD-10-CM | POA: Diagnosis not present

## 2016-08-27 DIAGNOSIS — R609 Edema, unspecified: Secondary | ICD-10-CM | POA: Diagnosis present

## 2016-08-27 DIAGNOSIS — I10 Essential (primary) hypertension: Secondary | ICD-10-CM | POA: Diagnosis present

## 2016-08-27 DIAGNOSIS — M79604 Pain in right leg: Secondary | ICD-10-CM | POA: Diagnosis not present

## 2016-08-27 DIAGNOSIS — E785 Hyperlipidemia, unspecified: Secondary | ICD-10-CM | POA: Diagnosis present

## 2016-08-27 DIAGNOSIS — M545 Low back pain: Secondary | ICD-10-CM | POA: Diagnosis not present

## 2016-08-27 DIAGNOSIS — R2689 Other abnormalities of gait and mobility: Secondary | ICD-10-CM

## 2016-08-27 DIAGNOSIS — G4730 Sleep apnea, unspecified: Secondary | ICD-10-CM | POA: Diagnosis not present

## 2016-08-27 DIAGNOSIS — R531 Weakness: Secondary | ICD-10-CM | POA: Diagnosis not present

## 2016-08-27 DIAGNOSIS — S199XXA Unspecified injury of neck, initial encounter: Secondary | ICD-10-CM | POA: Diagnosis not present

## 2016-08-27 DIAGNOSIS — R404 Transient alteration of awareness: Secondary | ICD-10-CM | POA: Diagnosis not present

## 2016-08-27 DIAGNOSIS — S3993XA Unspecified injury of pelvis, initial encounter: Secondary | ICD-10-CM | POA: Diagnosis not present

## 2016-08-27 LAB — BASIC METABOLIC PANEL
Anion gap: 11 (ref 5–15)
BUN: 22 mg/dL — ABNORMAL HIGH (ref 6–20)
CO2: 27 mmol/L (ref 22–32)
Calcium: 9.3 mg/dL (ref 8.9–10.3)
Chloride: 97 mmol/L — ABNORMAL LOW (ref 101–111)
Creatinine, Ser: 0.76 mg/dL (ref 0.44–1.00)
GFR calc Af Amer: >60 mL/min (ref >60)
GFR calc non Af Amer: >60 mL/min (ref >60)
Glucose, Bld: 90 mg/dL (ref 65–99)
Potassium: 2.5 mmol/L — CL (ref 3.5–5.1)
Sodium: 135 mmol/L (ref 135–145)

## 2016-08-27 LAB — CBC WITH DIFFERENTIAL/PLATELET
Basophils Absolute: 0 10*3/uL (ref 0.0–0.1)
Basophils Relative: 0 %
Eosinophils Absolute: 0.1 10*3/uL (ref 0.0–0.7)
Eosinophils Relative: 1 %
HCT: 43.7 % (ref 36.0–46.0)
Hemoglobin: 14.8 g/dL (ref 12.0–15.0)
Lymphocytes Relative: 19 %
Lymphs Abs: 1.8 10*3/uL (ref 0.7–4.0)
MCH: 29 pg (ref 26.0–34.0)
MCHC: 33.9 g/dL (ref 30.0–36.0)
MCV: 85.5 fL (ref 78.0–100.0)
Monocytes Absolute: 0.8 10*3/uL (ref 0.1–1.0)
Monocytes Relative: 9 %
Neutro Abs: 6.7 10*3/uL (ref 1.7–7.7)
Neutrophils Relative %: 71 %
Platelets: 360 10*3/uL (ref 150–400)
RBC: 5.11 MIL/uL (ref 3.87–5.11)
RDW: 12.7 % (ref 11.5–15.5)
WBC: 9.3 10*3/uL (ref 4.0–10.5)

## 2016-08-27 LAB — URINALYSIS, ROUTINE W REFLEX MICROSCOPIC
Bilirubin Urine: NEGATIVE
Glucose, UA: NEGATIVE mg/dL
Hgb urine dipstick: NEGATIVE
Ketones, ur: 20 mg/dL — AB
Nitrite: NEGATIVE
Protein, ur: NEGATIVE mg/dL
Specific Gravity, Urine: 1.023 (ref 1.005–1.030)
pH: 5 (ref 5.0–8.0)

## 2016-08-27 MED ORDER — POTASSIUM CHLORIDE 10 MEQ/100ML IV SOLN
10.0000 meq | INTRAVENOUS | Status: AC
  Administered 2016-08-27 – 2016-08-28 (×2): 10 meq via INTRAVENOUS
  Filled 2016-08-27 (×2): qty 100

## 2016-08-27 MED ORDER — FENTANYL CITRATE (PF) 100 MCG/2ML IJ SOLN
50.0000 ug | Freq: Once | INTRAMUSCULAR | Status: AC
  Administered 2016-08-27: 50 ug via INTRAVENOUS
  Filled 2016-08-27: qty 2
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
## 2016-08-27 NOTE — ED Triage Notes (Signed)
Pt comes in by EMS from home with right leg pain from hip down to foot that started 3 days ago. Denies any injury. Pt is alert, hx of demenita. Husband is on the way to meet her up here.

## 2016-08-27 NOTE — ED Notes (Signed)
CRITICAL VALUE ALERT  Critical value received:  Potassium 2.5 mg/dl  Date of notification:  08/27/16  Time of notification:  2249 hrs  Critical value read back:Yes.    Nurse who received alert:  Y. Delvon Chipps, RN  Responding MD:  Dr. Lynelle DoctorKnapp  Time MD responded:  2250 hrs

## 2016-08-28 ENCOUNTER — Observation Stay (HOSPITAL_COMMUNITY): Payer: PPO

## 2016-08-28 ENCOUNTER — Encounter (HOSPITAL_COMMUNITY): Payer: Self-pay | Admitting: *Deleted

## 2016-08-28 DIAGNOSIS — E785 Hyperlipidemia, unspecified: Secondary | ICD-10-CM | POA: Diagnosis not present

## 2016-08-28 DIAGNOSIS — I619 Nontraumatic intracerebral hemorrhage, unspecified: Secondary | ICD-10-CM | POA: Diagnosis not present

## 2016-08-28 DIAGNOSIS — R531 Weakness: Secondary | ICD-10-CM | POA: Diagnosis not present

## 2016-08-28 DIAGNOSIS — G300 Alzheimer's disease with early onset: Secondary | ICD-10-CM | POA: Diagnosis not present

## 2016-08-28 DIAGNOSIS — Z87891 Personal history of nicotine dependence: Secondary | ICD-10-CM | POA: Diagnosis not present

## 2016-08-28 DIAGNOSIS — R270 Ataxia, unspecified: Secondary | ICD-10-CM | POA: Diagnosis not present

## 2016-08-28 DIAGNOSIS — G894 Chronic pain syndrome: Secondary | ICD-10-CM | POA: Diagnosis not present

## 2016-08-28 DIAGNOSIS — I10 Essential (primary) hypertension: Secondary | ICD-10-CM | POA: Diagnosis not present

## 2016-08-28 DIAGNOSIS — R2689 Other abnormalities of gait and mobility: Secondary | ICD-10-CM | POA: Diagnosis not present

## 2016-08-28 DIAGNOSIS — R262 Difficulty in walking, not elsewhere classified: Secondary | ICD-10-CM | POA: Diagnosis not present

## 2016-08-28 DIAGNOSIS — J441 Chronic obstructive pulmonary disease with (acute) exacerbation: Secondary | ICD-10-CM | POA: Diagnosis not present

## 2016-08-28 DIAGNOSIS — Z880 Allergy status to penicillin: Secondary | ICD-10-CM | POA: Diagnosis not present

## 2016-08-28 DIAGNOSIS — E876 Hypokalemia: Secondary | ICD-10-CM | POA: Diagnosis not present

## 2016-08-28 DIAGNOSIS — S065X9A Traumatic subdural hemorrhage with loss of consciousness of unspecified duration, initial encounter: Secondary | ICD-10-CM | POA: Diagnosis present

## 2016-08-28 DIAGNOSIS — J449 Chronic obstructive pulmonary disease, unspecified: Secondary | ICD-10-CM | POA: Diagnosis not present

## 2016-08-28 DIAGNOSIS — Z91013 Allergy to seafood: Secondary | ICD-10-CM | POA: Diagnosis not present

## 2016-08-28 DIAGNOSIS — R29898 Other symptoms and signs involving the musculoskeletal system: Secondary | ICD-10-CM | POA: Diagnosis not present

## 2016-08-28 DIAGNOSIS — R609 Edema, unspecified: Secondary | ICD-10-CM | POA: Diagnosis not present

## 2016-08-28 DIAGNOSIS — E663 Overweight: Secondary | ICD-10-CM | POA: Diagnosis not present

## 2016-08-28 DIAGNOSIS — Z8673 Personal history of transient ischemic attack (TIA), and cerebral infarction without residual deficits: Secondary | ICD-10-CM | POA: Diagnosis not present

## 2016-08-28 DIAGNOSIS — Z9181 History of falling: Secondary | ICD-10-CM | POA: Diagnosis not present

## 2016-08-28 DIAGNOSIS — Z743 Need for continuous supervision: Secondary | ICD-10-CM | POA: Diagnosis not present

## 2016-08-28 DIAGNOSIS — M79604 Pain in right leg: Secondary | ICD-10-CM | POA: Diagnosis not present

## 2016-08-28 DIAGNOSIS — F330 Major depressive disorder, recurrent, mild: Secondary | ICD-10-CM | POA: Diagnosis not present

## 2016-08-28 DIAGNOSIS — M6281 Muscle weakness (generalized): Secondary | ICD-10-CM | POA: Diagnosis not present

## 2016-08-28 DIAGNOSIS — Z91041 Radiographic dye allergy status: Secondary | ICD-10-CM | POA: Diagnosis not present

## 2016-08-28 DIAGNOSIS — W19XXXA Unspecified fall, initial encounter: Secondary | ICD-10-CM | POA: Diagnosis not present

## 2016-08-28 DIAGNOSIS — I951 Orthostatic hypotension: Secondary | ICD-10-CM | POA: Diagnosis not present

## 2016-08-28 DIAGNOSIS — Z888 Allergy status to other drugs, medicaments and biological substances status: Secondary | ICD-10-CM | POA: Diagnosis not present

## 2016-08-28 DIAGNOSIS — R296 Repeated falls: Secondary | ICD-10-CM | POA: Diagnosis not present

## 2016-08-28 DIAGNOSIS — Z86010 Personal history of colonic polyps: Secondary | ICD-10-CM | POA: Diagnosis not present

## 2016-08-28 DIAGNOSIS — Z79899 Other long term (current) drug therapy: Secondary | ICD-10-CM | POA: Diagnosis not present

## 2016-08-28 DIAGNOSIS — R279 Unspecified lack of coordination: Secondary | ICD-10-CM | POA: Diagnosis not present

## 2016-08-28 DIAGNOSIS — R2681 Unsteadiness on feet: Secondary | ICD-10-CM | POA: Diagnosis not present

## 2016-08-28 DIAGNOSIS — Z6832 Body mass index (BMI) 32.0-32.9, adult: Secondary | ICD-10-CM | POA: Diagnosis not present

## 2016-08-28 DIAGNOSIS — F0390 Unspecified dementia without behavioral disturbance: Secondary | ICD-10-CM | POA: Diagnosis not present

## 2016-08-28 DIAGNOSIS — R569 Unspecified convulsions: Secondary | ICD-10-CM | POA: Diagnosis not present

## 2016-08-28 DIAGNOSIS — G4730 Sleep apnea, unspecified: Secondary | ICD-10-CM | POA: Diagnosis not present

## 2016-08-28 LAB — COMPREHENSIVE METABOLIC PANEL
ALT: 29 U/L (ref 14–54)
AST: 34 U/L (ref 15–41)
Albumin: 3.7 g/dL (ref 3.5–5.0)
Alkaline Phosphatase: 88 U/L (ref 38–126)
Anion gap: 8 (ref 5–15)
BUN: 19 mg/dL (ref 6–20)
CO2: 28 mmol/L (ref 22–32)
Calcium: 8.8 mg/dL — ABNORMAL LOW (ref 8.9–10.3)
Chloride: 100 mmol/L — ABNORMAL LOW (ref 101–111)
Creatinine, Ser: 0.54 mg/dL (ref 0.44–1.00)
GFR calc Af Amer: >60 mL/min (ref >60)
GFR calc non Af Amer: >60 mL/min (ref >60)
Glucose, Bld: 95 mg/dL (ref 65–99)
Potassium: 2.7 mmol/L — CL (ref 3.5–5.1)
Sodium: 136 mmol/L (ref 135–145)
Total Bilirubin: 1.4 mg/dL — ABNORMAL HIGH (ref 0.3–1.2)
Total Protein: 6.5 g/dL (ref 6.5–8.1)

## 2016-08-28 LAB — CBC
HCT: 40.1 % (ref 36.0–46.0)
Hemoglobin: 13.5 g/dL (ref 12.0–15.0)
MCH: 28.8 pg (ref 26.0–34.0)
MCHC: 33.7 g/dL (ref 30.0–36.0)
MCV: 85.7 fL (ref 78.0–100.0)
Platelets: 352 10*3/uL (ref 150–400)
RBC: 4.68 MIL/uL (ref 3.87–5.11)
RDW: 12.7 % (ref 11.5–15.5)
WBC: 7.7 10*3/uL (ref 4.0–10.5)

## 2016-08-28 LAB — TSH: TSH: 3.237 u[IU]/mL (ref 0.350–4.500)

## 2016-08-28 LAB — CORTISOL: Cortisol, Plasma: 12.6 ug/dL

## 2016-08-28 MED ORDER — DONEPEZIL HCL 5 MG PO TABS
10.0000 mg | ORAL_TABLET | Freq: Every morning | ORAL | Status: DC
  Administered 2016-08-28 – 2016-09-01 (×5): 10 mg via ORAL
  Filled 2016-08-28 (×5): qty 2

## 2016-08-28 MED ORDER — POTASSIUM CHLORIDE 10 MEQ/100ML IV SOLN
10.0000 meq | INTRAVENOUS | Status: AC
  Administered 2016-08-28 (×3): 10 meq via INTRAVENOUS
  Filled 2016-08-28: qty 100

## 2016-08-28 MED ORDER — OXYBUTYNIN CHLORIDE ER 5 MG PO TB24
5.0000 mg | ORAL_TABLET | Freq: Every day | ORAL | Status: DC
Start: 2016-08-28 — End: 2016-09-01
  Administered 2016-08-28 – 2016-08-31 (×4): 5 mg via ORAL
  Filled 2016-08-28 (×4): qty 1

## 2016-08-28 MED ORDER — CITALOPRAM HYDROBROMIDE 20 MG PO TABS
40.0000 mg | ORAL_TABLET | Freq: Every day | ORAL | Status: DC
Start: 2016-08-28 — End: 2016-09-01
  Administered 2016-08-28 – 2016-09-01 (×5): 40 mg via ORAL
  Filled 2016-08-28 (×5): qty 2

## 2016-08-28 MED ORDER — ALBUTEROL SULFATE (2.5 MG/3ML) 0.083% IN NEBU: 2.5000 mg | INHALATION_SOLUTION | Freq: Four times a day (QID) | RESPIRATORY_TRACT | Status: DC | PRN

## 2016-08-28 MED ORDER — SODIUM CHLORIDE 0.9 % IV SOLN
INTRAVENOUS | Status: DC
  Administered 2016-08-28 – 2016-08-31 (×7): via INTRAVENOUS

## 2016-08-28 MED ORDER — ALPRAZOLAM 0.5 MG PO TABS
0.5000 mg | ORAL_TABLET | Freq: Three times a day (TID) | ORAL | Status: DC | PRN
  Administered 2016-08-28 – 2016-09-01 (×4): 0.5 mg via ORAL
  Filled 2016-08-28 (×4): qty 1

## 2016-08-28 MED ORDER — HYDROCODONE-ACETAMINOPHEN 5-325 MG PO TABS
1.0000 | ORAL_TABLET | Freq: Four times a day (QID) | ORAL | Status: DC | PRN
  Administered 2016-08-28 – 2016-08-31 (×7): 1 via ORAL
  Filled 2016-08-28 (×7): qty 1

## 2016-08-28 MED ORDER — POTASSIUM CHLORIDE CRYS ER 20 MEQ PO TBCR
40.0000 meq | EXTENDED_RELEASE_TABLET | Freq: Four times a day (QID) | ORAL | Status: AC
  Administered 2016-08-28 (×2): 40 meq via ORAL
  Filled 2016-08-28 (×2): qty 2

## 2016-08-28 MED ORDER — MAGNESIUM SULFATE IN D5W 1-5 GM/100ML-% IV SOLN
1.0000 g | Freq: Once | INTRAVENOUS | Status: AC
  Administered 2016-08-28: 1 g via INTRAVENOUS
  Filled 2016-08-28: qty 100

## 2016-08-28 NOTE — H&P (Addendum)
TRH H&P    Patient Demographics:    Nichole [name], is a 71 y.o. female  MRN: [id number]  DOB - 07-25-1946  Admit Date - 08/27/2016  Referring MD/NP/PA: Dr. Lynelle Doctor  Outpatient Primary MD for the patient is Lubertha South, MD  Patient coming from: Home   Chief Complaint  Patient presents with  . Leg Pain      HPI:    Nichole [name]  is a 71 y.o. female,With history of COPD, hypertension, hyperlipidemia who was brought to the hospital with right leg pain. Patient says that she noticed weakness in right lower extremity about 2 months ago, and she has been dragging her right leg since then. Yesterday he says that she fell, hitting head. She also says that there was loss of consciousness for a short time. In the ED CT head and cervical spine were done which showed Left posterior parasagittal focal hyperdensity measuring 1.5 cm with surrounding mild edema. No midline shift. Findings could relate to a small intraparenchymal bleed although a hyperdense or hemorrhagic mass is also included in the differential. MRI with and without contrast is recommended for further evaluation.  She denies slurred speech, no visual disturbance except one episode yesterday which led to her fall, no chest pain or shortness of breath. No previous history of seizures.  Patient to be admitted for further evaluation including MRI.   Review of systems:    In addition to the HPI above,  No Fever-chills, No Headache, No changes with Vision or hearing, No problems swallowing food or Liquids, No Chest pain, Cough or Shortness of Breath, No Abdominal pain, No Nausea or Vomiting, bowel movements are regular, No Blood in stool or Urine, No dysuria, No new skin rashes or bruises, No new joints pains-aches,  No recent weight gain or loss, No polyuria, polydypsia or polyphagia, No significant Mental Stressors.  A full 10 point Review of  Systems was done, except as stated above, all other Review of Systems were negative.   With Past History of the following :    Past Medical History:  Diagnosis Date  . Anxiety   . Asthma   . Colon polyp   . COPD (chronic obstructive pulmonary disease) (HCC)   . Depression   . Diverticular disease   . Hyperlipidemia   . Hypertension   . IFG (impaired fasting glucose)   . Memory loss   . Mild sleep apnea       Past Surgical History:  Procedure Laterality Date  . ABDOMINAL HYSTERECTOMY    . ABDOMINAL SURGERY    . ANTERIOR LAT LUMBAR FUSION Left 06/22/2014   Procedure: Left Lumbar one Corpectomy with Fusion Thoracic twelve-Lumbar two,lateral plating,posterior percutaneous pedicle screws;  Surgeon: Temple Pacini, MD;  Location: [facility];  Service: Neurosurgery;  Laterality: Left;  . APPENDECTOMY    . BACK SURGERY    . CERVICAL FUSION  October 2007  . LUMBAR PERCUTANEOUS PEDICLE SCREW 1 LEVEL N/A 06/22/2014   Procedure: LUMBAR PERCUTANEOUS PEDICLE SCREW 1 LEVEL;  Surgeon: Temple Pacini, MD;  Location: [facility];  Service: Neurosurgery;  Laterality: N/A;  . NECK SURGERY    . ORIF TIBIA FRACTURE Left 06/04/2014   Procedure: OPEN REDUCTION INTERNAL FIXATION (ORIF) PILON FRACTURE AND OPEN REDUCTION INTERNAL FIXATION (ORIF) OF SYNDESMOTIC FRACTURE;  Surgeon: Eldred MangesMark C Yates, MD;  Location: [facility];  Service: Orthopedics;  Laterality: Left;  Big C-arm, Flat Jackson, Biomet Anterolateral plates, cancellous bone graft 20cc. Surgeon will call. Available after 5pm  . ORIF TIBIA FRACTURE Left 06/14/2014   DR Gerlene FeeKRITZER  . SIGMOIDECTOMY    . TONSILLECTOMY        Social History:      Social History  Substance Use Topics  . Smoking status: Former Smoker    Packs/day: 0.50    Years: 32.00    Types: Cigarettes    Start date: 04/11/1962    Quit date: 03/10/1994  . Smokeless tobacco: Never Used  . Alcohol use No       Family History :     Family History  Problem Relation Age of Onset    . Hypertension Father   . Diabetes Father   . Heart attack Father       Home Medications:   Prior to Admission medications   Medication Sig Start Date End Date Taking? Authorizing Provider  albuterol (VENTOLIN HFA) 108 (90 Base) MCG/ACT inhaler INHALE 2 PUFFS EVERY 6 HOURS AS NEEDED FOR WHEEZING 07/02/16  Yes Merlyn AlbertWilliam S Luking, MD  ALPRAZolam Prudy Feeler(XANAX) 1 MG tablet TAKE 1/2 TABLET 3 TIMES DAILY AS NEEDED. Patient taking differently: TAKE 1/2 TABLET 3 TIMES DAILY AS NEEDED FOR ANXIETY 01/15/16  Yes Merlyn AlbertWilliam S Luking, MD  citalopram (CELEXA) 40 MG tablet TAKE (1) TABLET BY MOUTH EACH MORNING. 06/10/16  Yes Merlyn AlbertWilliam S Luking, MD  diltiazem (CARDIZEM CD) 240 MG 24 hr capsule TAKE ONE CAPSULE BY MOUTH ONCE DAILY. 03/30/16  Yes Merlyn AlbertWilliam S Luking, MD  donepezil (ARICEPT) 10 MG tablet Take 10 mg by mouth every morning.   Yes Historical Provider, MD  doxazosin (CARDURA) 2 MG tablet TAKE ONE TABLET BY MOUTH ONCE DAILY. 07/28/16  Yes Merlyn AlbertWilliam S Luking, MD  hydrochlorothiazide (HYDRODIURIL) 25 MG tablet TAKE ONE TABLET BY MOUTH ONCE DAILY. 07/20/16  Yes Merlyn AlbertWilliam S Luking, MD  HYDROcodone-acetaminophen (NORCO/VICODIN) 5-325 MG tablet TAKE (1) TABLET BY MOUTH TWICE DAILY AS NEEDED FOR MODERATE PAIN. 08/07/16  Yes Merlyn AlbertWilliam S Luking, MD  meclizine (ANTIVERT) 25 MG tablet TAKE (1) TABLET BY MOUTH THREE TIMES DAILY AS NEEDED FOR DIZZINESS. 06/10/16  Yes Merlyn AlbertWilliam S Luking, MD  meloxicam (MOBIC) 7.5 MG tablet TAKE ONE TABLET BY MOUTH ONCE DAILY. 07/20/16  Yes Merlyn AlbertWilliam S Luking, MD  oxybutynin (DITROPAN-XL) 5 MG 24 hr tablet TAKE (1) TABLET BY MOUTH EACH MORNING. 07/20/16  Yes Merlyn AlbertWilliam S Luking, MD     Allergies:     Allergies  Allergen Reactions  . Shellfish Allergy Anaphylaxis  . Iodine Other (See Comments)    Unknown   . Iohexol      Code: SOB, Desc: PT STATES SHE WENT INTO ANAPHYLACTIC SHOCK 61YRS AGO AFTER CT WITH CONTRAST AT [place], Onset Date: [id number]   . Morphine And Related Other (See Comments)     Unknown   . Penicillins Rash     Physical Exam:   Vitals  Blood pressure 153/70, pulse (!) 54, temperature 98.3 F (36.8 C), temperature source Oral, resp. rate 20, height 5\' 1"  (1.549 m), weight 81.6 kg (180 lb), SpO2 96 %.  1.  General: Caucasian female  in no acute distress  2. Psychiatric:  Intact judgement and  insight, awake alert, oriented x 3.  3. Neurologic: All cranial nerves intact.Strength 5/5 in both upper extremities, 5 over 5 in the left lower extremity, 2/5 in the right lower extremity , plantars down going, patellar reflex 2+ and left lower extremity, unobtainable in the lright lower extremity  4. Eyes :  anicteric sclerae, moist conjunctivae with no lid lag. PERRLA.  5. ENMT:  Oropharynx clear with moist mucous membranes and good dentition  6. Neck:  supple, no cervical lymphadenopathy appriciated, No thyromegaly  7. Respiratory : Normal respiratory effort, good air movement bilaterally,clear to  auscultation bilaterally  8. Cardiovascular : RRR, no gallops, rubs or murmurs, no leg edema  9. Gastrointestinal:  Positive bowel sounds, abdomen soft, non-tender to palpation,no hepatosplenomegaly, no rigidity or guarding       10. Skin:  No cyanosis, normal texture and turgor, no rash, lesions or ulcers      Data Review:    CBC  Recent Labs Lab 08/27/16 2141  WBC 9.3  HGB 14.8  HCT 43.7  PLT 360  MCV 85.5  MCH 29.0  MCHC 33.9  RDW 12.7  LYMPHSABS 1.8  MONOABS 0.8  EOSABS 0.1  BASOSABS 0.0   ------------------------------------------------------------------------------------------------------------------  Chemistries   Recent Labs Lab 08/27/16 2141  NA 135  K 2.5*  CL 97*  CO2 27  GLUCOSE 90  BUN 22*  CREATININE 0.76  CALCIUM 9.3    ------------------------------------------------------------------------------------------------------------------  ------------------------------------------------------------------------------------------------------------------ --------------------------------------------------------------------------------------------------------------- Urine analysis:    Component Value Date/Time   COLORURINE YELLOW 08/27/2016 2138   APPEARANCEUR CLEAR 08/27/2016 2138   LABSPEC 1.023 08/27/2016 2138   PHURINE 5.0 08/27/2016 2138   GLUCOSEU NEGATIVE 08/27/2016 2138   HGBUR NEGATIVE 08/27/2016 2138   BILIRUBINUR NEGATIVE 08/27/2016 2138   KETONESUR 20 (A) 08/27/2016 2138   PROTEINUR NEGATIVE 08/27/2016 2138   UROBILINOGEN 4.0 (H) 06/14/2014 0120   NITRITE NEGATIVE 08/27/2016 2138   LEUKOCYTESUR MODERATE (A) 08/27/2016 2138      Imaging Results:    Dg Lumbar Spine Complete  Result Date: 08/27/2016 CLINICAL DATA:  71 y/o F; lower back pain radiating to the right hip after a fall. EXAM: LUMBAR SPINE - COMPLETE 4+ VIEW COMPARISON:  09/26/2014 spinal radiographs FINDINGS: Postsurgical changes of the lumbar spine with a L1 vertebral body prosthesis, L2-3 and L4 through S1 interbody fusion, and a left lateral fusion plate, rod, and screws fixing the T12 through L3 vertebral bodies on the left. Hardware appears intact without apparent hardware related complication. Lumbar lordosis is maintained. No new loss of vertebral body height. Stable mild dextrocurvature. Aortic atherosclerosis with calcification. IMPRESSION: Fusion hardware as described without apparent hardware related complication or acute fracture. Electronically Signed   By: Mitzi Hansen M.D.   On: 08/27/2016 22:49   Dg Pelvis 1-2 Views  Result Date: 08/27/2016 CLINICAL DATA:  71 y/o F; back pain radiating to the right hip after fall. EXAM: PELVIS - 1-2 VIEW COMPARISON:  Concurrent lumbar spine radiographs. FINDINGS: There is no  evidence of pelvic fracture or diastasis. No pelvic bone lesions are seen. Lumbar fusion hardware degenerative changes are better characterized on concurrent lumbar spine radiographs. IMPRESSION: No acute fracture or dislocation identified. Electronically Signed   By: Mitzi Hansen M.D.   On: 08/27/2016 22:50   Ct Head Wo Contrast  Result Date: 08/27/2016 CLINICAL DATA:  Right leg pain decreased sensation to the bilateral lower extremities history of a fall EXAM: CT HEAD WITHOUT  CONTRAST CT CERVICAL SPINE WITHOUT CONTRAST TECHNIQUE: Multidetector CT imaging of the head and cervical spine was performed following the standard protocol without intravenous contrast. Multiplanar CT image reconstructions of the cervical spine were also generated. COMPARISON:  05/29/2014, MRI 05/28/2015 FINDINGS: CT HEAD FINDINGS Brain: No large vessel territorial infarct is visualized. Along the left posterior parasagittal falx, at the junction of the parietal and occipital lobes, there is a focal hyperdensity measuring 1.4 by 1.2 by 1.5 cm with smaller focus of hyperattenuation slightly posterior and inferior. There is mild surrounding low attenuation consistent with edema. No midline shift. Mild atrophy is present. Mild periventricular white matter small vessel disease. Vascular: No hyperdense vessels. Carotid artery calcifications are present. Skull: Mastoid air cells are clear.  No skull fracture is seen. Sinuses/Orbits: Mucosal thickening within the maxillary and ethmoid sinuses. No acute orbital abnormality. Other: None CT CERVICAL SPINE FINDINGS Alignment: Straightening of the cervical spine. Minimal anterolisthesis of C2 on C3, unchanged. Facet alignment is maintained. Skull base and vertebrae: Craniovertebral junction appears intact. There is no fracture identified. Patient is status post anterior plate and screw fixation of C3 through C6 with bony fusion present. Soft tissues and spinal canal: No prevertebral  fluid or swelling. No visible canal hematoma. Disc levels: Moderate degenerative disc changes at C6-C7. Moderate canal stenosis C5-C6. Multilevel foraminal stenosis, most notable at C5-C6. Upper chest: Lung apices clear.  Imaged thyroid unremarkable. Other: None IMPRESSION: 1. Left posterior parasagittal focal hyperdensity measuring 1.5 cm with surrounding mild edema. No midline shift. Findings could relate to a small intraparenchymal bleed although a hyperdense or hemorrhagic mass is also included in the differential. MRI with and without contrast is recommended for further evaluation. 2. Mild atrophy and mild periventricular white matter small vessel disease 3. Status post anterior surgical plate and screw fixation of C3 through C6. No acute osseous abnormality. Critical Value/emergent results were called by telephone at the time of interpretation on 08/27/2016 at 11:19 pm to Dr. Linwood Dibbles , who verbally acknowledged these results. Electronically Signed   By: Jasmine Pang M.D.   On: 08/27/2016 23:19   Ct Cervical Spine Wo Contrast  Result Date: 08/27/2016 CLINICAL DATA:  Right leg pain decreased sensation to the bilateral lower extremities history of a fall EXAM: CT HEAD WITHOUT CONTRAST CT CERVICAL SPINE WITHOUT CONTRAST TECHNIQUE: Multidetector CT imaging of the head and cervical spine was performed following the standard protocol without intravenous contrast. Multiplanar CT image reconstructions of the cervical spine were also generated. COMPARISON:  05/29/2014, MRI 05/28/2015 FINDINGS: CT HEAD FINDINGS Brain: No large vessel territorial infarct is visualized. Along the left posterior parasagittal falx, at the junction of the parietal and occipital lobes, there is a focal hyperdensity measuring 1.4 by 1.2 by 1.5 cm with smaller focus of hyperattenuation slightly posterior and inferior. There is mild surrounding low attenuation consistent with edema. No midline shift. Mild atrophy is present. Mild  periventricular white matter small vessel disease. Vascular: No hyperdense vessels. Carotid artery calcifications are present. Skull: Mastoid air cells are clear.  No skull fracture is seen. Sinuses/Orbits: Mucosal thickening within the maxillary and ethmoid sinuses. No acute orbital abnormality. Other: None CT CERVICAL SPINE FINDINGS Alignment: Straightening of the cervical spine. Minimal anterolisthesis of C2 on C3, unchanged. Facet alignment is maintained. Skull base and vertebrae: Craniovertebral junction appears intact. There is no fracture identified. Patient is status post anterior plate and screw fixation of C3 through C6 with bony fusion present. Soft tissues and spinal canal: No prevertebral fluid  or swelling. No visible canal hematoma. Disc levels: Moderate degenerative disc changes at C6-C7. Moderate canal stenosis C5-C6. Multilevel foraminal stenosis, most notable at C5-C6. Upper chest: Lung apices clear.  Imaged thyroid unremarkable. Other: None IMPRESSION: 1. Left posterior parasagittal focal hyperdensity measuring 1.5 cm with surrounding mild edema. No midline shift. Findings could relate to a small intraparenchymal bleed although a hyperdense or hemorrhagic mass is also included in the differential. MRI with and without contrast is recommended for further evaluation. 2. Mild atrophy and mild periventricular white matter small vessel disease 3. Status post anterior surgical plate and screw fixation of C3 through C6. No acute osseous abnormality. Critical Value/emergent results were called by telephone at the time of interpretation on 08/27/2016 at 11:19 pm to Dr. Linwood Dibbles , who verbally acknowledged these results. Electronically Signed   By: Jasmine Pang M.D.   On: 08/27/2016 23:19      Assessment & Plan:    Active Problems:   Right leg weakness   1. Intraparenchymal hyperdensity- CT head shows intraparenchymal hyperdensity measuring 1.5 cm with surrounding mild edema. I called and  discussed with neurologist on call Dr Otelia Limes, reviewed the CT head and says that lesion probably is subacute and not acute and it could be broad differential diagnosis including brain abscess, hemorrhagic metastasis, intraparenchymal bleed, hemorrhagic stroke. He agrees with getting MRI brain in a.m., will get formal neurology consultation in a.m. we'll check neuro checks every 4 hours. As edema is very mild he did not recommend starting IV steroids. Patient is out of TPA window and also TPA would be contraindicated in case of intraparenchymal hemorrhage. 2. Right leg weakness- could be due to above, patient also has history of L1 vertebral fracture, back surgery. X-ray lumbosacral spine did not show any acute abnormality. Will await MRI brain results. 3. Hypertension-will hold Cardizem, hydrochlorothiazide at this time For permissive hypertension in case patient has CVA.  4. Hypokalemia- potasium is 2.5, will replace potassium and check bmp in am.   DVT Prophylaxis-   SCDs   AM Labs Ordered, also please review Full Orders  Family Communication: Admission, patients condition and plan of care including tests being ordered have been discussed with the patient  who indicate understanding and agree with the plan and Code Status.Unable to contact spouse on phone.  Code Status:  Full code  Admission status: Observation  Time spent in minutes : 60 min   Ramey Ketcherside S M.D on 08/28/2016 at 1:17 AM  Between 7am to 7pm - Pager - [phone]. After 7pm go to [email or web address] - password [facility]  [facility] - Office  [phone]

## 2016-08-28 NOTE — Progress Notes (Signed)
Pt being transported down for MRI by transport team. Fluids currently stopped.

## 2016-08-28 NOTE — Progress Notes (Signed)
Patient seen and examined. Database reviewed. No family members present at time of evaluation. Patient admitted earlier today after a fall resulted in a 1.4 cm intraparenchymal hematoma. This was discussed with Dr. Dutch QuintPoole, neurosurgeon, who states no further follow-up inpatient or outpatient is required for this issue. Patient has been having frequent falls and has fallen at least 6 times in the past 2 weeks at home. Upon PT evaluation she was noted to be orthostatic. Have started her on IV fluids, will check a cortisol and a TSH level, placed TED hose. Not ready for discharge today.  Nichole PittEstela Hernandez, MD [facility] Pager: [phone]

## 2016-08-28 NOTE — Consult Note (Signed)
Carrabelle A. Merlene Laughter, MD     [email or web address]          Nichole [name] is an 71 y.o. female.   ASSESSMENT/PLAN: Acute on chronic gait impairment: Etiology unclear but pain is the major complaint and limiting factor. No structural, imaging or findings on physical examination to suggest a primary neurological problem. 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  An EEG will be suggested. Physical and occupational therapist suggested.   Significant cognitive impairment at baseline: Dementia labs will be done.    The patient is a 71 year old white female who cannot provide much history as to why she is in the hospital. It appears however that she presented with the severe pain involving the right lower extremity. The workup has mostly been unrevealing. 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 Unfortunately, she apparently was standing up within nurse today because of complaining of some problems and the apparently may have reinjured her right knee. She is complaining of a lot of pain there at night. Patient cannot clearly state why she came to the hospital other than her husband brought her to the hospital. Actually she thinks she is in a nursing home facility. She does not complain of other symptoms at this time although the review of systems is unreliable. As a baseline history of dementia.   GENERAL: The patient is markedly overweight. She appears to be in significant discomfort from right knee and right ankle pain.  HEENT: Supple. Atraumatic normocephalic.   ABDOMEN: soft  EXTREMITIES: No edema. 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 There is marked soreness involving the right ankle and right knee. However, I appreciate no swelling. There is also no evidence of bruising or  lacerations.  BACK: Normal.  SKIN: Normal by inspection.    MENTAL STATUS: She is awake and cooperative with evaluation. She knows that it is 2008 and January. She thinks that she is in a nursing home facility. She cannot provide adequate history as to her medical well-being however and why she is in the hospital.  CRANIAL NERVES: Pupils are equal, round and reactive to light and accommodation; extra ocular movements are full, there is no significant nystagmus; visual fields are full; upper and lower facial muscles are normal in strength and symmetric, there is no flattening of the nasolabial folds; tongue is midline; uvula is midline; shoulder elevation is normal.  MOTOR: She has normal strength of the upper extremities 5/5. The right leg is quite painful and weak 1/5. The nurse reports that she was at least 3 earlier today before she reinjured her right leg. Left leg is 4/5.  COORDINATION: Left finger to nose is normal, right finger to nose is normal, No rest tremor; no intention tremor; no postural tremor; no bradykinesia.  REFLEXES: Deep tendon reflexes are symmetrical and normal. Babinski reflexes are upgoing on the left and equivocal on the right.  SENSATION: Normal to light touch and to pain.             The brain MRI is reviewed in person. 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 It abuts the calcarine fissure. It is dark on DWI but has a other cord that is bright. This bright area is also bright on ADC although the ADC also has a central area  of increased signal. This area also shows evidence of encephalomalacia on T1. This area is uniformly dark on SWI. There does not appear to be a clear acute ischemic or hemorrhagic event. The area seems to evolve normally compared to the acute blood seen on CT in 2016.  Blood pressure (!) 155/70, pulse 60, temperature 98.2 F (36.8 C), temperature source Oral, resp. rate 20, height 5'  1" (1.549 m), weight 171 lb 11.2 oz (77.9 kg), SpO2 97 %.  Past Medical History:  Diagnosis Date  . Anxiety   . Asthma   . Colon polyp   . COPD (chronic obstructive pulmonary disease) ([place])   . Depression   . Diverticular disease   . Hyperlipidemia   . Hypertension   . IFG (impaired fasting glucose)   . Memory loss   . Mild sleep apnea     Past Surgical History:  Procedure Laterality Date  . ABDOMINAL HYSTERECTOMY    . ABDOMINAL SURGERY    . ANTERIOR LAT LUMBAR FUSION Left 06/22/2014   Procedure: Left Lumbar one Corpectomy with Fusion Thoracic twelve-Lumbar two,lateral plating,posterior percutaneous pedicle screws;  Surgeon: Charlie Pitter, MD;  Location: [facility];  Service: Neurosurgery;  Laterality: Left;  . APPENDECTOMY    . BACK SURGERY    . CERVICAL FUSION  October 2007  . LUMBAR PERCUTANEOUS PEDICLE SCREW 1 LEVEL N/A 06/22/2014   Procedure: LUMBAR PERCUTANEOUS PEDICLE SCREW 1 LEVEL;  Surgeon: Charlie Pitter, MD;  Location: [facility];  Service: Neurosurgery;  Laterality: N/A;  . NECK SURGERY    . ORIF TIBIA FRACTURE Left 06/04/2014   Procedure: OPEN REDUCTION INTERNAL FIXATION (ORIF) PILON FRACTURE AND OPEN REDUCTION INTERNAL FIXATION (ORIF) OF SYNDESMOTIC FRACTURE;  Surgeon: Marybelle Killings, MD;  Location: [facility];  Service: Orthopedics;  Laterality: Left;  Big C-arm, Flat Jackson, Biomet Anterolateral plates, cancellous bone graft 20cc. Surgeon will call. Available after 5pm  . ORIF TIBIA FRACTURE Left 06/14/2014   DR Hal Neer  . SIGMOIDECTOMY    . TONSILLECTOMY      Family History  Problem Relation Age of Onset  . Hypertension Father   . Diabetes Father   . Heart attack Father     Social History:  reports that she quit smoking about 22 years ago. Her smoking use included Cigarettes. She started smoking about 54 years ago. She has a 16.00 pack-year smoking history. She has never used smokeless tobacco. She reports that she does not drink alcohol or use  drugs.  Allergies:  Allergies  Allergen Reactions  . Shellfish Allergy Anaphylaxis  . Iodine Other (See Comments)    Unknown   . Iohexol      Code: SOB, Desc: PT STATES SHE WENT INTO ANAPHYLACTIC SHOCK 37YRS AGO AFTER CT WITH CONTRAST AT [place], Onset Date: [id number]   . Morphine And Related Other (See Comments)    Unknown   . Penicillins Rash    Medications: Prior to Admission medications   Medication Sig Start Date End Date Taking? Authorizing Provider  albuterol (VENTOLIN HFA) 108 (90 Base) MCG/ACT inhaler INHALE 2 PUFFS EVERY 6 HOURS AS NEEDED FOR WHEEZING 07/02/16  Yes Mikey Kirschner, MD  ALPRAZolam Duanne Moron) 1 MG tablet TAKE 1/2 TABLET 3 TIMES DAILY AS NEEDED. Patient taking differently: TAKE 1/2 TABLET 3 TIMES DAILY AS NEEDED FOR ANXIETY 01/15/16  Yes Mikey Kirschner, MD  citalopram (CELEXA) 40 MG tablet TAKE (1) TABLET BY MOUTH EACH MORNING. 06/10/16  Yes Mikey Kirschner, MD  diltiazem (CARDIZEM CD) 240 MG 24 hr capsule TAKE ONE CAPSULE BY MOUTH ONCE DAILY. 03/30/16  Yes Mikey Kirschner, MD  donepezil (ARICEPT) 10 MG tablet Take 10 mg by mouth every morning.   Yes Historical Provider, MD  doxazosin (CARDURA) 2 MG tablet TAKE ONE TABLET BY MOUTH ONCE DAILY. 07/28/16  Yes Mikey Kirschner, MD  hydrochlorothiazide (HYDRODIURIL) 25 MG tablet TAKE ONE TABLET BY MOUTH ONCE DAILY. 07/20/16  Yes Mikey Kirschner, MD  HYDROcodone-acetaminophen (NORCO/VICODIN) 5-325 MG tablet TAKE (1) TABLET BY MOUTH TWICE DAILY AS NEEDED FOR MODERATE PAIN. 08/07/16  Yes Mikey Kirschner, MD  meclizine (ANTIVERT) 25 MG tablet TAKE (1) TABLET BY MOUTH THREE TIMES DAILY AS NEEDED FOR DIZZINESS. 06/10/16  Yes Mikey Kirschner, MD  meloxicam (MOBIC) 7.5 MG tablet TAKE ONE TABLET BY MOUTH ONCE DAILY. 07/20/16  Yes Mikey Kirschner, MD  oxybutynin (DITROPAN-XL) 5 MG 24 hr tablet TAKE (1) TABLET BY MOUTH EACH MORNING. 07/20/16  Yes Mikey Kirschner, MD    Scheduled Meds: . citalopram  40 mg Oral Daily  .  donepezil  10 mg Oral q morning - 10a  . oxybutynin  5 mg Oral QHS   Continuous Infusions: . sodium chloride 100 mL/hr at 08/28/16 1538   PRN Meds:.albuterol, ALPRAZolam, HYDROcodone-acetaminophen     Results for orders placed or performed during the hospital encounter of 08/27/16 (from the past 48 hour(s))  Urinalysis, Routine w reflex microscopic     Status: Abnormal   Collection Time: 08/27/16  9:38 PM  Result Value Ref Range   Color, Urine YELLOW YELLOW   APPearance CLEAR CLEAR   Specific Gravity, Urine 1.023 1.005 - 1.030   pH 5.0 5.0 - 8.0   Glucose, UA NEGATIVE NEGATIVE mg/dL   Hgb urine dipstick NEGATIVE NEGATIVE   Bilirubin Urine NEGATIVE NEGATIVE   Ketones, ur 20 (A) NEGATIVE mg/dL   Protein, ur NEGATIVE NEGATIVE mg/dL   Nitrite NEGATIVE NEGATIVE   Leukocytes, UA MODERATE (A) NEGATIVE   RBC / HPF 0-5 0 - 5 RBC/hpf   WBC, UA 6-30 0 - 5 WBC/hpf   Bacteria, UA FEW (A) NONE SEEN  CBC with Differential     Status: None   Collection Time: 08/27/16  9:41 PM  Result Value Ref Range   WBC 9.3 4.0 - 10.5 K/uL   RBC 5.11 3.87 - 5.11 MIL/uL   Hemoglobin 14.8 12.0 - 15.0 g/dL   HCT 43.7 36.0 - 46.0 %   MCV 85.5 78.0 - 100.0 fL   MCH 29.0 26.0 - 34.0 pg   MCHC 33.9 30.0 - 36.0 g/dL   RDW 12.7 11.5 - 15.5 %   Platelets 360 150 - 400 K/uL   Neutrophils Relative % 71 %   Neutro Abs 6.7 1.7 - 7.7 K/uL   Lymphocytes Relative 19 %   Lymphs Abs 1.8 0.7 - 4.0 K/uL   Monocytes Relative 9 %   Monocytes Absolute 0.8 0.1 - 1.0 K/uL   Eosinophils Relative 1 %   Eosinophils Absolute 0.1 0.0 - 0.7 K/uL   Basophils Relative 0 %   Basophils Absolute 0.0 0.0 - 0.1 K/uL  Basic metabolic panel     Status: Abnormal   Collection Time: 08/27/16  9:41 PM  Result Value Ref Range   Sodium 135 135 - 145 mmol/L   Potassium 2.5 (LL) 3.5 - 5.1 mmol/L    Comment: CRITICAL RESULT CALLED TO, READ BACK BY AND VERIFIED WITH: WALL,E AT 2250 ON 08/28/2015  BY ISLEY,B    Chloride 97 (L) 101 - 111  mmol/L   CO2 27 22 - 32 mmol/L   Glucose, Bld 90 65 - 99 mg/dL   BUN 22 (H) 6 - 20 mg/dL   Creatinine, Ser 0.76 0.44 - 1.00 mg/dL   Calcium 9.3 8.9 - 10.3 mg/dL   GFR calc non Af Amer >60 >60 mL/min   GFR calc Af Amer >60 >60 mL/min    Comment: (NOTE) The eGFR has been calculated using the CKD EPI equation. This calculation has not been validated in all clinical situations. eGFR's persistently <60 mL/min signify possible Chronic Kidney Disease.    Anion gap 11 5 - 15  CBC     Status: None   Collection Time: 08/28/16  6:14 AM  Result Value Ref Range   WBC 7.7 4.0 - 10.5 K/uL   RBC 4.68 3.87 - 5.11 MIL/uL   Hemoglobin 13.5 12.0 - 15.0 g/dL   HCT 40.1 36.0 - 46.0 %   MCV 85.7 78.0 - 100.0 fL   MCH 28.8 26.0 - 34.0 pg   MCHC 33.7 30.0 - 36.0 g/dL   RDW 12.7 11.5 - 15.5 %   Platelets 352 150 - 400 K/uL  Comprehensive metabolic panel     Status: Abnormal   Collection Time: 08/28/16  6:14 AM  Result Value Ref Range   Sodium 136 135 - 145 mmol/L   Potassium 2.7 (LL) 3.5 - 5.1 mmol/L    Comment: CRITICAL RESULT CALLED TO, READ BACK BY AND VERIFIED WITH: FOLEY,B AT 7:35AM ON 08/28/16 BY FESTERMAN,C    Chloride 100 (L) 101 - 111 mmol/L   CO2 28 22 - 32 mmol/L   Glucose, Bld 95 65 - 99 mg/dL   BUN 19 6 - 20 mg/dL   Creatinine, Ser 0.54 0.44 - 1.00 mg/dL   Calcium 8.8 (L) 8.9 - 10.3 mg/dL   Total Protein 6.5 6.5 - 8.1 g/dL   Albumin 3.7 3.5 - 5.0 g/dL   AST 34 15 - 41 U/L   ALT 29 14 - 54 U/L   Alkaline Phosphatase 88 38 - 126 U/L   Total Bilirubin 1.4 (H) 0.3 - 1.2 mg/dL   GFR calc non Af Amer >60 >60 mL/min   GFR calc Af Amer >60 >60 mL/min    Comment: (NOTE) The eGFR has been calculated using the CKD EPI equation. This calculation has not been validated in all clinical situations. eGFR's persistently <60 mL/min signify possible Chronic Kidney Disease.    Anion gap 8 5 - 15  TSH     Status: None   Collection Time: 08/28/16  4:52 PM  Result Value Ref Range   TSH 3.237  0.350 - 4.500 uIU/mL    Comment: Performed by a 3rd Generation assay with a functional sensitivity of <=0.01 uIU/mL.    Studies/Results:     Kona Yusuf A. Merlene Laughter, M.D.  Diplomate, Tax adviser of Psychiatry and Neurology ( Neurology). 08/28/2016, 7:34 PM

## 2016-08-28 NOTE — Progress Notes (Signed)
Initial Nutrition Assessment  DOCUMENTATION CODES:  Obesity unspecified  INTERVENTION:  Pt reports bad constipation. No bm x 3 days. Would consider bowel regimen.   Add Kozy shack pudding to meals  NUTRITION DIAGNOSIS:  Inadequate oral intake related to poor appetite as evidenced by per patient/family report.  GOAL:  Patient will meet greater than or equal to 90% of their needs  MONITOR:  PO intake, Supplement acceptance, Diet advancement, Labs  REASON FOR ASSESSMENT:  Malnutrition Screening Tool    ASSESSMENT:  71 y/o female PMHx COPD, HTN, HLD, Depression, Anxiety. Presented to ED with 2 month history of RLE weakness. She has had to drag her leg. Fell yesterday due to it. CT head/cervical spine showed Left posterior parasagittal focal hyperdensity  Admitted for further eval/workup  Pt reports that she has not had an appetite for roguly the last 2-3 weeks. She has not had any associated N/V/D. She reports constipation, potentially due to inadequate fluid/po intake. She doesn't take any laxatives or softeners. She does not follow any type of diet at home, though she says she eats mostly vegetables. She is not a big meat eater. She takes vitamins "sometimes". Does not drink any nutritional beverages.   She says her UBW is 140 lbs? Marland Kitchen. She was surprised when she was told she weighed 170 lbs. She said she had gained weight over the past year. Per chart review, her last wt measurement was 180 lbs 3 weeks ago, though this was an outpatient appointment and likely had heavier clothes on. She  is about the same weight as she was 6 months ago.  Patient was not too willing to try nutritional supplements. She does not have any appetite at this time, though said she likes pudding and would likely eat that if brought to her. Currently NPO for procedure.   NFPE: Appears to have mild temporal wasting.   Diet Order:  Diet NPO time specified  Skin:  Reviewed, no issues  Last BM:  1/9  Height:   Ht Readings from Last 1 Encounters:  08/27/16 5\' 1"  (1.549 m)   Weight:  Wt Readings from Last 1 Encounters:  08/28/16 171 lb 11.2 oz (77.9 kg)   Wt Readings from Last 10 Encounters:  08/28/16 171 lb 11.2 oz (77.9 kg)  08/07/16 180 lb (81.6 kg)  03/09/16 173 lb 6.4 oz (78.7 kg)  12/09/15 163 lb 6 oz (74.1 kg)  11/05/15 158 lb (71.7 kg)  09/09/15 154 lb (69.9 kg)  08/07/15 150 lb (68 kg)  07/08/15 147 lb (66.7 kg)  06/03/15 149 lb (67.6 kg)  05/28/15 151 lb (68.5 kg)   Ideal Body Weight:  47.73 kg  BMI:  Body mass index is 32.44 kg/m.  Estimated Nutritional Needs:  Kcal:  1500-1650 kcals (19-21 kcal/kg bw) Protein:  53-62 g pro (1.1-1.2 g/kg ibw) Fluid:  1.5-1.7 L (1 ml/kal)  EDUCATION NEEDS:  No education needs identified at this time  Christophe LouisNathan Franks RD, LDN, CNSC Clinical Nutrition Pager: [phone] 08/28/2016 11:39 AM

## 2016-08-28 NOTE — Progress Notes (Signed)
CRITICAL VALUE ALERT  Critical value received:  2.7 Potassium  Date of notification:  08/29/15  Time of notification:  0755  Critical value read back:Yes.    Nurse who received alert:  Joslyn DevonBrittany Nahdia Doucet, RN  MD notified (1st page):  Dr. Ardyth HarpsHernandez  Time of first page: 0801  MD notified (2nd page):  Time of second page:  Responding MD:  Dr Ardyth HarpsHernandez  Time MD responded:  [phone]

## 2016-08-28 NOTE — Care Management Obs Status (Signed)
MEDICARE OBSERVATION STATUS NOTIFICATION   Patient Details  Name: Nichole [name] MRN: [id number] Date of Birth: [birth year]   Medicare Observation Status Notification Given:  Yes    Jejuan Scala, [name], RN 08/28/2016, 3:03 PM

## 2016-08-28 NOTE — Care Management Note (Signed)
Case Management Note  Patient Details  Name: Nichole [name] MRN: [id number] Date of Birth: 04/21/1946  Subjective/Objective:   Patient adm with right leg weakness. She has been recommended for SNF but this is not an option due to insurance. Home health PT and RN offered. Patient and husband agreeable. Offered choice. Alroy BailiffLinda Lothian [facility] notified and will obtain orders from chart.                  Action/Plan:[facility]. WC to be delivered to room prior to discharge. [facility].    Expected Discharge Date:            08/28/2016      Expected Discharge Plan:  Home w Home Health Services  In-House Referral:  NA  Discharge planning Services  CM Consult  Post Acute Care Choice:    Choice offered to:  Patient, Spouse  DME Arranged:  Wheelchair manual DME Agency:  [facility]  HH Arranged[facility]  [facility]  Status of Service:  Completed, signed off  If discussed at Long Length of Stay Meetings, dates discussed:    Additional Comments:  Beckie Viscardi, [name], RN 08/28/2016, 3:00 PM

## 2016-08-28 NOTE — Evaluation (Signed)
Physical Therapy Evaluation Patient Details Name: Nichole [name] MRN: [id number] DOB: Feb 17, 1946 Today's Date: 08/28/2016   History of Present Illness   71 y.o. female,With history of COPD, hypertension, hyperlipidemia who was brought to the hospital with right leg pain. Patient says that she noticed weakness in right lower extremity about 2 months ago, and she has been dragging her right leg since then. Yesterday he says that she fell, hitting head. She also says that there was loss of consciousness for a short time.  MRI of the head shows a 14mm hematoma in the L posterior cerebral cortex with minimal edema and without mass effect.       Clinical Impression  Pt received in the room with [facility], with patient nearly missing the EOB, and PT rushed in to ensure patient was safely positioned in the bed.  PT states she normally does not use any DME for ambulation, and she is independent with dressing, and sponge bathing.  She is able to ambulate in the community, but fatigues quickly per the husband.  Pt and husband express that she has had >15 falls in the past 6 months and most of the time it is due to the pt feeling dizzy when she goes to stand up.  During PT evaluation , orthostatic vitals were obtained, and are as follows:  Supine: BP 174/65, HR: 53bpm Sitting: BP 174/75, HR: 60bpm Standing: BP 100/84, HR: 77bpm and pt expressed extreme dizziness.    Pt was able to ambulate from the bed into the bathroom with RW and Mod A due to R knee buckling during gait and continued dizziness.  At this point, she is recommended for SNF, however the husband states they have had a bad experience at [facility], and are not sure they want to pursue that route.  If pt ends up going home, she will need strict 24/7 supervision/assistance, HHPT, and a w/c.         Follow Up Recommendations SNF;Supervision/Assistance - 24 hour;Home health PT (Patient and husband expressed  they have had a negative experience at SNF, but did not formally decline. )    Equipment Recommendations  Wheelchair (measurements PT);Wheelchair cushion (measurements PT)    Recommendations for Other Services       Precautions / Restrictions Precautions Precautions: Fall Precaution Comments: Pt states that she gets dizzy looses her balance.  Other times the legs gave out.  Husband and patient report >15 falls in the past 6months.   Restrictions Weight Bearing Restrictions: No      Mobility  Bed Mobility Overal bed mobility: Modified Independent                Transfers Overall transfer level: Needs assistance Equipment used: Rolling Poyser (2 wheeled) Transfers: Sit to/from Stand Sit to Stand: Mod assist         General transfer comment: vc's for hand placement to push up from what she is sitting on .   Ambulation/Gait Ambulation/Gait assistance: Mod assist Ambulation Distance (Feet): 15 Feet Assistive device: Rolling Bruni (2 wheeled) Gait Pattern/deviations: Step-to pattern;Trunk flexed;Ataxic     General Gait Details: Pt demonstrated 1 episode of significant knee buckling during gait  Stairs            Wheelchair Mobility    Modified Rankin (Stroke Patients Only)       Balance Overall balance assessment: History of Falls;Needs assistance Sitting-balance support: Bilateral upper extremity supported;Feet supported Sitting balance-Leahy Scale:  Fair     Standing balance support: Bilateral upper extremity supported Standing balance-Leahy Scale: Zero                               Pertinent Vitals/Pain Pain Assessment: 0-10 Pain Score: 6  Pain Location: R LE, and back of her head, and back.   Pain Descriptors / Indicators: Aching;Burning;Sharp;Throbbing;Stabbing Pain Intervention(s): Limited activity within patient's tolerance;Monitored during session;Repositioned    Home Living   Living Arrangements: Spouse/significant  other Available Help at Discharge: Available PRN/intermittently Type of Home: House Home Access: Stairs to enter   [employer] of Steps: 1 Home Layout: One level Home Equipment: Cane - single point;[place] - 2 wheels      Prior Function     Gait / Transfers Assistance Needed: independent for ambulation.  Community ambulator - husband states she would give out.  ADL's / Homemaking Assistance Needed: Husband drives, independent with dressing, independent with sponge bath.         Hand Dominance   Dominant Hand: Right    Extremity/Trunk Assessment   Upper Extremity Assessment Upper Extremity Assessment: Generalized weakness    Lower Extremity Assessment Lower Extremity Assessment: Generalized weakness;RLE deficits/detail RLE Deficits / Details: hip flexor 2/5, knee extension 3-/5, and ankle DF: 3/5 all observed with functional mobility.        Communication   Communication: No difficulties  Cognition Arousal/Alertness: Awake/alert Behavior During Therapy: WFL for tasks assessed/performed Overall Cognitive Status: History of cognitive impairments - at baseline                      General Comments      Exercises     Assessment/Plan    PT Assessment Patient needs continued PT services  PT Problem List Decreased strength;Decreased activity tolerance;Decreased balance;Decreased mobility;Decreased cognition;Decreased knowledge of precautions;Obesity;Pain;Decreased coordination          PT Treatment Interventions DME instruction;Gait training;Functional mobility training;Therapeutic activities;Therapeutic exercise;Balance training;Neuromuscular re-education;Patient/family education;Wheelchair mobility training    PT Goals (Current goals can be found in the Care Plan section)  Acute Rehab PT Goals Patient Stated Goal: Pt wants to go home.  PT Goal Formulation: With patient/family Time For Goal Achievement: 09/04/16 Potential to Achieve Goals:  Fair    Frequency Min 5X/week   Barriers to discharge        Co-evaluation               End of Session Equipment Utilized During Treatment: Gait belt Activity Tolerance: Patient limited by fatigue Patient left: in bed;with call bell/phone within reach;with family/visitor present Nurse Communication: Mobility status Lowanda Foster, RN notified of pt's location, and mobility status.  Mobility sheet left up in the room.)    Functional Assessment Tool Used: [employer] AM-PAC "6-clicks"  Functional Limitation: Mobility: Walking and moving around Mobility: Walking and Moving Around Current Status [phone]): At least 40 percent but less than 60 percent impaired, limited or restricted Mobility: Walking and Moving Around Goal Status [phone]): At least 20 percent but less than 40 percent impaired, limited or restricted    Time: 1330-1419 PT Time Calculation (min) (ACUTE ONLY): 49 min   Charges:         PT G Codes:   PT G-Codes **NOT FOR INPATIENT CLASS** Functional Assessment Tool Used: [employer] "6-clicks"  Functional Limitation: Mobility: Walking and moving around Mobility: Walking and Moving Around Current Status [phone]): At least 40 percent but  less than 60 percent impaired, limited or restricted Mobility: Walking and Moving Around Goal Status [phone]): At least 20 percent but less than 40 percent impaired, limited or restricted    Beth Ruddy Swire, PT, DPT X: [phone]

## 2016-08-28 NOTE — Progress Notes (Signed)
Entered pt room secondary to bed alarm.  Pt sitting on side of bed with alarm going off.  Pt notes she moved slightly and the alarm went off, "it has been going off with the slightest movement all day".  Bed setting set to alarm with Pt standing OOB.  Attempted to reset alarm with alarm going off with pt sitting still in bed.  Assisted pt to stand to reset bed scale and zero the bed.  With pt standing her right foot began to slip and she twisted her knee as she sat back down.  Pt was able to stand again with bed successfully reset.  Pt notes her knee is now hurting.    Returned to pt room with continued pain 10 minutes later.  Ice placed to pt knee, no noticeable swelling noted.  Dr Gerilyn Pilgrimoonquah to pt bedside informed of pt findings.  MD agrees to utilizing alternating heat and ice therapy.  Will continue to monitor.

## 2016-08-28 NOTE — Plan of Care (Signed)
Problem: Activity: Goal: Risk for activity intolerance will decrease Outcome: Not Progressing Pt unable to transfer and mobility is impaired.

## 2016-08-29 DIAGNOSIS — R296 Repeated falls: Secondary | ICD-10-CM

## 2016-08-29 DIAGNOSIS — I951 Orthostatic hypotension: Secondary | ICD-10-CM

## 2016-08-29 LAB — CBC
HCT: 39.1 % (ref 36.0–46.0)
Hemoglobin: 13 g/dL (ref 12.0–15.0)
MCH: 28.8 pg (ref 26.0–34.0)
MCHC: 33.2 g/dL (ref 30.0–36.0)
MCV: 86.7 fL (ref 78.0–100.0)
Platelets: 341 10*3/uL (ref 150–400)
RBC: 4.51 MIL/uL (ref 3.87–5.11)
RDW: 12.7 % (ref 11.5–15.5)
WBC: 6 10*3/uL (ref 4.0–10.5)

## 2016-08-29 LAB — BASIC METABOLIC PANEL
Anion gap: 5 (ref 5–15)
BUN: 14 mg/dL (ref 6–20)
CO2: 28 mmol/L (ref 22–32)
Calcium: 8.5 mg/dL — ABNORMAL LOW (ref 8.9–10.3)
Chloride: 108 mmol/L (ref 101–111)
Creatinine, Ser: 0.48 mg/dL (ref 0.44–1.00)
GFR calc Af Amer: >60 mL/min (ref >60)
GFR calc non Af Amer: >60 mL/min (ref >60)
Glucose, Bld: 83 mg/dL (ref 65–99)
Potassium: 3.9 mmol/L (ref 3.5–5.1)
Sodium: 141 mmol/L (ref 135–145)

## 2016-08-29 LAB — MAGNESIUM: Magnesium: 2 mg/dL (ref 1.7–2.4)

## 2016-08-29 LAB — VITAMIN B12: Vitamin B-12: 556 pg/mL (ref 180–914)

## 2016-08-29 MED ORDER — FLUDROCORTISONE ACETATE 0.1 MG PO TABS
0.1000 mg | ORAL_TABLET | Freq: Every day | ORAL | Status: DC
  Administered 2016-08-29 – 2016-09-01 (×4): 0.1 mg via ORAL
  Filled 2016-08-29 (×5): qty 1

## 2016-08-29 NOTE — Progress Notes (Signed)
PROGRESS NOTE    Nichole [name]  ZOX:[id number] DOB: 1946/02/18 DOA: 08/27/2016 PCP: Lubertha South, MD     Brief Narrative:  71 year old woman admitted from home on 1/11 with multiple falls over the past few weeks, the one on day of admission resulted in a 1.4 cm intraparenchymal brain hematoma. She has found to be orthostatic.   Assessment & Plan:   Active Problems:   Right leg weakness   Cerebral hemorrhage (HCC)   Intracerebral hemorrhage -Precipitated by fall. -Discussed via phone with neurosurgeon, Dr. Jordan Likes, with recommendations for no further inpatient or outpatient follow-up.  Frequent falls -Suspect orthostatic hypotension might be playing a role. -TSH and cortisol levels are within normal limits. -Despite aggressive IV fluid repletion she continues to be very orthostatic, will place tight TED stockings and start her on low-dose Florinef. -We'll also check vitamin B-12 to rule out B-12 deficiency.. -She has been seen by physical therapy with recommendations for SNF which she has refused.   DVT prophylaxis: SCDs Code Status: Full code Family Communication: Patient only Disposition Plan: Anticipate discharge home in 24-48 hours  Consultants:   Neurology  Procedures:   None  Antimicrobials:  Anti-infectives    None       Subjective: Still feels dizzy upon ambulation  Objective: Vitals:   08/29/16 0546 08/29/16 1225 08/29/16 1230 08/29/16 1234  BP: (!) 164/61 (!) 170/55 (!) 174/65 (!) 176/77  Pulse: 63 64 70 90  Resp: 18     Temp: 98 F (36.7 C)     TempSrc: Oral     SpO2: 98%     Weight:      Height:        Intake/Output Summary (Last 24 hours) at 08/29/16 1842 Last data filed at 08/29/16 0900  Gross per 24 hour  Intake              480 ml  Output                0 ml  Net              480 ml   Filed Weights   08/27/16 1609 08/28/16 0239  Weight: 81.6 kg (180 lb) 77.9 kg (171 lb 11.2 oz)    Examination:  General exam: Alert, awake,  oriented x 3 Respiratory system: Clear to auscultation. Respiratory effort normal. Cardiovascular system:RRR. No murmurs, rubs, gallops. Gastrointestinal system: Abdomen is nondistended, soft and nontender. No organomegaly or masses felt. Normal bowel sounds heard. Central nervous system: Alert and oriented. No focal neurological deficits. Extremities: No C/C/E, +pedal pulses Skin: No rashes, lesions or ulcers Psychiatry: Judgement and insight appear normal. Mood & affect appropriate.     Data Reviewed: I have personally reviewed following labs and imaging studies  CBC:  Recent Labs Lab 08/27/16 2141 08/28/16 0614 08/29/16 0633  WBC 9.3 7.7 6.0  NEUTROABS 6.7  --   --   HGB 14.8 13.5 13.0  HCT 43.7 40.1 39.1  MCV 85.5 85.7 86.7  PLT 360 352 341   Basic Metabolic Panel:  Recent Labs Lab 08/27/16 2141 08/28/16 0614 08/29/16 0633  NA 135 136 141  K 2.5* 2.7* 3.9  CL 97* 100* 108  CO2 27 28 28   GLUCOSE 90 95 83  BUN 22* 19 14  CREATININE 0.76 0.54 0.48  CALCIUM 9.3 8.8* 8.5*  MG  --   --  2.0   GFR: Estimated Creatinine Clearance: 60 mL/min (by C-G formula based on SCr of 0.48  mg/dL). Liver Function Tests:  Recent Labs Lab 08/28/16 0614  AST 34  ALT 29  ALKPHOS 88  BILITOT 1.4*  PROT 6.5  ALBUMIN 3.7   No results for input(s): LIPASE, AMYLASE in the last 168 hours. No results for input(s): AMMONIA in the last 168 hours. Coagulation Profile: No results for input(s): INR, PROTIME in the last 168 hours. Cardiac Enzymes: No results for input(s): CKTOTAL, CKMB, CKMBINDEX, TROPONINI in the last 168 hours. BNP (last 3 results) No results for input(s): PROBNP in the last 8760 hours. HbA1C: No results for input(s): HGBA1C in the last 72 hours. CBG: No results for input(s): GLUCAP in the last 168 hours. Lipid Profile: No results for input(s): CHOL, HDL, LDLCALC, TRIG, CHOLHDL, LDLDIRECT in the last 72 hours. Thyroid Function Tests:  Recent Labs   08/28/16 1652  TSH 3.237   Anemia Panel:  Recent Labs  08/28/16 2334  VITAMINB12 556   Urine analysis:    Component Value Date/Time   COLORURINE YELLOW 08/27/2016 2138   APPEARANCEUR CLEAR 08/27/2016 2138   LABSPEC 1.023 08/27/2016 2138   PHURINE 5.0 08/27/2016 2138   GLUCOSEU NEGATIVE 08/27/2016 2138   HGBUR NEGATIVE 08/27/2016 2138   BILIRUBINUR NEGATIVE 08/27/2016 2138   KETONESUR 20 (A) 08/27/2016 2138   PROTEINUR NEGATIVE 08/27/2016 2138   UROBILINOGEN 4.0 (H) 06/14/2014 0120   NITRITE NEGATIVE 08/27/2016 2138   LEUKOCYTESUR MODERATE (A) 08/27/2016 2138   Sepsis Labs: @LABRCNTIP (procalcitonin:4,lacticidven:4)  )No results found for this or any previous visit (from the past 240 hour(s)).       Radiology Studies: Dg Lumbar Spine Complete  Result Date: 08/27/2016 CLINICAL DATA:  71 y/o F; lower back pain radiating to the right hip after a fall. EXAM: LUMBAR SPINE - COMPLETE 4+ VIEW COMPARISON:  09/26/2014 spinal radiographs FINDINGS: Postsurgical changes of the lumbar spine with a L1 vertebral body prosthesis, L2-3 and L4 through S1 interbody fusion, and a left lateral fusion plate, rod, and screws fixing the T12 through L3 vertebral bodies on the left. Hardware appears intact without apparent hardware related complication. Lumbar lordosis is maintained. No new loss of vertebral body height. Stable mild dextrocurvature. Aortic atherosclerosis with calcification. IMPRESSION: Fusion hardware as described without apparent hardware related complication or acute fracture. Electronically Signed   By: Mitzi HansenLance  Furusawa-Stratton M.D.   On: 08/27/2016 22:49   Dg Pelvis 1-2 Views  Result Date: 08/27/2016 CLINICAL DATA:  71 y/o F; back pain radiating to the right hip after fall. EXAM: PELVIS - 1-2 VIEW COMPARISON:  Concurrent lumbar spine radiographs. FINDINGS: There is no evidence of pelvic fracture or diastasis. No pelvic bone lesions are seen. Lumbar fusion hardware degenerative  changes are better characterized on concurrent lumbar spine radiographs. IMPRESSION: No acute fracture or dislocation identified. Electronically Signed   By: Mitzi HansenLance  Furusawa-Stratton M.D.   On: 08/27/2016 22:50   Ct Head Wo Contrast  Result Date: 08/27/2016 CLINICAL DATA:  Right leg pain decreased sensation to the bilateral lower extremities history of a fall EXAM: CT HEAD WITHOUT CONTRAST CT CERVICAL SPINE WITHOUT CONTRAST TECHNIQUE: Multidetector CT imaging of the head and cervical spine was performed following the standard protocol without intravenous contrast. Multiplanar CT image reconstructions of the cervical spine were also generated. COMPARISON:  05/29/2014, MRI 05/28/2015 FINDINGS: CT HEAD FINDINGS Brain: No large vessel territorial infarct is visualized. Along the left posterior parasagittal falx, at the junction of the parietal and occipital lobes, there is a focal hyperdensity measuring 1.4 by 1.2 by 1.5 cm with smaller  focus of hyperattenuation slightly posterior and inferior. There is mild surrounding low attenuation consistent with edema. No midline shift. Mild atrophy is present. Mild periventricular white matter small vessel disease. Vascular: No hyperdense vessels. Carotid artery calcifications are present. Skull: Mastoid air cells are clear.  No skull fracture is seen. Sinuses/Orbits: Mucosal thickening within the maxillary and ethmoid sinuses. No acute orbital abnormality. Other: None CT CERVICAL SPINE FINDINGS Alignment: Straightening of the cervical spine. Minimal anterolisthesis of C2 on C3, unchanged. Facet alignment is maintained. Skull base and vertebrae: Craniovertebral junction appears intact. There is no fracture identified. Patient is status post anterior plate and screw fixation of C3 through C6 with bony fusion present. Soft tissues and spinal canal: No prevertebral fluid or swelling. No visible canal hematoma. Disc levels: Moderate degenerative disc changes at C6-C7. Moderate  canal stenosis C5-C6. Multilevel foraminal stenosis, most notable at C5-C6. Upper chest: Lung apices clear.  Imaged thyroid unremarkable. Other: None IMPRESSION: 1. Left posterior parasagittal focal hyperdensity measuring 1.5 cm with surrounding mild edema. No midline shift. Findings could relate to a small intraparenchymal bleed although a hyperdense or hemorrhagic mass is also included in the differential. MRI with and without contrast is recommended for further evaluation. 2. Mild atrophy and mild periventricular white matter small vessel disease 3. Status post anterior surgical plate and screw fixation of C3 through C6. No acute osseous abnormality. Critical Value/emergent results were called by telephone at the time of interpretation on 08/27/2016 at 11:19 pm to Dr. Linwood Dibbles , who verbally acknowledged these results. Electronically Signed   By: Jasmine Pang M.D.   On: 08/27/2016 23:19   Ct Cervical Spine Wo Contrast  Result Date: 08/27/2016 CLINICAL DATA:  Right leg pain decreased sensation to the bilateral lower extremities history of a fall EXAM: CT HEAD WITHOUT CONTRAST CT CERVICAL SPINE WITHOUT CONTRAST TECHNIQUE: Multidetector CT imaging of the head and cervical spine was performed following the standard protocol without intravenous contrast. Multiplanar CT image reconstructions of the cervical spine were also generated. COMPARISON:  05/29/2014, MRI 05/28/2015 FINDINGS: CT HEAD FINDINGS Brain: No large vessel territorial infarct is visualized. Along the left posterior parasagittal falx, at the junction of the parietal and occipital lobes, there is a focal hyperdensity measuring 1.4 by 1.2 by 1.5 cm with smaller focus of hyperattenuation slightly posterior and inferior. There is mild surrounding low attenuation consistent with edema. No midline shift. Mild atrophy is present. Mild periventricular white matter small vessel disease. Vascular: No hyperdense vessels. Carotid artery calcifications are  present. Skull: Mastoid air cells are clear.  No skull fracture is seen. Sinuses/Orbits: Mucosal thickening within the maxillary and ethmoid sinuses. No acute orbital abnormality. Other: None CT CERVICAL SPINE FINDINGS Alignment: Straightening of the cervical spine. Minimal anterolisthesis of C2 on C3, unchanged. Facet alignment is maintained. Skull base and vertebrae: Craniovertebral junction appears intact. There is no fracture identified. Patient is status post anterior plate and screw fixation of C3 through C6 with bony fusion present. Soft tissues and spinal canal: No prevertebral fluid or swelling. No visible canal hematoma. Disc levels: Moderate degenerative disc changes at C6-C7. Moderate canal stenosis C5-C6. Multilevel foraminal stenosis, most notable at C5-C6. Upper chest: Lung apices clear.  Imaged thyroid unremarkable. Other: None IMPRESSION: 1. Left posterior parasagittal focal hyperdensity measuring 1.5 cm with surrounding mild edema. No midline shift. Findings could relate to a small intraparenchymal bleed although a hyperdense or hemorrhagic mass is also included in the differential. MRI with and without contrast is recommended for further  evaluation. 2. Mild atrophy and mild periventricular white matter small vessel disease 3. Status post anterior surgical plate and screw fixation of C3 through C6. No acute osseous abnormality. Critical Value/emergent results were called by telephone at the time of interpretation on 08/27/2016 at 11:19 pm to Dr. Linwood Dibbles , who verbally acknowledged these results. Electronically Signed   By: Jasmine Pang M.D.   On: 08/27/2016 23:19   Mr Brain Wo Contrast  Result Date: 08/28/2016 CLINICAL DATA:  Cerebral hemorrhage. Right leg pain extending to the foot starting 3 days ago. History of dementia. EXAM: MRI HEAD WITHOUT CONTRAST TECHNIQUE: Multiplanar, multiecho pulse sequences of the brain and surrounding structures were obtained without intravenous contrast.  COMPARISON:  05/28/2015 FINDINGS: Brain: Size stable hematoma in the left parasagittal occipital parietal cortex measuring up to 14 mm. The hemorrhage has T2 hypointensity and patchy T1 hyperintensity. Thin rim of surrounding edema without mass effect. The volume of edema is expected for the size of hematoma; no indication of underlying aggressive mass. Possible trace adjacent subdural component seen on FLAIR imaging. There is a rim of DWI hyperintensity, but this appears to be shine through and artifact. No acute hemorrhage or infarct elsewhere. No chronic hemorrhagic changes on gradient imaging to suggest amyloid angiopathy or other hemorrhagic diathesis. Mild periventricular chronic microvascular disease for age. Mild generalized volume loss. Vascular: Preserved flow voids including in the superior sagittal sinus along the hemorrhage. Skull and upper cervical spine: No focal marrow lesion. Artifact at the cervical spine from fusion hardware. Sinuses/Orbits: Negative IMPRESSION: 14 mm hematoma in the left posterior cerebral cortex is size stable from yesterday. Minimal edema without mass effect is also stable. No findings to confirm an underlying cause. No signs of amyloid angiopathy. Electronically Signed   By: Marnee Spring M.D.   On: 08/28/2016 09:07        Scheduled Meds: . citalopram  40 mg Oral Daily  . donepezil  10 mg Oral q morning - 10a  . fludrocortisone  0.1 mg Oral Daily  . oxybutynin  5 mg Oral QHS   Continuous Infusions: . sodium chloride 100 mL/hr at 08/29/16 0945     LOS: 1 day    Time spent: 25 minutes. Greater than 50% of this time was spent in direct contact with the patient coordinating care.     Chaya Jan, MD [facility] Pager [phone]  If 7PM-7AM, please contact night-coverage [email or web address] Password [facility] 08/29/2016, 6:42 PM

## 2016-08-30 DIAGNOSIS — I951 Orthostatic hypotension: Secondary | ICD-10-CM

## 2016-08-30 DIAGNOSIS — R296 Repeated falls: Secondary | ICD-10-CM

## 2016-08-30 LAB — VITAMIN B12: Vitamin B-12: 523 pg/mL (ref 180–914)

## 2016-08-30 LAB — RPR: RPR Ser Ql: NONREACTIVE

## 2016-08-30 NOTE — Progress Notes (Signed)
Pt very agitated and irritable and states her leg "hurts too much and is dead" so she cannot get up. Refusing to sit or stand for orthostatic vital signs. Anxiety and pain medication provided.

## 2016-08-30 NOTE — Progress Notes (Signed)
Patient's orthostatic VS obtained.  Dr. Ardyth HarpsHernandez notified via text page of patient's blood pressure being elevated and of patient's husband wanting to speak.

## 2016-08-30 NOTE — Progress Notes (Signed)
PROGRESS NOTE    Nichole [name]  MWU:[id number] DOB: 1946-01-02 DOA: 08/27/2016 PCP: Lubertha SouthSteve Luking, MD     Brief Narrative:  71 year old woman admitted from home on 1/11 with multiple falls over the past few weeks, the one on day of admission resulted in a 1.4 cm intraparenchymal brain hematoma. She has found to be orthostatic.   Assessment & Plan:   Active Problems:   Right leg weakness   Cerebral hemorrhage (HCC)   Orthostasis   Frequent falls   Intracerebral hemorrhage -Precipitated by fall. -Discussed via phone with neurosurgeon, Dr. Jordan LikesPool, with recommendations for no further inpatient or outpatient follow-up.  Frequent falls -Suspect orthostatic hypotension might be playing a role. -TSH and cortisol levels are within normal limits. -Orthostatic parameters are improved with florinef. -B12 ok at 556... -She has been seen by physical therapy with recommendations for SNF; for which she is now agreeable.   DVT prophylaxis: SCDs Code Status: Full code Family Communication: Patient only Disposition Plan: Anticipate discharge SNF in 24 hours  Consultants:   Neurology  Procedures:   None  Antimicrobials:  Anti-infectives    None       Subjective: Still feels dizzy upon ambulation  Objective: Vitals:   08/29/16 2300 08/30/16 0424 08/30/16 0445 08/30/16 1433  BP:  (!) 201/69 (!) 162/78   Pulse: 60 62    Resp: 18 18    Temp: 97.8 F (36.6 C) 98 F (36.7 C)  98.1 F (36.7 C)  TempSrc: Oral Oral    SpO2: 97% 96%  98%  Weight:      Height:       No intake or output data in the 24 hours ending 08/30/16 1623 Filed Weights   08/27/16 1609 08/28/16 0239  Weight: 81.6 kg (180 lb) 77.9 kg (171 lb 11.2 oz)    Examination:  General exam: Alert, awake, oriented x 3 Respiratory system: Clear to auscultation. Respiratory effort normal. Cardiovascular system:RRR. No murmurs, rubs, gallops. Gastrointestinal system: Abdomen is nondistended, soft and nontender.  No organomegaly or masses felt. Normal bowel sounds heard. Central nervous system: Alert and oriented. No focal neurological deficits. Extremities: No C/C/E, +pedal pulses Skin: No rashes, lesions or ulcers Psychiatry: Judgement and insight appear normal. Mood & affect appropriate.     Data Reviewed: I have personally reviewed following labs and imaging studies  CBC:  Recent Labs Lab 08/27/16 2141 08/28/16 0614 08/29/16 0633  WBC 9.3 7.7 6.0  NEUTROABS 6.7  --   --   HGB 14.8 13.5 13.0  HCT 43.7 40.1 39.1  MCV 85.5 85.7 86.7  PLT 360 352 341   Basic Metabolic Panel:  Recent Labs Lab 08/27/16 2141 08/28/16 0614 08/29/16 0633  NA 135 136 141  K 2.5* 2.7* 3.9  CL 97* 100* 108  CO2 27 28 28   GLUCOSE 90 95 83  BUN 22* 19 14  CREATININE 0.76 0.54 0.48  CALCIUM 9.3 8.8* 8.5*  MG  --   --  2.0   GFR: Estimated Creatinine Clearance: 60 mL/min (by C-G formula based on SCr of 0.48 mg/dL). Liver Function Tests:  Recent Labs Lab 08/28/16 0614  AST 34  ALT 29  ALKPHOS 88  BILITOT 1.4*  PROT 6.5  ALBUMIN 3.7   No results for input(s): LIPASE, AMYLASE in the last 168 hours. No results for input(s): AMMONIA in the last 168 hours. Coagulation Profile: No results for input(s): INR, PROTIME in the last 168 hours. Cardiac Enzymes: No results for input(s): CKTOTAL, CKMB,  CKMBINDEX, TROPONINI in the last 168 hours. BNP (last 3 results) No results for input(s): PROBNP in the last 8760 hours. HbA1C: No results for input(s): HGBA1C in the last 72 hours. CBG: No results for input(s): GLUCAP in the last 168 hours. Lipid Profile: No results for input(s): CHOL, HDL, LDLCALC, TRIG, CHOLHDL, LDLDIRECT in the last 72 hours. Thyroid Function Tests:  Recent Labs  08/28/16 1652  TSH 3.237   Anemia Panel:  Recent Labs  08/28/16 2334  VITAMINB12 556   Urine analysis:    Component Value Date/Time   COLORURINE YELLOW 08/27/2016 2138   APPEARANCEUR CLEAR 08/27/2016 2138     LABSPEC 1.023 08/27/2016 2138   PHURINE 5.0 08/27/2016 2138   GLUCOSEU NEGATIVE 08/27/2016 2138   HGBUR NEGATIVE 08/27/2016 2138   BILIRUBINUR NEGATIVE 08/27/2016 2138   KETONESUR 20 (A) 08/27/2016 2138   PROTEINUR NEGATIVE 08/27/2016 2138   UROBILINOGEN 4.0 (H) 06/14/2014 0120   NITRITE NEGATIVE 08/27/2016 2138   LEUKOCYTESUR MODERATE (A) 08/27/2016 2138   Sepsis Labs: @LABRCNTIP (procalcitonin:4,lacticidven:4)  )No results found for this or any previous visit (from the past 240 hour(s)).       Radiology Studies: No results found.      Scheduled Meds: . citalopram  40 mg Oral Daily  . donepezil  10 mg Oral q morning - 10a  . fludrocortisone  0.1 mg Oral Daily  . oxybutynin  5 mg Oral QHS   Continuous Infusions: . sodium chloride 100 mL/hr at 08/30/16 0638     LOS: 2 days    Time spent: 25 minutes. Greater than 50% of this time was spent in direct contact with the patient coordinating care.     Chaya Jan, MD [facility] Pager [phone]  If 7PM-7AM, please contact night-coverage [email or web address] Password [facility] 08/30/2016, 4:23 PM

## 2016-08-31 ENCOUNTER — Inpatient Hospital Stay (HOSPITAL_COMMUNITY)
Admit: 2016-08-31 | Discharge: 2016-08-31 | Disposition: A | Discharge status: Home / Self Care | Payer: PPO | Attending: Neurology | Admitting: Neurology

## 2016-08-31 DIAGNOSIS — R569 Unspecified convulsions: Secondary | ICD-10-CM | POA: Diagnosis not present

## 2016-08-31 LAB — HOMOCYSTEINE: Homocysteine: 10.7 umol/L (ref 0.0–15.0)

## 2016-08-31 MED ORDER — FLUDROCORTISONE ACETATE 0.1 MG PO TABS: 0.1000 mg | ORAL_TABLET | Freq: Every day | ORAL | Status: DC

## 2016-08-31 MED ORDER — HYDRALAZINE HCL 20 MG/ML IJ SOLN
10.0000 mg | Freq: Once | INTRAMUSCULAR | Status: AC
  Administered 2016-08-31: 10 mg via INTRAVENOUS
  Filled 2016-08-31: qty 1

## 2016-08-31 MED ORDER — HYDROCODONE-ACETAMINOPHEN 5-325 MG PO TABS: ORAL_TABLET | ORAL | 0 refills | Status: DC

## 2016-08-31 NOTE — Clinical Social Work Note (Addendum)
CSW spoke with Simona HuhEarl, patient's husband. Simona Huharl states that he would be open to moving the [facility] if authorization can be switched in time. CSW explained to Simona Huharl that the facility states they would have to hear back very soon in order to make the transfer happen today. At this time, it looks unlikely that we will be able to get the authorization transferred over in a timely manner. CSW has updated Charity fundraiserN. Simona Huharl is agreeable to the patient going [facility] if [facility] still [facility] to [facility] her. MD updated.   Roddie McBryant Bernd Crom MSW, [place] ManorLCSW, [place], [phone]

## 2016-08-31 NOTE — Clinical Social Work Note (Signed)
CSW received call on personal phone from Erich Montanehris Dudley at [facility]. The [facility] was [facility]. Thayer OhmChris states that the family of the patient discharging [facility] has refused to bring the patient home which unfortunately means our patient, Nichole [name], will not have a bed with them today. CSW is trying to see if we can get the [facility] into [facility] this evening. Elease Hashimotoatricia with the facility states they can admit the patient if we can have the authorization changed to their facility quickly. CSW has left message with case manager with [facility] to request this. Waiting for call back.   Roddie McBryant Masako Overall MSW, [place]CSW, [place], [phone]

## 2016-08-31 NOTE — Consult Note (Signed)
[facility] Alanda Colton A. Gerilyn Pilgrim, MD     [email or web address]          Nichole [name] is an 71 y.o. female.   ASSESSMENT/PLAN:   UPDATE 08-31-16 She still is complaining also of good pain involving the right leg. EEG shows infrequent left temporal epileptiform activity. Given that the patient had only one questionable spell and the frequency of these on EEG, I will not treat her with antiseizure medication.        Acute on chronic gait impairment: Etiology unclear but pain is the major complaint and limiting factor. No structural, imaging or findings on physical examination to suggest a primary neurological problem. 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  An EEG will be suggested. Physical and occupational therapist suggested.   Significant cognitive impairment at baseline.      GENERAL: The patient is markedly overweight. She appears to be in significant discomfort from right knee and right ankle pain.  HEENT: Supple. Atraumatic normocephalic.   ABDOMEN: soft  EXTREMITIES: No edema. 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 There is marked soreness involving the right ankle and right knee. However, I appreciate no swelling. There is also no evidence of bruising or lacerations.  BACK: Normal.  SKIN: Normal by inspection.    MENTAL STATUS: She is awake and cooperative with evaluation. She knows that it is 2008 and January. She thinks that she is in a nursing home facility. She cannot provide adequate history as to her medical well-being however and why she is in the hospital.  CRANIAL NERVES: Pupils are equal, round and reactive to light and accommodation; extra ocular movements are full, there is no significant nystagmus; visual fields are full; upper and lower facial muscles are normal in strength and symmetric, there is no flattening of the nasolabial folds; tongue is  midline; uvula is midline; shoulder elevation is normal.  MOTOR: She has normal strength of the upper extremities 5/5. The right leg is quite painful and weak 2/5. The nurse reports that she was at least 3 earlier today before she reinjured her right leg. Left leg is 3/5.  COORDINATION: Left finger to nose is normal, right finger to nose is normal, No rest tremor; no intention tremor; no postural tremor; no bradykinesia.  SENSATION: Normal to light touch and to pain.             The brain MRI is reviewed in person. 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 It abuts the calcarine fissure. It is dark on DWI but has a other cord that is bright. This bright area is also bright on ADC although the ADC also has a central area of increased signal. This area also shows evidence of encephalomalacia on T1. This area is uniformly dark on SWI. There does not appear to be a clear acute ischemic or hemorrhagic event. The area seems to evolve normally compared to the acute blood seen on CT in 2016.  Blood pressure (!) 173/101, pulse 63, temperature 98 F (36.7 C), temperature source Oral, resp. rate 19, height 5\' 1"  (1.549 m), weight 171 lb 11.2 oz (77.9 kg), SpO2 100 %.  Past Medical History:  Diagnosis Date  . Anxiety   . Asthma   . Colon polyp   . COPD (chronic obstructive pulmonary disease) (HCC)   . Depression   . Diverticular disease   . Hyperlipidemia   .  Hypertension   . IFG (impaired fasting glucose)   . Memory loss   . Mild sleep apnea     Past Surgical History:  Procedure Laterality Date  . ABDOMINAL HYSTERECTOMY    . ABDOMINAL SURGERY    . ANTERIOR LAT LUMBAR FUSION Left 06/22/2014   Procedure: Left Lumbar one Corpectomy with Fusion Thoracic twelve-Lumbar two,lateral plating,posterior percutaneous pedicle screws;  Surgeon: Temple Pacini, MD;  Location: [facility];  Service: Neurosurgery;  Laterality: Left;  . APPENDECTOMY     . BACK SURGERY    . CERVICAL FUSION  October 2007  . LUMBAR PERCUTANEOUS PEDICLE SCREW 1 LEVEL N/A 06/22/2014   Procedure: LUMBAR PERCUTANEOUS PEDICLE SCREW 1 LEVEL;  Surgeon: Temple Pacini, MD;  Location: [facility];  Service: Neurosurgery;  Laterality: N/A;  . NECK SURGERY    . ORIF TIBIA FRACTURE Left 06/04/2014   Procedure: OPEN REDUCTION INTERNAL FIXATION (ORIF) PILON FRACTURE AND OPEN REDUCTION INTERNAL FIXATION (ORIF) OF SYNDESMOTIC FRACTURE;  Surgeon: Eldred Manges, MD;  Location: [facility];  Service: Orthopedics;  Laterality: Left;  Big C-arm, Flat Jackson, Biomet Anterolateral plates, cancellous bone graft 20cc. Surgeon will call. Available after 5pm  . ORIF TIBIA FRACTURE Left 06/14/2014   DR Gerlene Fee  . SIGMOIDECTOMY    . TONSILLECTOMY      Family History  Problem Relation Age of Onset  . Hypertension Father   . Diabetes Father   . Heart attack Father     Social History:  reports that she quit smoking about 22 years ago. Her smoking use included Cigarettes. She started smoking about 54 years ago. She has a 16.00 pack-year smoking history. She has never used smokeless tobacco. She reports that she does not drink alcohol or use drugs.  Allergies:  Allergies  Allergen Reactions  . Shellfish Allergy Anaphylaxis  . Iodine Other (See Comments)    Unknown   . Iohexol      Code: SOB, Desc: PT STATES SHE WENT INTO ANAPHYLACTIC SHOCK 42YRS AGO AFTER CT WITH CONTRAST AT [place], [place] Date: [id number]   . Morphine And Related Other (See Comments)    Unknown   . Penicillins Rash    Medications: Prior to Admission medications   Medication Sig Start Date End Date Taking? Authorizing Provider  albuterol (VENTOLIN HFA) 108 (90 Base) MCG/ACT inhaler INHALE 2 PUFFS EVERY 6 HOURS AS NEEDED FOR WHEEZING 07/02/16  Yes Merlyn Albert, MD  ALPRAZolam Prudy Feeler) 1 MG tablet TAKE 1/2 TABLET 3 TIMES DAILY AS NEEDED. Patient taking differently: TAKE 1/2 TABLET 3 TIMES DAILY AS NEEDED FOR  ANXIETY 01/15/16  Yes Merlyn Albert, MD  citalopram (CELEXA) 40 MG tablet TAKE (1) TABLET BY MOUTH EACH MORNING. 06/10/16  Yes Merlyn Albert, MD  diltiazem (CARDIZEM CD) 240 MG 24 hr capsule TAKE ONE CAPSULE BY MOUTH ONCE DAILY. 03/30/16  Yes Merlyn Albert, MD  donepezil (ARICEPT) 10 MG tablet Take 10 mg by mouth every morning.   Yes Historical Provider, MD  doxazosin (CARDURA) 2 MG tablet TAKE ONE TABLET BY MOUTH ONCE DAILY. 07/28/16  Yes Merlyn Albert, MD  hydrochlorothiazide (HYDRODIURIL) 25 MG tablet TAKE ONE TABLET BY MOUTH ONCE DAILY. 07/20/16  Yes Merlyn Albert, MD  HYDROcodone-acetaminophen (NORCO/VICODIN) 5-325 MG tablet TAKE (1) TABLET BY MOUTH TWICE DAILY AS NEEDED FOR MODERATE PAIN. 08/07/16  Yes Merlyn Albert, MD  meclizine (ANTIVERT) 25 MG tablet TAKE (1) TABLET BY MOUTH THREE TIMES DAILY AS NEEDED FOR DIZZINESS. 06/10/16  Yes Merlyn AlbertWilliam S Luking, MD  meloxicam (MOBIC) 7.5 MG tablet TAKE ONE TABLET BY MOUTH ONCE DAILY. 07/20/16  Yes Merlyn AlbertWilliam S Luking, MD  oxybutynin (DITROPAN-XL) 5 MG 24 hr tablet TAKE (1) TABLET BY MOUTH EACH MORNING. 07/20/16  Yes Merlyn AlbertWilliam S Luking, MD    Scheduled Meds: . citalopram  40 mg Oral Daily  . donepezil  10 mg Oral q morning - 10a  . fludrocortisone  0.1 mg Oral Daily  . oxybutynin  5 mg Oral QHS   Continuous Infusions: . sodium chloride 100 mL/hr at 08/31/16 0209   PRN Meds:.albuterol, ALPRAZolam, HYDROcodone-acetaminophen     No results found for this or any previous visit (from the past 48 hour(s)).  Studies/Results:     Lovell Roe A. Gerilyn Pilgrimoonquah, M.D.  Diplomate, Biomedical engineerAmerican Board of Psychiatry and Neurology ( Neurology). 08/31/2016, 7:21 PM

## 2016-08-31 NOTE — Progress Notes (Signed)
Patient to discharge to [facility] for SNF per social work report. Attempted to call report to receiving nurse, stated they will notify us if/when bed is available. Unsure if room will be available today at this time. Notified Social work. Earnstine RegalAshley Benaiah Behan, RN

## 2016-08-31 NOTE — Progress Notes (Signed)
EEG Completed; Results Pending  

## 2016-08-31 NOTE — Progress Notes (Signed)
Patient c/o persistent pain to right leg, rating pain 9/10 on 0-10 pain scale. Norco not due again until about 1400. Notified Dr. Ardyth HarpsHernandez. Stated okay to give dose now. Earnstine RegalAshley Kalana Yust, RN

## 2016-08-31 NOTE — Discharge Summary (Signed)
Physician Discharge Summary  [name]:[id number] DOB: 11-May-1946 DOA: 08/27/2016  PCP: Lubertha South, MD  Admit date: 08/27/2016 Discharge date: 08/31/2016  Time spent: 45 minutes  Recommendations for Outpatient Follow-up:  -Will be discharged to SNF today.   Discharge Diagnoses:  Active Problems:   Right leg weakness   Cerebral hemorrhage (HCC)   Orthostasis   Frequent falls   Discharge Condition: Stable and improved  Filed Weights   08/27/16 1609 08/28/16 0239  Weight: 81.6 kg (180 lb) 77.9 kg (171 lb 11.2 oz)    History of present illness:  As per Dr. Sharl [name] on 1/12: Nichole [name]  is a 71 y.o. female,With history of COPD, hypertension, hyperlipidemia who was brought to the hospital with right leg pain. Patient says that she noticed weakness in right lower extremity about 2 months ago, and she has been dragging her right leg since then. Yesterday he says that she fell, hitting head. She also says that there was loss of consciousness for a short time. In the ED CT head and cervical spine were done which showed Left posterior parasagittal focal hyperdensity measuring 1.5 cm with surrounding mild edema. No midline shift. Findings could relate to a small intraparenchymal bleed although a hyperdense or hemorrhagic mass is also included in the differential. MRI with and without contrast is recommended for further evaluation.  She denies slurred speech, no visual disturbance except one episode yesterday which led to her fall, no chest pain or shortness of breath. No previous history of seizures.  Patient to be admitted for further evaluation including MRI.  Hospital Course:   Intracerebral hemorrhage -Precipitated by fall. -Discussed via phone with neurosurgeon, Dr. Jordan Likes, with recommendations for no further inpatient or outpatient follow-up.  Frequent falls -Suspect orthostatic hypotension might be playing a role. -TSH and cortisol levels are within normal  limits. -Orthostatic parameters are improved with florinef. -B12 ok at 556... -She has been seen by physical therapy with recommendations for SNF; for which she is now agreeable. -Keep TED hose on.  Procedures:  None   Consultations:  Neurology  Discharge Instructions  Discharge Instructions    Diet - low sodium heart healthy    Complete by:  As directed    Increase activity slowly    Complete by:  As directed      Allergies as of 08/31/2016      Reactions   Shellfish Allergy Anaphylaxis   Iodine Other (See Comments)   Unknown    Iohexol     Code: SOB, Desc: PT STATES SHE WENT INTO ANAPHYLACTIC SHOCK 19YRS AGO AFTER CT WITH CONTRAST AT [place], [place] Date: [id number]   Morphine And Related Other (See Comments)   Unknown    Penicillins Rash      Medication List    STOP taking these medications   ALPRAZolam 1 MG tablet Commonly known as:  XANAX   diltiazem 240 MG 24 hr capsule Commonly known as:  CARDIZEM CD   doxazosin 2 MG tablet Commonly known as:  CARDURA   hydrochlorothiazide 25 MG tablet Commonly known as:  HYDRODIURIL   meloxicam 7.5 MG tablet Commonly known as:  MOBIC     TAKE these medications   albuterol 108 (90 Base) MCG/ACT inhaler Commonly known as:  VENTOLIN HFA INHALE 2 PUFFS EVERY 6 HOURS AS NEEDED FOR WHEEZING   citalopram 40 MG tablet Commonly known as:  CELEXA TAKE (1) TABLET BY MOUTH EACH MORNING.   donepezil 10 MG tablet Commonly known  as:  ARICEPT Take 10 mg by mouth every morning.   fludrocortisone 0.1 MG tablet Commonly known as:  FLORINEF Take 1 tablet (0.1 mg total) by mouth daily. Start taking on:  09/01/2016   HYDROcodone-acetaminophen 5-325 MG tablet Commonly known as:  NORCO/VICODIN TAKE (1) TABLET BY MOUTH TWICE DAILY AS NEEDED FOR MODERATE PAIN.   meclizine 25 MG tablet Commonly known as:  ANTIVERT TAKE (1) TABLET BY MOUTH THREE TIMES DAILY AS NEEDED FOR DIZZINESS.   oxybutynin 5 MG 24 hr tablet Commonly  known as:  DITROPAN-XL TAKE (1) TABLET BY MOUTH EACH MORNING.            Durable Medical Equipment        Start     Ordered   08/28/16 1546  For home use only DME wheelchair cushion (seat and back)  Once    Comments:  No back cushion, add ELR's and anti-tippers   08/28/16 1546     Allergies  Allergen Reactions  . Shellfish Allergy Anaphylaxis  . Iodine Other (See Comments)    Unknown   . Iohexol      Code: SOB, Desc: PT STATES SHE WENT INTO ANAPHYLACTIC SHOCK 8YRS AGO AFTER CT WITH CONTRAST AT [place], [place] Date: [id number]   . Morphine And Related Other (See Comments)    Unknown   . Penicillins Rash      The results of significant diagnostics from this hospitalization (including imaging, microbiology, ancillary and laboratory) are listed below for reference.    Significant Diagnostic Studies: Dg Lumbar Spine Complete  Result Date: 08/27/2016 CLINICAL DATA:  71 y/o F; lower back pain radiating to the right hip after a fall. EXAM: LUMBAR SPINE - COMPLETE 4+ VIEW COMPARISON:  09/26/2014 spinal radiographs FINDINGS: Postsurgical changes of the lumbar spine with a L1 vertebral body prosthesis, L2-3 and L4 through S1 interbody fusion, and a left lateral fusion plate, rod, and screws fixing the T12 through L3 vertebral bodies on the left. Hardware appears intact without apparent hardware related complication. Lumbar lordosis is maintained. No new loss of vertebral body height. Stable mild dextrocurvature. Aortic atherosclerosis with calcification. IMPRESSION: Fusion hardware as described without apparent hardware related complication or acute fracture. Electronically Signed   By: Mitzi Hansen M.D.   On: 08/27/2016 22:49   Dg Pelvis 1-2 Views  Result Date: 08/27/2016 CLINICAL DATA:  71 y/o F; back pain radiating to the right hip after fall. EXAM: PELVIS - 1-2 VIEW COMPARISON:  Concurrent lumbar spine radiographs. FINDINGS: There is no evidence of pelvic fracture or  diastasis. No pelvic bone lesions are seen. Lumbar fusion hardware degenerative changes are better characterized on concurrent lumbar spine radiographs. IMPRESSION: No acute fracture or dislocation identified. Electronically Signed   By: Mitzi Hansen M.D.   On: 08/27/2016 22:50   Ct Head Wo Contrast  Result Date: 08/27/2016 CLINICAL DATA:  Right leg pain decreased sensation to the bilateral lower extremities history of a fall EXAM: CT HEAD WITHOUT CONTRAST CT CERVICAL SPINE WITHOUT CONTRAST TECHNIQUE: Multidetector CT imaging of the head and cervical spine was performed following the standard protocol without intravenous contrast. Multiplanar CT image reconstructions of the cervical spine were also generated. COMPARISON:  05/29/2014, MRI 05/28/2015 FINDINGS: CT HEAD FINDINGS Brain: No large vessel territorial infarct is visualized. Along the left posterior parasagittal falx, at the junction of the parietal and occipital lobes, there is a focal hyperdensity measuring 1.4 by 1.2 by 1.5 cm with smaller focus of hyperattenuation slightly posterior and inferior.  There is mild surrounding low attenuation consistent with edema. No midline shift. Mild atrophy is present. Mild periventricular white matter small vessel disease. Vascular: No hyperdense vessels. Carotid artery calcifications are present. Skull: Mastoid air cells are clear.  No skull fracture is seen. Sinuses/Orbits: Mucosal thickening within the maxillary and ethmoid sinuses. No acute orbital abnormality. Other: None CT CERVICAL SPINE FINDINGS Alignment: Straightening of the cervical spine. Minimal anterolisthesis of C2 on C3, unchanged. Facet alignment is maintained. Skull base and vertebrae: Craniovertebral junction appears intact. There is no fracture identified. Patient is status post anterior plate and screw fixation of C3 through C6 with bony fusion present. Soft tissues and spinal canal: No prevertebral fluid or swelling. No visible canal  hematoma. Disc levels: Moderate degenerative disc changes at C6-C7. Moderate canal stenosis C5-C6. Multilevel foraminal stenosis, most notable at C5-C6. Upper chest: Lung apices clear.  Imaged thyroid unremarkable. Other: None IMPRESSION: 1. Left posterior parasagittal focal hyperdensity measuring 1.5 cm with surrounding mild edema. No midline shift. Findings could relate to a small intraparenchymal bleed although a hyperdense or hemorrhagic mass is also included in the differential. MRI with and without contrast is recommended for further evaluation. 2. Mild atrophy and mild periventricular white matter small vessel disease 3. Status post anterior surgical plate and screw fixation of C3 through C6. No acute osseous abnormality. Critical Value/emergent results were called by telephone at the time of interpretation on 08/27/2016 at 11:19 pm to Dr. Linwood DibblesJON KNAPP , who verbally acknowledged these results. Electronically Signed   By: Jasmine PangKim  Fujinaga M.D.   On: 08/27/2016 23:19   Ct Cervical Spine Wo Contrast  Result Date: 08/27/2016 CLINICAL DATA:  Right leg pain decreased sensation to the bilateral lower extremities history of a fall EXAM: CT HEAD WITHOUT CONTRAST CT CERVICAL SPINE WITHOUT CONTRAST TECHNIQUE: Multidetector CT imaging of the head and cervical spine was performed following the standard protocol without intravenous contrast. Multiplanar CT image reconstructions of the cervical spine were also generated. COMPARISON:  05/29/2014, MRI 05/28/2015 FINDINGS: CT HEAD FINDINGS Brain: No large vessel territorial infarct is visualized. Along the left posterior parasagittal falx, at the junction of the parietal and occipital lobes, there is a focal hyperdensity measuring 1.4 by 1.2 by 1.5 cm with smaller focus of hyperattenuation slightly posterior and inferior. There is mild surrounding low attenuation consistent with edema. No midline shift. Mild atrophy is present. Mild periventricular white matter small vessel  disease. Vascular: No hyperdense vessels. Carotid artery calcifications are present. Skull: Mastoid air cells are clear.  No skull fracture is seen. Sinuses/Orbits: Mucosal thickening within the maxillary and ethmoid sinuses. No acute orbital abnormality. Other: None CT CERVICAL SPINE FINDINGS Alignment: Straightening of the cervical spine. Minimal anterolisthesis of C2 on C3, unchanged. Facet alignment is maintained. Skull base and vertebrae: Craniovertebral junction appears intact. There is no fracture identified. Patient is status post anterior plate and screw fixation of C3 through C6 with bony fusion present. Soft tissues and spinal canal: No prevertebral fluid or swelling. No visible canal hematoma. Disc levels: Moderate degenerative disc changes at C6-C7. Moderate canal stenosis C5-C6. Multilevel foraminal stenosis, most notable at C5-C6. Upper chest: Lung apices clear.  Imaged thyroid unremarkable. Other: None IMPRESSION: 1. Left posterior parasagittal focal hyperdensity measuring 1.5 cm with surrounding mild edema. No midline shift. Findings could relate to a small intraparenchymal bleed although a hyperdense or hemorrhagic mass is also included in the differential. MRI with and without contrast is recommended for further evaluation. 2. Mild atrophy and mild periventricular  white matter small vessel disease 3. Status post anterior surgical plate and screw fixation of C3 through C6. No acute osseous abnormality. Critical Value/emergent results were called by telephone at the time of interpretation on 08/27/2016 at 11:19 pm to Dr. Linwood Dibbles , who verbally acknowledged these results. Electronically Signed   By: Jasmine Pang M.D.   On: 08/27/2016 23:19   Mr Brain Wo Contrast  Result Date: 08/28/2016 CLINICAL DATA:  Cerebral hemorrhage. Right leg pain extending to the foot starting 3 days ago. History of dementia. EXAM: MRI HEAD WITHOUT CONTRAST TECHNIQUE: Multiplanar, multiecho pulse sequences of the brain  and surrounding structures were obtained without intravenous contrast. COMPARISON:  05/28/2015 FINDINGS: Brain: Size stable hematoma in the left parasagittal occipital parietal cortex measuring up to 14 mm. The hemorrhage has T2 hypointensity and patchy T1 hyperintensity. Thin rim of surrounding edema without mass effect. The volume of edema is expected for the size of hematoma; no indication of underlying aggressive mass. Possible trace adjacent subdural component seen on FLAIR imaging. There is a rim of DWI hyperintensity, but this appears to be shine through and artifact. No acute hemorrhage or infarct elsewhere. No chronic hemorrhagic changes on gradient imaging to suggest amyloid angiopathy or other hemorrhagic diathesis. Mild periventricular chronic microvascular disease for age. Mild generalized volume loss. Vascular: Preserved flow voids including in the superior sagittal sinus along the hemorrhage. Skull and upper cervical spine: No focal marrow lesion. Artifact at the cervical spine from fusion hardware. Sinuses/Orbits: Negative IMPRESSION: 14 mm hematoma in the left posterior cerebral cortex is size stable from yesterday. Minimal edema without mass effect is also stable. No findings to confirm an underlying cause. No signs of amyloid angiopathy. Electronically Signed   By: Marnee Spring M.D.   On: 08/28/2016 09:07    Microbiology: No results found for this or any previous visit (from the past 240 hour(s)).   Labs: Basic Metabolic Panel:  Recent Labs Lab 08/27/16 2141 08/28/16 0614 08/29/16 0633  NA 135 136 141  K 2.5* 2.7* 3.9  CL 97* 100* 108  CO2 27 28 28   GLUCOSE 90 95 83  BUN 22* 19 14  CREATININE 0.76 0.54 0.48  CALCIUM 9.3 8.8* 8.5*  MG  --   --  2.0   Liver Function Tests:  Recent Labs Lab 08/28/16 0614  AST 34  ALT 29  ALKPHOS 88  BILITOT 1.4*  PROT 6.5  ALBUMIN 3.7   No results for input(s): LIPASE, AMYLASE in the last 168 hours. No results for input(s):  AMMONIA in the last 168 hours. CBC:  Recent Labs Lab 08/27/16 2141 08/28/16 0614 08/29/16 0633  WBC 9.3 7.7 6.0  NEUTROABS 6.7  --   --   HGB 14.8 13.5 13.0  HCT 43.7 40.1 39.1  MCV 85.5 85.7 86.7  PLT 360 352 341   Cardiac Enzymes: No results for input(s): CKTOTAL, CKMB, CKMBINDEX, TROPONINI in the last 168 hours. BNP: BNP (last 3 results) No results for input(s): BNP in the last 8760 hours.  ProBNP (last 3 results) No results for input(s): PROBNP in the last 8760 hours.  CBG: No results for input(s): GLUCAP in the last 168 hours.     SignedChaya Jan  [facility] Pager: [phone] 08/31/2016, 11:43 AM

## 2016-08-31 NOTE — Progress Notes (Signed)
Nursing received call from [facility] nurse stating they do not have a bed available tonight. Stated they were attempting to reach their administrator so he could speak with social worker here. Notified Dr. Ardyth HarpsHernandez. Received call from Roddie McBryant Campbell, CSW this evening. Stated unable to make arrangements tonight for discharge [facility] or [facility]. CSW to f/u tomorrow. Earnstine RegalAshley Leslyn Monda, RN
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
## 2016-08-31 NOTE — Clinical Social Work Placement (Signed)
   CLINICAL SOCIAL WORK PLACEMENT  NOTE  Date:  08/31/2016  Patient Details  Name: Nichole BankerVivian L Abdalla MRN: [id number] Date of Birth: November 10, 1945  Clinical Social Work is seeking post-discharge placement for this patient at the Skilled  Nursing Facility level of care (*CSW will initial, date and re-position this form in  chart as items are completed):  Yes   Patient/family provided with [place] Clinical Social Work Department's list of facilities offering this level of care within the geographic area requested by the patient (or if unable, by the patient's family).  Yes   Patient/family informed of their freedom to choose among providers that offer the needed level of care, that participate in Medicare, Medicaid or managed care program needed by the patient, have an available bed and are willing to accept the patient.  Yes   Patient/family informed of [facility]'s ownership interest in [facility] and [facility], as well as of the fact that they are under no obligation to receive care at these facilities.  PASRR submitted to EDS on       PASRR number received on       Existing PASRR number confirmed on 08/31/16     FL2 transmitted to all facilities in geographic area requested by pt/family on 08/31/16     FL2 transmitted to all facilities within larger geographic area on       Patient informed that his/her managed care company has contracts with or will negotiate with certain facilities, including the following:        Yes   Patient/family informed of bed offers received.  [facility]     Physician recommends and patient chooses bed at      Patient to be [facility] on 08/31/16.  Patient to be transferred to facility by Ambulance     Patient family notified on 08/31/16 of transfer.  Name of family member notified:  Natividad BroodEarl Harpole     PHYSICIAN Please prepare priority discharge summary, including medications, Please  prepare prescriptions, Please sign FL2     Additional Comment:   Per MD patient [facility] . RN, patient, patient's family, and facility notified of DC. RN given number for report. DC packet on chart. Ambulance transport will be requested by RN. Facility to call RN once patient's room is ready to notify RN when transport can be called. CSW signing off.  _______________________________________________ Venita Lickampbell, Sally-Ann Cutbirth B, LCSW 08/31/2016, 2:42 PM

## 2016-08-31 NOTE — Care Management Important Message (Signed)
Important Message  Patient Details  Name: Nichole [name] MRN: [id number] Date of Birth: 22-Oct-1945   Medicare Important Message Given:  Yes    [name], [name], RN 08/31/2016, 2:24 PM

## 2016-08-31 NOTE — Progress Notes (Signed)
Dr. Clearence PedSchorr notified of BP of 218/88. Patient c/o dizziness while resting in bed, worse when sitting and standing at bedside. Orthstatic BP checks in flow sheet. Hydralazine 10mg  IV given x1 dose per orders. Patient also c/o 10/10 right leg pain. Vicodin given PO per orders. Patient c/o nausea. Patient wants to wait and see if it improves, doesn't want medication for it at this time. Ginger-ale given. Will continue to monitor.

## 2016-08-31 NOTE — Procedures (Signed)
  [facility] Nichole Casselman A. Nichole Pilgrimoonquah, MD     [email or web address]           HISTORY: 71 YO with R leg weakness, numbness and LOC- ? SZ  MEDICATIONS: Scheduled Meds: . citalopram  40 mg Oral Daily  . donepezil  10 mg Oral q morning - 10a  . fludrocortisone  0.1 mg Oral Daily  . oxybutynin  5 mg Oral QHS   Continuous Infusions: . sodium chloride 100 mL/hr at 08/31/16 0209   PRN Meds:.albuterol, ALPRAZolam, HYDROcodone-acetaminophen  Prior to Admission medications   Medication Sig Start Date End Date Taking? Authorizing Provider  albuterol (VENTOLIN HFA) 108 (90 Base) MCG/ACT inhaler INHALE 2 PUFFS EVERY 6 HOURS AS NEEDED FOR WHEEZING 07/02/16  Yes Nichole AlbertWilliam S Luking, MD  ALPRAZolam Prudy Feeler(XANAX) 1 MG tablet TAKE 1/2 TABLET 3 TIMES DAILY AS NEEDED. Patient taking differently: TAKE 1/2 TABLET 3 TIMES DAILY AS NEEDED FOR ANXIETY 01/15/16  Yes Nichole AlbertWilliam S Luking, MD  citalopram (CELEXA) 40 MG tablet TAKE (1) TABLET BY MOUTH EACH MORNING. 06/10/16  Yes Nichole AlbertWilliam S Luking, MD  diltiazem (CARDIZEM CD) 240 MG 24 hr capsule TAKE ONE CAPSULE BY MOUTH ONCE DAILY. 03/30/16  Yes Nichole AlbertWilliam S Luking, MD  donepezil (ARICEPT) 10 MG tablet Take 10 mg by mouth every morning.   Yes Historical Provider, MD  doxazosin (CARDURA) 2 MG tablet TAKE ONE TABLET BY MOUTH ONCE DAILY. 07/28/16  Yes Nichole AlbertWilliam S Luking, MD  hydrochlorothiazide (HYDRODIURIL) 25 MG tablet TAKE ONE TABLET BY MOUTH ONCE DAILY. 07/20/16  Yes Nichole AlbertWilliam S Luking, MD  meclizine (ANTIVERT) 25 MG tablet TAKE (1) TABLET BY MOUTH THREE TIMES DAILY AS NEEDED FOR DIZZINESS. 06/10/16  Yes Nichole AlbertWilliam S Luking, MD  meloxicam (MOBIC) 7.5 MG tablet TAKE ONE TABLET BY MOUTH ONCE DAILY. 07/20/16  Yes Nichole AlbertWilliam S Luking, MD  oxybutynin (DITROPAN-XL) 5 MG 24 hr tablet TAKE (1) TABLET BY MOUTH EACH MORNING. 07/20/16  Yes Nichole AlbertWilliam S Luking, MD  fludrocortisone (FLORINEF) 0.1 MG tablet Take 1 tablet (0.1 mg total) by mouth daily. 09/01/16   Nichole CloudEstela Y Hernandez Acosta, MD    HYDROcodone-acetaminophen (NORCO/VICODIN) 5-325 MG tablet TAKE (1) TABLET BY MOUTH TWICE DAILY AS NEEDED FOR MODERATE PAIN. 08/31/16   Nichole CloudEstela Y Hernandez Acosta, MD      ANALYSIS: A 16 channel recording using standard 10 20 measurements is conducted for 22 minutes. A 16 channel recording using standard 10/20 measurements is conducted for  minutes. There is a dominant posterior rhythm of 8 Hz which attenuates with eye opening. There is beta activity seen over the frontal fields. Awake and sleep activities are seen. Stage two NREM sleep with K complexes and spindles is seen. Photic stimulation and hyperventilation did not elicit any abnormal responses. There is no focal slowing or lateralized slowing. There are about 3 episodes of sharp wave activity with phase reversal at T3.    IMPRESSION: This recording is abnormal due the following: 1. Infrequent L temporal epileptiform activity. 2. MIld global slowing.       Nichole [name] A. Nichole [name], M.D.  Diplomate, Biomedical engineerAmerican Board of Psychiatry and Neurology ( Neurology).

## 2016-08-31 NOTE — Clinical Social Work Note (Signed)
[employer] auth received for the patient. Auth number is [id number]. Erich Montanehris Dudley with [facility] updated.    Roddie McBryant Sahej Schrieber MSW, [place]CSW, [place], [phone]

## 2016-08-31 NOTE — Clinical Social Work Note (Signed)
CSW was notified that patient is ready for DC today. Patient is agreeable to SNF placement wherever bed is available. Referrals have been made. Waiting for offers. Patient's husband Natividad Broodarl Querry has been updated.  Roddie McBryant Kenli Waldo MSW, [place]CSW, [place], [phone]

## 2016-09-01 DIAGNOSIS — I10 Essential (primary) hypertension: Secondary | ICD-10-CM | POA: Diagnosis not present

## 2016-09-01 DIAGNOSIS — I951 Orthostatic hypotension: Secondary | ICD-10-CM | POA: Diagnosis not present

## 2016-09-01 DIAGNOSIS — J441 Chronic obstructive pulmonary disease with (acute) exacerbation: Secondary | ICD-10-CM | POA: Diagnosis not present

## 2016-09-01 DIAGNOSIS — G300 Alzheimer's disease with early onset: Secondary | ICD-10-CM | POA: Diagnosis not present

## 2016-09-01 DIAGNOSIS — R2681 Unsteadiness on feet: Secondary | ICD-10-CM | POA: Diagnosis not present

## 2016-09-01 DIAGNOSIS — R279 Unspecified lack of coordination: Secondary | ICD-10-CM | POA: Diagnosis not present

## 2016-09-01 DIAGNOSIS — I619 Nontraumatic intracerebral hemorrhage, unspecified: Secondary | ICD-10-CM | POA: Diagnosis not present

## 2016-09-01 DIAGNOSIS — N39498 Other specified urinary incontinence: Secondary | ICD-10-CM | POA: Diagnosis not present

## 2016-09-01 DIAGNOSIS — Z743 Need for continuous supervision: Secondary | ICD-10-CM | POA: Diagnosis not present

## 2016-09-01 DIAGNOSIS — R262 Difficulty in walking, not elsewhere classified: Secondary | ICD-10-CM | POA: Diagnosis not present

## 2016-09-01 DIAGNOSIS — M6281 Muscle weakness (generalized): Secondary | ICD-10-CM | POA: Diagnosis not present

## 2016-09-01 DIAGNOSIS — G894 Chronic pain syndrome: Secondary | ICD-10-CM | POA: Diagnosis not present

## 2016-09-01 DIAGNOSIS — I6789 Other cerebrovascular disease: Secondary | ICD-10-CM | POA: Diagnosis not present

## 2016-09-01 DIAGNOSIS — R296 Repeated falls: Secondary | ICD-10-CM | POA: Diagnosis not present

## 2016-09-01 DIAGNOSIS — F330 Major depressive disorder, recurrent, mild: Secondary | ICD-10-CM | POA: Diagnosis not present

## 2016-09-01 DIAGNOSIS — R2689 Other abnormalities of gait and mobility: Secondary | ICD-10-CM | POA: Diagnosis not present

## 2016-09-01 DIAGNOSIS — E785 Hyperlipidemia, unspecified: Secondary | ICD-10-CM | POA: Diagnosis not present

## 2016-09-01 DIAGNOSIS — F0390 Unspecified dementia without behavioral disturbance: Secondary | ICD-10-CM | POA: Diagnosis not present

## 2016-09-01 DIAGNOSIS — F0280 Dementia in other diseases classified elsewhere without behavioral disturbance: Secondary | ICD-10-CM | POA: Diagnosis not present

## 2016-09-01 MED ORDER — HYDRALAZINE HCL 20 MG/ML IJ SOLN
10.0000 mg | Freq: Once | INTRAMUSCULAR | Status: AC
  Administered 2016-09-01: 10 mg via INTRAVENOUS
  Filled 2016-09-01: qty 1

## 2016-09-01 NOTE — Progress Notes (Signed)
Dr. Clearence PedSchorr paged for notification of BP 195/72, HR 63. This is approximately 2 hours after Hydralazine given Iv. Patient's pain has improved, 8/10 at this time. Patient still c/o dizziness while resting in bed, but states that this has been occurring for 4-5 days now. Awaiting for response.

## 2016-09-01 NOTE — Clinical Social Work Note (Signed)
CSW spoke with patient's husband and advised that patient would be discharging today. He instructed CSW to contact [facility] for transport.  CSW spoke with Nichole [name] and advised that patient was discharging today. He stated that room was ready for patient.  CSW arranged transportation.    CSW signing off.      Derick Seminara, Juleen ChinaHeather D, LCSW

## 2016-09-01 NOTE — Progress Notes (Signed)
Patient with orders to be discharge. Discharge instructions sent with [facility] to [facility] [place]. Patient stable. Patient transported via EMS.

## 2016-09-03 DIAGNOSIS — I6789 Other cerebrovascular disease: Secondary | ICD-10-CM | POA: Diagnosis not present

## 2016-09-03 DIAGNOSIS — N39498 Other specified urinary incontinence: Secondary | ICD-10-CM | POA: Diagnosis not present

## 2016-09-03 DIAGNOSIS — J441 Chronic obstructive pulmonary disease with (acute) exacerbation: Secondary | ICD-10-CM | POA: Diagnosis not present

## 2016-09-03 DIAGNOSIS — F0280 Dementia in other diseases classified elsewhere without behavioral disturbance: Secondary | ICD-10-CM | POA: Diagnosis not present

## 2016-09-17 DIAGNOSIS — E785 Hyperlipidemia, unspecified: Secondary | ICD-10-CM | POA: Diagnosis not present

## 2016-09-17 DIAGNOSIS — R296 Repeated falls: Secondary | ICD-10-CM | POA: Diagnosis not present

## 2016-09-17 DIAGNOSIS — F330 Major depressive disorder, recurrent, mild: Secondary | ICD-10-CM | POA: Diagnosis not present

## 2016-09-17 DIAGNOSIS — G300 Alzheimer's disease with early onset: Secondary | ICD-10-CM | POA: Diagnosis not present

## 2016-09-17 DIAGNOSIS — G894 Chronic pain syndrome: Secondary | ICD-10-CM | POA: Diagnosis not present

## 2016-09-17 DIAGNOSIS — R262 Difficulty in walking, not elsewhere classified: Secondary | ICD-10-CM | POA: Diagnosis not present

## 2016-09-17 DIAGNOSIS — I951 Orthostatic hypotension: Secondary | ICD-10-CM | POA: Diagnosis not present

## 2016-09-17 DIAGNOSIS — I619 Nontraumatic intracerebral hemorrhage, unspecified: Secondary | ICD-10-CM | POA: Diagnosis not present

## 2016-09-17 DIAGNOSIS — R2681 Unsteadiness on feet: Secondary | ICD-10-CM | POA: Diagnosis not present

## 2016-09-17 DIAGNOSIS — R2689 Other abnormalities of gait and mobility: Secondary | ICD-10-CM | POA: Diagnosis not present

## 2016-09-17 DIAGNOSIS — M6281 Muscle weakness (generalized): Secondary | ICD-10-CM | POA: Diagnosis not present

## 2016-09-17 DIAGNOSIS — I10 Essential (primary) hypertension: Secondary | ICD-10-CM | POA: Diagnosis not present

## 2016-09-17 DIAGNOSIS — J441 Chronic obstructive pulmonary disease with (acute) exacerbation: Secondary | ICD-10-CM | POA: Diagnosis not present

## 2016-09-17 DIAGNOSIS — F0390 Unspecified dementia without behavioral disturbance: Secondary | ICD-10-CM | POA: Diagnosis not present

## 2016-09-23 DIAGNOSIS — M6281 Muscle weakness (generalized): Secondary | ICD-10-CM | POA: Diagnosis not present

## 2016-09-23 DIAGNOSIS — I10 Essential (primary) hypertension: Secondary | ICD-10-CM | POA: Diagnosis not present

## 2016-09-23 DIAGNOSIS — G309 Alzheimer's disease, unspecified: Secondary | ICD-10-CM | POA: Diagnosis not present

## 2016-09-23 DIAGNOSIS — F329 Major depressive disorder, single episode, unspecified: Secondary | ICD-10-CM | POA: Diagnosis not present

## 2016-09-23 DIAGNOSIS — Z8679 Personal history of other diseases of the circulatory system: Secondary | ICD-10-CM | POA: Diagnosis not present

## 2016-09-23 DIAGNOSIS — F0280 Dementia in other diseases classified elsewhere without behavioral disturbance: Secondary | ICD-10-CM | POA: Diagnosis not present

## 2016-09-23 DIAGNOSIS — J441 Chronic obstructive pulmonary disease with (acute) exacerbation: Secondary | ICD-10-CM | POA: Diagnosis not present

## 2016-09-23 DIAGNOSIS — Z9181 History of falling: Secondary | ICD-10-CM | POA: Diagnosis not present

## 2016-09-23 DIAGNOSIS — G894 Chronic pain syndrome: Secondary | ICD-10-CM | POA: Diagnosis not present

## 2016-09-24 ENCOUNTER — Encounter: Payer: Self-pay | Admitting: Family Medicine

## 2016-09-24 ENCOUNTER — Ambulatory Visit (INDEPENDENT_AMBULATORY_CARE_PROVIDER_SITE_OTHER): Payer: PPO | Admitting: Family Medicine

## 2016-09-24 VITALS — Ht 61.0 in | Wt 180.0 lb

## 2016-09-24 DIAGNOSIS — F0391 Unspecified dementia with behavioral disturbance: Secondary | ICD-10-CM | POA: Diagnosis not present

## 2016-09-24 DIAGNOSIS — I10 Essential (primary) hypertension: Secondary | ICD-10-CM | POA: Diagnosis not present

## 2016-09-24 DIAGNOSIS — F03918 Unspecified dementia, unspecified severity, with other behavioral disturbance: Secondary | ICD-10-CM

## 2016-09-24 DIAGNOSIS — M6281 Muscle weakness (generalized): Secondary | ICD-10-CM | POA: Diagnosis not present

## 2016-09-24 DIAGNOSIS — I619 Nontraumatic intracerebral hemorrhage, unspecified: Secondary | ICD-10-CM

## 2016-09-24 DIAGNOSIS — R296 Repeated falls: Secondary | ICD-10-CM | POA: Diagnosis not present

## 2016-09-24 DIAGNOSIS — J441 Chronic obstructive pulmonary disease with (acute) exacerbation: Secondary | ICD-10-CM | POA: Diagnosis not present

## 2016-09-24 NOTE — Progress Notes (Signed)
   Subjective:    Patient ID: Nichole [name], female    DOB: Oct 03, 1945, 71 y.o.   MRN: [id number] Seen within 48 hours of discharge from the skilled nursing facility. HPI entire hospital record reviewed at length. Along with imaging results blood work specialty reports progress notes discharge summary etc.  Patient arrives from a follow up from recent hospitalization and [facility] for muscle weakness next  Patient reports ongoing challenges with leg pain. Worse with certain motions. Next  Also ongoing challenges with bilateral leg unsteadiness and weakness which is chronic along with new onset of right leg weakness superimposed on this latest hospitalization.  Patient did experience what appears to be a hemorrhagic stroke. Next  Patient did have elevated blood pressure within the hospital. Then curiously low blood pressure at times augmented with Florinef.  Comes in today just 2 days after discharge from the skilled nursing facility  Review of Systems No chest pain no shortness of breath no abdominal pain no change in bowel habits ROS otherwise negative    Objective:   Physical Exam Alert pleasant no acute distress oriented 2 lungs clear heart rare rhythm sit in a wheelchair right hip some pain with rotation strength diminished       Assessment & Plan:  Impression 1 status post hemorrhagic stroke equivalent there was some disagreement among specialist as to just what this represented with some residual right leg weakness #2 chronic musculoskeletal pain from old low back injury and joint challenges #3 right leg weakness from both #1 and #2 along with #4. #4 probable Lewy body disease with dementia and hallucinations and neuromuscular features #5 hypertension good control today #6 social aspects quite challenging positive and feels overwhelmed at times. But did not want to keep wife in nursing home due to high cost. Follow-up in 6 weeks. Continue home physical  therapy [facility]

## 2016-09-25 DIAGNOSIS — I10 Essential (primary) hypertension: Secondary | ICD-10-CM | POA: Diagnosis not present

## 2016-09-25 DIAGNOSIS — Z9181 History of falling: Secondary | ICD-10-CM | POA: Diagnosis not present

## 2016-09-25 DIAGNOSIS — M6281 Muscle weakness (generalized): Secondary | ICD-10-CM | POA: Diagnosis not present

## 2016-09-25 DIAGNOSIS — F0280 Dementia in other diseases classified elsewhere without behavioral disturbance: Secondary | ICD-10-CM | POA: Diagnosis not present

## 2016-09-25 DIAGNOSIS — G309 Alzheimer's disease, unspecified: Secondary | ICD-10-CM | POA: Diagnosis not present

## 2016-09-25 DIAGNOSIS — G894 Chronic pain syndrome: Secondary | ICD-10-CM | POA: Diagnosis not present

## 2016-09-25 DIAGNOSIS — J441 Chronic obstructive pulmonary disease with (acute) exacerbation: Secondary | ICD-10-CM | POA: Diagnosis not present

## 2016-09-25 DIAGNOSIS — F329 Major depressive disorder, single episode, unspecified: Secondary | ICD-10-CM | POA: Diagnosis not present

## 2016-09-25 DIAGNOSIS — Z8679 Personal history of other diseases of the circulatory system: Secondary | ICD-10-CM | POA: Diagnosis not present

## 2016-10-07 ENCOUNTER — Telehealth: Payer: Self-pay | Admitting: Family Medicine

## 2016-10-07 NOTE — Telephone Encounter (Signed)
Received fax from [employer] stating that Home Health was approved with [facility] from 09/18/2016-11/17/2016.  The procedures that were approved were Physical Therapy, and Occupational Therapy. Fax being forwarded to Charles SchwabDr.Steve Luking.

## 2016-10-08 DIAGNOSIS — J441 Chronic obstructive pulmonary disease with (acute) exacerbation: Secondary | ICD-10-CM | POA: Diagnosis not present

## 2016-10-08 DIAGNOSIS — G894 Chronic pain syndrome: Secondary | ICD-10-CM | POA: Diagnosis not present

## 2016-10-08 DIAGNOSIS — Z9181 History of falling: Secondary | ICD-10-CM | POA: Diagnosis not present

## 2016-10-08 DIAGNOSIS — Z8679 Personal history of other diseases of the circulatory system: Secondary | ICD-10-CM | POA: Diagnosis not present

## 2016-10-08 DIAGNOSIS — M6281 Muscle weakness (generalized): Secondary | ICD-10-CM | POA: Diagnosis not present

## 2016-10-08 DIAGNOSIS — I10 Essential (primary) hypertension: Secondary | ICD-10-CM | POA: Diagnosis not present

## 2016-10-08 DIAGNOSIS — G309 Alzheimer's disease, unspecified: Secondary | ICD-10-CM | POA: Diagnosis not present

## 2016-10-08 DIAGNOSIS — F329 Major depressive disorder, single episode, unspecified: Secondary | ICD-10-CM | POA: Diagnosis not present

## 2016-10-08 DIAGNOSIS — F0280 Dementia in other diseases classified elsewhere without behavioral disturbance: Secondary | ICD-10-CM | POA: Diagnosis not present

## 2016-10-10 ENCOUNTER — Other Ambulatory Visit: Payer: Self-pay | Admitting: Family Medicine

## 2016-10-16 DIAGNOSIS — Z9181 History of falling: Secondary | ICD-10-CM | POA: Diagnosis not present

## 2016-10-16 DIAGNOSIS — F0280 Dementia in other diseases classified elsewhere without behavioral disturbance: Secondary | ICD-10-CM | POA: Diagnosis not present

## 2016-10-16 DIAGNOSIS — Z8679 Personal history of other diseases of the circulatory system: Secondary | ICD-10-CM | POA: Diagnosis not present

## 2016-10-16 DIAGNOSIS — F329 Major depressive disorder, single episode, unspecified: Secondary | ICD-10-CM | POA: Diagnosis not present

## 2016-10-16 DIAGNOSIS — M6281 Muscle weakness (generalized): Secondary | ICD-10-CM | POA: Diagnosis not present

## 2016-10-16 DIAGNOSIS — I10 Essential (primary) hypertension: Secondary | ICD-10-CM | POA: Diagnosis not present

## 2016-10-16 DIAGNOSIS — G309 Alzheimer's disease, unspecified: Secondary | ICD-10-CM | POA: Diagnosis not present

## 2016-10-16 DIAGNOSIS — J441 Chronic obstructive pulmonary disease with (acute) exacerbation: Secondary | ICD-10-CM | POA: Diagnosis not present

## 2016-10-16 DIAGNOSIS — G894 Chronic pain syndrome: Secondary | ICD-10-CM | POA: Diagnosis not present

## 2016-10-26 ENCOUNTER — Other Ambulatory Visit: Payer: Self-pay | Admitting: Family Medicine

## 2016-11-02 ENCOUNTER — Ambulatory Visit: Payer: Medicare Other | Admitting: Family Medicine

## 2016-11-04 ENCOUNTER — Ambulatory Visit: Payer: PPO | Admitting: Family Medicine

## 2016-11-05 DIAGNOSIS — M6281 Muscle weakness (generalized): Secondary | ICD-10-CM | POA: Diagnosis not present

## 2016-11-09 ENCOUNTER — Encounter: Payer: Self-pay | Admitting: Family Medicine

## 2016-11-09 ENCOUNTER — Ambulatory Visit (INDEPENDENT_AMBULATORY_CARE_PROVIDER_SITE_OTHER): Payer: PPO | Admitting: Family Medicine

## 2016-11-09 VITALS — BP 126/70 | Ht 61.0 in | Wt 164.0 lb

## 2016-11-09 DIAGNOSIS — G894 Chronic pain syndrome: Secondary | ICD-10-CM

## 2016-11-09 DIAGNOSIS — J441 Chronic obstructive pulmonary disease with (acute) exacerbation: Secondary | ICD-10-CM

## 2016-11-09 DIAGNOSIS — I619 Nontraumatic intracerebral hemorrhage, unspecified: Secondary | ICD-10-CM

## 2016-11-09 DIAGNOSIS — F0391 Unspecified dementia with behavioral disturbance: Secondary | ICD-10-CM

## 2016-11-09 DIAGNOSIS — I10 Essential (primary) hypertension: Secondary | ICD-10-CM | POA: Diagnosis not present

## 2016-11-09 DIAGNOSIS — F03918 Unspecified dementia, unspecified severity, with other behavioral disturbance: Secondary | ICD-10-CM

## 2016-11-09 MED ORDER — CEPHALEXIN 500 MG PO CAPS: ORAL_CAPSULE | ORAL | 0 refills | Status: DC

## 2016-11-09 NOTE — Progress Notes (Signed)
   Subjective:    Patient ID: Nichole [name], female    DOB: 1946-02-02, 71 y.o.   MRN: [id number]  HPI Patient in today for 6 week follow up for cerebral Hemorrhage.   Has concerns of recent colds and infection, Has had a cough. About 2 weeks. Productive at times.  Continues to strengthen up somewhat from stroke. Using Spina more easily. Of note family declined nursing home placement.  Patient spouse reports ongoing challenges with mentation. More more stressful to the spouse. Frustrated because he is not getting good family support.  Blood pressure medicine and blood pressure levels reviewed today with patient. Compliant with blood pressure medicine. States does not miss a dose. No obvious side effects. Blood pressure generally good when checked elsewhere. Watching salt intake.   Review of Systems No headache, no major weight loss or weight gain, no chest pain no back pain abdominal pain no change in bowel habits complete ROS otherwise negative     Objective:   Physical Exam Alert not oriented. Oriented 1 only. H&T ears and throat within normal limits normal pharynx normal external canal normal tympanic membrane no lymphadenopathy moderate his congestion lungs occasional bronchial cough no wheezes no crackles no tachypnea heart regular rate, carotid pulses strong and intact. and rhythm. Obesity present. Bonus present. Ankles trace edema. Sensation intact       Assessment & Plan:  Impression 1 ongoing challenges with dementia and discussed at length. Once again extremely long discussion held. Patient spouse frustrated. He states not suicidal or homicidal but just frustrated and feeling worn down. Another frank discussion held on potential need for nursing home at one point. Both patient and spouse expresses understanding. Further discussion on potential options. Discussed the [facility] through the senior center. Potential benefits discussed. #2 subacute bronchitis discussed #3  chronic pain ongoing discussed #4 status post stroke slow improvement per family #5 hypertension good control discussed maintain same meds #60 depression clinically stable discussed maintain same plan antibiotics prescribed. Symptom care discussed. Maintain same medications. Encouraged patient's spouse to contact the senior center and explored daily respite opportunities  Greater than 50% of this 40 minute face to face visit was spent in counseling and discussion and coordination of care regarding the above diagnosis/diagnosies

## 2016-11-11 DIAGNOSIS — J441 Chronic obstructive pulmonary disease with (acute) exacerbation: Secondary | ICD-10-CM | POA: Diagnosis not present

## 2016-11-11 DIAGNOSIS — F329 Major depressive disorder, single episode, unspecified: Secondary | ICD-10-CM | POA: Diagnosis not present

## 2016-11-11 DIAGNOSIS — M6281 Muscle weakness (generalized): Secondary | ICD-10-CM | POA: Diagnosis not present

## 2016-11-11 DIAGNOSIS — I10 Essential (primary) hypertension: Secondary | ICD-10-CM | POA: Diagnosis not present

## 2016-11-11 DIAGNOSIS — Z8679 Personal history of other diseases of the circulatory system: Secondary | ICD-10-CM | POA: Diagnosis not present

## 2016-11-11 DIAGNOSIS — F0280 Dementia in other diseases classified elsewhere without behavioral disturbance: Secondary | ICD-10-CM | POA: Diagnosis not present

## 2016-11-11 DIAGNOSIS — G894 Chronic pain syndrome: Secondary | ICD-10-CM | POA: Diagnosis not present

## 2016-11-11 DIAGNOSIS — G309 Alzheimer's disease, unspecified: Secondary | ICD-10-CM | POA: Diagnosis not present

## 2016-11-11 DIAGNOSIS — Z9181 History of falling: Secondary | ICD-10-CM | POA: Diagnosis not present

## 2016-11-17 ENCOUNTER — Telehealth: Payer: Self-pay | Admitting: Family Medicine

## 2016-11-17 ENCOUNTER — Other Ambulatory Visit: Payer: Self-pay | Admitting: *Deleted

## 2016-11-17 MED ORDER — OXYBUTYNIN CHLORIDE ER 5 MG PO TB24: ORAL_TABLET | ORAL | 2 refills | Status: DC

## 2016-11-17 NOTE — Telephone Encounter (Signed)
Ditropan 5xl numb 30 one qam plus wo ref

## 2016-11-17 NOTE — Telephone Encounter (Signed)
Frequent urination for about 2 months, no pain with urination, burns and itches after urination, no fever, no abd pain, no unusual back pain. She has had back pain for years. No blood in urine.

## 2016-11-17 NOTE — Telephone Encounter (Signed)
Med sent to pharm. Pt notified.  

## 2016-11-17 NOTE — Telephone Encounter (Signed)
Patient said she has dicussed her bladder issues with Dr. Brett Canales before and she was told she may need to be prescribed something for her bladder.  She said she is in the bathroom 10-15 times a day and would like Rx for this.  Robbie Lis

## 2016-11-18 ENCOUNTER — Other Ambulatory Visit: Payer: Self-pay | Admitting: Family Medicine

## 2016-12-06 DIAGNOSIS — M6281 Muscle weakness (generalized): Secondary | ICD-10-CM | POA: Diagnosis not present

## 2016-12-07 ENCOUNTER — Other Ambulatory Visit: Payer: Self-pay | Admitting: Family Medicine

## 2016-12-14 ENCOUNTER — Other Ambulatory Visit: Payer: Self-pay | Admitting: Family Medicine

## 2016-12-21 ENCOUNTER — Other Ambulatory Visit: Payer: Self-pay | Admitting: Family Medicine

## 2016-12-23 ENCOUNTER — Telehealth: Payer: Self-pay | Admitting: Family Medicine

## 2016-12-23 NOTE — Telephone Encounter (Signed)
Telephone call no answer-voicemail box not set up 

## 2016-12-23 NOTE — Telephone Encounter (Signed)
Needs note for jury duty stating she is physically and mentally unable to perform this duty  Patient also needs another letter similar to the letter from 09/2015 but needs to include about recent stroke and paralysis- needs to make sure both letters are signed.

## 2016-12-23 NOTE — Telephone Encounter (Signed)
Pt's husband is needing a letter written stating that she is not able to do jury duty. Husband is needing an additional letter stating what all is wrong with her physically and mentally for legal reasons. Pt is wanting to talk to dr Brett Canalessteve personally about this on the phone if possible. Please advise.

## 2016-12-23 NOTE — Telephone Encounter (Signed)
ntsw and take careful notes, if husb insit on disc will need to come in, does not have to have pt with him

## 2016-12-28 NOTE — Telephone Encounter (Signed)
Patients spouse called to check on this letter.

## 2017-01-01 NOTE — Telephone Encounter (Signed)
Left message to return call 

## 2017-01-01 NOTE — Telephone Encounter (Signed)
Ready mon am

## 2017-01-01 NOTE — Telephone Encounter (Signed)
Patients spouse called to check on this letter.  He is needing this ASAP because he says he is running out of time.

## 2017-01-01 NOTE — Telephone Encounter (Signed)
Patient's husband notified  

## 2017-01-01 NOTE — Telephone Encounter (Signed)
Telephone call no answer-voicemail box not set up 

## 2017-01-05 DIAGNOSIS — M6281 Muscle weakness (generalized): Secondary | ICD-10-CM | POA: Diagnosis not present

## 2017-01-09 ENCOUNTER — Other Ambulatory Visit: Payer: Self-pay | Admitting: Family Medicine

## 2017-01-12 ENCOUNTER — Other Ambulatory Visit: Payer: Self-pay | Admitting: Family Medicine

## 2017-01-12 NOTE — Telephone Encounter (Signed)
Last seen 11/09/16

## 2017-01-21 ENCOUNTER — Other Ambulatory Visit: Payer: Self-pay | Admitting: Family Medicine

## 2017-01-21 ENCOUNTER — Other Ambulatory Visit: Payer: Self-pay | Admitting: *Deleted

## 2017-01-21 MED ORDER — HYDROCHLOROTHIAZIDE 25 MG PO TABS: 25.0000 mg | ORAL_TABLET | Freq: Every day | ORAL | 5 refills | Status: DC

## 2017-01-21 MED ORDER — MELOXICAM 7.5 MG PO TABS: 7.5000 mg | ORAL_TABLET | Freq: Every day | ORAL | 5 refills | Status: DC

## 2017-01-21 NOTE — Telephone Encounter (Signed)
Ok for meloxiam and hctz for 11 ref, how often getting hydrocodone and how much last time and when?

## 2017-01-25 ENCOUNTER — Other Ambulatory Visit: Payer: Self-pay | Admitting: Family Medicine

## 2017-01-25 NOTE — Telephone Encounter (Signed)
Last seen 11/09/16

## 2017-01-26 ENCOUNTER — Telehealth: Payer: Self-pay | Admitting: Family Medicine

## 2017-01-26 MED ORDER — HYDROCODONE-ACETAMINOPHEN 5-325 MG PO TABS: ORAL_TABLET | ORAL | 0 refills | Status: DC

## 2017-01-26 NOTE — Telephone Encounter (Signed)
Pt is completely out and has an appt scheduled for next Friday.

## 2017-01-26 NOTE — Telephone Encounter (Signed)
Husband came in to check on pt's hydrocodone refill. Please advise.

## 2017-01-26 NOTE — Telephone Encounter (Signed)
One mo worth 

## 2017-01-26 NOTE — Telephone Encounter (Signed)
Spoke with patient's husband and informed him per Dr.Steve Luking- Patient may have 1 month worth. Patient's husband verbalized understanding.

## 2017-02-04 ENCOUNTER — Ambulatory Visit: Payer: PPO | Admitting: Family Medicine

## 2017-02-05 ENCOUNTER — Encounter: Payer: Self-pay | Admitting: Family Medicine

## 2017-02-05 ENCOUNTER — Ambulatory Visit (INDEPENDENT_AMBULATORY_CARE_PROVIDER_SITE_OTHER): Payer: PPO | Admitting: Family Medicine

## 2017-02-05 VITALS — BP 148/72 | Ht 61.0 in | Wt 170.5 lb

## 2017-02-05 DIAGNOSIS — I10 Essential (primary) hypertension: Secondary | ICD-10-CM

## 2017-02-05 DIAGNOSIS — F0391 Unspecified dementia with behavioral disturbance: Secondary | ICD-10-CM | POA: Diagnosis not present

## 2017-02-05 DIAGNOSIS — R2681 Unsteadiness on feet: Secondary | ICD-10-CM

## 2017-02-05 DIAGNOSIS — G894 Chronic pain syndrome: Secondary | ICD-10-CM

## 2017-02-05 DIAGNOSIS — F03918 Unspecified dementia, unspecified severity, with other behavioral disturbance: Secondary | ICD-10-CM

## 2017-02-05 DIAGNOSIS — M6281 Muscle weakness (generalized): Secondary | ICD-10-CM | POA: Diagnosis not present

## 2017-02-05 MED ORDER — HYDROCODONE-ACETAMINOPHEN 5-325 MG PO TABS: ORAL_TABLET | ORAL | 0 refills | Status: DC

## 2017-02-05 MED ORDER — HYDROCODONE-ACETAMINOPHEN 5-325 MG PO TABS
ORAL_TABLET | ORAL | 0 refills | Status: DC
Start: 2017-02-05 — End: 2017-02-05

## 2017-02-05 MED ORDER — OXYBUTYNIN CHLORIDE ER 10 MG PO TB24: 10.0000 mg | ORAL_TABLET | Freq: Every day | ORAL | 1 refills | Status: DC

## 2017-02-05 NOTE — Progress Notes (Signed)
   Subjective:    Patient ID: Nichole [name], female    DOB: [birth year], 71 y.o.   MRN: [id number]  Hypertension  This is a chronic problem. The current episode started more than 1 year ago. There are no compliance problems.   doesn't always eat the best  Not exercising  Sticking with meds  Blood pressure medicine and blood pressure levels reviewed today with patient. Compliant with blood pressure medicine. States does not miss a dose. No obvious side effects. Blood pressure generally good when checked elsewhere. Watching salt intake.  Patient notes ongoing compliance with antidepressant medication. No obvious side effects. Reports does not miss a dose. Overall continues to help depression substantially. No thoughts of homicide or suicide. Would like to maintain medication. Patient compliant with pain medication. Continues to experience the pain which led to initiation of analgesic intervention. No significant negative side effects. States definitely needs the pain medication to maintain current level of functioning. Does not receive controlled substance pain medication elsewhere.   Dementia currently stable ongoing challenges discussed  [age] xy not helpin g much with the ditropan', Still having difficulties with urinating.  No further symptoms consistent with stroke States no other concerns this visit.  Review of Systems No headache, no major weight loss or weight gain, no chest pain no back pain abdominal pain no change in bowel habits complete ROS otherwise negative     Objective:   Physical Exam  Alert and oriented, vitals reviewed and stable, NAD ENT-TM's and ext canals WNL bilat via otoscopic exam Soft palate, tonsils and post pharynx WNL via oropharyngeal exam Neck-symmetric, no masses; thyroid nonpalpable and nontender Pulmonary-no tachypnea or accessory muscle use; Clear without wheezes via auscultation Card--no abnrml murmurs, rhythm reg and rate WNL Carotid pulses  symmetric, without bruits       Assessment & Plan:  Impression: Chronic pain. Patient compliant with medication. No substantial side effects. [place] controlled substance registry reviewed to ensure compliance and proper use of medication. Patient aware goal of medicine is not complete resolution of pain but to control his symptoms to improve his functional capacity. Aware of potential adverse side effects  2 hypertension good control discussed maintain same meds #3 incontinence ongoing substantial challenges discussed will increase to Ditropan 10 XL #4 dementia ongoing with ongoing challenges. [facility] could not provide support rationale discussed. All medications refilled follow-up in 3 months

## 2017-02-09 ENCOUNTER — Other Ambulatory Visit: Payer: Self-pay | Admitting: Family Medicine

## 2017-03-07 DIAGNOSIS — M6281 Muscle weakness (generalized): Secondary | ICD-10-CM | POA: Diagnosis not present

## 2017-03-08 ENCOUNTER — Other Ambulatory Visit: Payer: Self-pay | Admitting: Family Medicine

## 2017-03-08 NOTE — Telephone Encounter (Signed)
Last seen 02/05/17 

## 2017-03-15 ENCOUNTER — Other Ambulatory Visit: Payer: Self-pay | Admitting: Family Medicine

## 2017-04-05 ENCOUNTER — Other Ambulatory Visit: Payer: Self-pay | Admitting: Family Medicine

## 2017-04-07 DIAGNOSIS — M6281 Muscle weakness (generalized): Secondary | ICD-10-CM | POA: Diagnosis not present

## 2017-04-12 ENCOUNTER — Other Ambulatory Visit: Payer: Self-pay | Admitting: Family Medicine

## 2017-04-13 NOTE — Telephone Encounter (Signed)
Ok  Six ref

## 2017-04-29 ENCOUNTER — Other Ambulatory Visit: Payer: Self-pay | Admitting: Family Medicine

## 2017-05-07 ENCOUNTER — Ambulatory Visit: Payer: Self-pay | Admitting: Family Medicine

## 2017-05-07 ENCOUNTER — Telehealth: Payer: Self-pay | Admitting: Family Medicine

## 2017-05-07 MED ORDER — MECLIZINE HCL 25 MG PO TABS: ORAL_TABLET | ORAL | 0 refills | Status: DC

## 2017-05-07 NOTE — Telephone Encounter (Signed)
Patient states needs refill on Meclizine not Hydrocodone. May we refill?

## 2017-05-07 NOTE — Telephone Encounter (Signed)
Ok may refill. Did you see card??

## 2017-05-07 NOTE — Telephone Encounter (Signed)
Also no one has seen the card. Patient does not need a refill on Hydrocodone states has plenty. Would like a refill on Meclizine?

## 2017-05-07 NOTE — Telephone Encounter (Signed)
Yes may ref

## 2017-05-07 NOTE — Telephone Encounter (Signed)
Pt was scheduled for an appt in the system at 930 this morning. Pt showed up at 1pm stating that their card and the automated message stated 1pm. Pt will need a refill till there next appt on 10/2/.

## 2017-05-08 DIAGNOSIS — M6281 Muscle weakness (generalized): Secondary | ICD-10-CM | POA: Diagnosis not present

## 2017-05-10 ENCOUNTER — Encounter: Payer: Self-pay | Admitting: Family Medicine

## 2017-05-18 ENCOUNTER — Ambulatory Visit: Payer: PPO | Admitting: Family Medicine

## 2017-05-20 ENCOUNTER — Other Ambulatory Visit: Payer: Self-pay | Admitting: Family Medicine

## 2017-05-28 ENCOUNTER — Ambulatory Visit (INDEPENDENT_AMBULATORY_CARE_PROVIDER_SITE_OTHER): Payer: PPO | Admitting: Family Medicine

## 2017-05-28 ENCOUNTER — Encounter: Payer: Self-pay | Admitting: Family Medicine

## 2017-05-28 VITALS — BP 130/80 | Ht 61.0 in | Wt 172.2 lb

## 2017-05-28 DIAGNOSIS — I10 Essential (primary) hypertension: Secondary | ICD-10-CM

## 2017-05-28 DIAGNOSIS — F03918 Unspecified dementia, unspecified severity, with other behavioral disturbance: Secondary | ICD-10-CM

## 2017-05-28 DIAGNOSIS — G894 Chronic pain syndrome: Secondary | ICD-10-CM | POA: Diagnosis not present

## 2017-05-28 DIAGNOSIS — Z23 Encounter for immunization: Secondary | ICD-10-CM | POA: Diagnosis not present

## 2017-05-28 DIAGNOSIS — F0391 Unspecified dementia with behavioral disturbance: Secondary | ICD-10-CM

## 2017-05-28 MED ORDER — HYDROCODONE-ACETAMINOPHEN 5-325 MG PO TABS: ORAL_TABLET | ORAL | 0 refills | Status: DC

## 2017-05-28 NOTE — Progress Notes (Signed)
   Subjective:    Patient ID: Nichole [name], female    DOB: 07/04/1946, 71 y.o.   MRN: [id number]  HPI This patient was seen today for chronic pain  The medication list was reviewed and updated.   -Compliance with medication:Takes daily   - Number patient states they take daily: Takes 1-2 per day.   -when was the last dose patient took?  The patient was advised the importance of maintaining medication and not using illegal substances with these.  Refills needed: yes   The patient was educated that we can provide 3 monthly scripts for their medication, it is their responsibility to follow the instructions.  Side effects or complications from medications: constipation.  Patient is aware that pain medications are meant to minimize the severity of the pain to allow their pain levels to improve to allow for better function. They are aware of that pain medications cannot totally remove their pain.  Due for UDT ( at least once per year) :   Blood pressure medicine and blood pressure levels reviewed today with patient. Compliant with blood pressure medicine. States does not miss a dose. No obvious side effects. Blood pressure generally good when checked elsewhere. Watching salt intake.   Ongoing challenges with dementia yet things seem to stabilize somewhat lately. Compliant with the Aricept. Seems to be helping. Would like to stay on it.  Status post stroke. No new evidence of stroke. Patient does have residual pain at the site of her old stroke.  Patient compliant with pain medication. Continues to experience the pain which led to initiation of analgesic intervention. No significant negative side effects. States definitely needs the pain medication to maintain current level of functioning. Does not receive controlled substance pain medication elsewhere.    Patient states no concerns this visit.   Review of Systems No headache, no major weight loss or weight gain, no chest pain no  back pain abdominal pain no change in bowel habits complete ROS otherwise negative     Objective:   Physical Exam  Alert and oriented, vitals reviewed and stable, NAD ENT-TM's and ext canals WNL bilat via otoscopic exam Soft palate, tonsils and post pharynx WNL via oropharyngeal exam Neck-symmetric, no masses; thyroid nonpalpable and nontender Pulmonary-no tachypnea or accessory muscle use; Clear without wheezes via auscultation Card--no abnrml murmurs, rhythm reg and rate WNL Carotid pulses symmetric, without bruits       Assessment & Plan:  mpression 1 chronic pain ongoing definitely need for medications discussed will maintain  Impression: Chronic pain. Patient compliant with medication. No substantial side effects. [place] controlled substance registry reviewed to ensure compliance and proper use of medication. Patient aware goal of medicine is not complete resolution of pain but to control his symptoms to improve his functional capacity. Aware of potential adverse side effects  #2 hypertension good control discussed maintain same meds  #3 dementia. Family hold enough currently. Maintain same meds.  Flu shot. Follow-up as discussed.

## 2017-06-01 ENCOUNTER — Other Ambulatory Visit: Payer: Self-pay | Admitting: Family Medicine

## 2017-06-01 NOTE — Telephone Encounter (Signed)
Last seen 02/05/17 

## 2017-06-07 DIAGNOSIS — M6281 Muscle weakness (generalized): Secondary | ICD-10-CM | POA: Diagnosis not present

## 2017-06-09 ENCOUNTER — Other Ambulatory Visit: Payer: Self-pay | Admitting: Family Medicine

## 2017-06-21 ENCOUNTER — Other Ambulatory Visit: Payer: Self-pay | Admitting: Family Medicine

## 2017-07-05 ENCOUNTER — Other Ambulatory Visit: Payer: Self-pay | Admitting: Family Medicine

## 2017-07-08 DIAGNOSIS — M6281 Muscle weakness (generalized): Secondary | ICD-10-CM | POA: Diagnosis not present

## 2017-07-13 ENCOUNTER — Other Ambulatory Visit: Payer: Self-pay | Admitting: Family Medicine

## 2017-07-13 NOTE — Telephone Encounter (Signed)
Last seen 05/28/17 for chronic office visit.

## 2017-07-15 ENCOUNTER — Other Ambulatory Visit: Payer: Self-pay | Admitting: *Deleted

## 2017-07-15 ENCOUNTER — Telehealth: Payer: Self-pay | Admitting: Family Medicine

## 2017-07-15 MED ORDER — CEFPROZIL 500 MG PO TABS: 500.0000 mg | ORAL_TABLET | Freq: Two times a day (BID) | ORAL | 0 refills | Status: DC

## 2017-07-15 NOTE — Telephone Encounter (Signed)
Please leave message with husband. Husband states if you tell her she may not understand.

## 2017-07-15 NOTE — Telephone Encounter (Signed)
Pt's husband called stating that the pt has a bad cough,with congestion and sinus issues. Pt doesn't want to come in. Husband is requesting something to be called in if possible. Please advise.

## 2017-07-15 NOTE — Telephone Encounter (Signed)
No fever, no sob, cough and congestion. Drainage is yellow to green. Started over 2 weeks ago.

## 2017-07-15 NOTE — Telephone Encounter (Signed)
cefzil 500 bid ten d 

## 2017-07-15 NOTE — Telephone Encounter (Signed)
Med sent to pharm. Pt's husband notified

## 2017-07-28 ENCOUNTER — Other Ambulatory Visit: Payer: Self-pay | Admitting: Family Medicine

## 2017-08-05 ENCOUNTER — Other Ambulatory Visit: Payer: Self-pay | Admitting: Family Medicine

## 2017-08-05 ENCOUNTER — Telehealth: Payer: Self-pay | Admitting: Family Medicine

## 2017-08-05 MED ORDER — HYDROCHLOROTHIAZIDE 25 MG PO TABS: 25.0000 mg | ORAL_TABLET | Freq: Every day | ORAL | 5 refills | Status: DC

## 2017-08-05 MED ORDER — MECLIZINE HCL 25 MG PO TABS: ORAL_TABLET | ORAL | 5 refills | Status: DC

## 2017-08-05 MED ORDER — MELOXICAM 7.5 MG PO TABS: 7.5000 mg | ORAL_TABLET | Freq: Every day | ORAL | 5 refills | Status: DC

## 2017-08-05 MED ORDER — DOXAZOSIN MESYLATE 2 MG PO TABS: 2.0000 mg | ORAL_TABLET | Freq: Every day | ORAL | 5 refills | Status: DC

## 2017-08-05 MED ORDER — DILTIAZEM HCL ER COATED BEADS 240 MG PO CP24: 240.0000 mg | ORAL_CAPSULE | Freq: Every day | ORAL | 5 refills | Status: DC

## 2017-08-05 NOTE — Telephone Encounter (Signed)
Husband(Earl) is calling requesting refills for patient medication.He states called [facility] last week for refills but still hasnt heard anything so patient is completely out now and needing refills on diltiazem 240 mg,meloxicam 7.5 mg, meclizine 25 mg, hydrochlorothiazide 25 mg,doxazosin 2 mg called into [facility]

## 2017-08-05 NOTE — Telephone Encounter (Signed)
Six mo worth of all meds/be sure to keep f u appt whenever it is

## 2017-08-05 NOTE — Telephone Encounter (Signed)
Prescriptions sent electronically to pharmacy. Patient notified. °

## 2017-08-07 DIAGNOSIS — M6281 Muscle weakness (generalized): Secondary | ICD-10-CM | POA: Diagnosis not present

## 2017-08-26 ENCOUNTER — Ambulatory Visit (INDEPENDENT_AMBULATORY_CARE_PROVIDER_SITE_OTHER): Payer: PPO | Admitting: Family Medicine

## 2017-08-26 ENCOUNTER — Encounter: Payer: Self-pay | Admitting: Family Medicine

## 2017-08-26 VITALS — BP 138/84 | Ht 61.0 in | Wt 168.8 lb

## 2017-08-26 DIAGNOSIS — Z79891 Long term (current) use of opiate analgesic: Secondary | ICD-10-CM

## 2017-08-26 DIAGNOSIS — R5383 Other fatigue: Secondary | ICD-10-CM

## 2017-08-26 DIAGNOSIS — E785 Hyperlipidemia, unspecified: Secondary | ICD-10-CM | POA: Diagnosis not present

## 2017-08-26 DIAGNOSIS — Z79899 Other long term (current) drug therapy: Secondary | ICD-10-CM

## 2017-08-26 MED ORDER — HYDROCODONE-ACETAMINOPHEN 5-325 MG PO TABS: ORAL_TABLET | ORAL | 0 refills | Status: DC

## 2017-08-26 MED ORDER — OXYBUTYNIN CHLORIDE ER 10 MG PO TB24: ORAL_TABLET | ORAL | 3 refills | Status: DC

## 2017-08-26 MED ORDER — MELOXICAM 7.5 MG PO TABS: 7.5000 mg | ORAL_TABLET | Freq: Every day | ORAL | 11 refills | Status: DC

## 2017-08-26 MED ORDER — DILTIAZEM HCL ER COATED BEADS 240 MG PO CP24: 240.0000 mg | ORAL_CAPSULE | Freq: Every day | ORAL | 11 refills | Status: DC

## 2017-08-26 MED ORDER — ALBUTEROL SULFATE HFA 108 (90 BASE) MCG/ACT IN AERS: 2.0000 | INHALATION_SPRAY | Freq: Four times a day (QID) | RESPIRATORY_TRACT | 11 refills | Status: DC | PRN

## 2017-08-26 MED ORDER — DOXAZOSIN MESYLATE 2 MG PO TABS: 2.0000 mg | ORAL_TABLET | Freq: Every day | ORAL | 11 refills | Status: DC

## 2017-08-26 MED ORDER — MECLIZINE HCL 25 MG PO TABS: ORAL_TABLET | ORAL | 11 refills | Status: DC

## 2017-08-26 MED ORDER — HYDROCHLOROTHIAZIDE 25 MG PO TABS: 25.0000 mg | ORAL_TABLET | Freq: Every day | ORAL | 11 refills | Status: DC

## 2017-08-26 MED ORDER — ESCITALOPRAM OXALATE 20 MG PO TABS: 20.0000 mg | ORAL_TABLET | Freq: Every day | ORAL | 11 refills | Status: DC

## 2017-08-26 MED ORDER — DONEPEZIL HCL 10 MG PO TBDP: ORAL_TABLET | ORAL | 3 refills | Status: DC

## 2017-08-26 NOTE — Progress Notes (Signed)
   Subjective:    Patient ID: Nichole [name], female    DOB: [birth year], 72 y.o.   MRN: [id number]  HPI This patient was seen today for chronic pain  The medication list was reviewed and updated.   -Compliance with medication: Hydrocodone  - Number patient states they take daily: 2 a day  -when was the last dose patient took? Today  The patient was advised the importance of maintaining medication and not using illegal substances with these.  Refills needed: yes  The patient was educated that we can provide 3 monthly scripts for their medication, it is their responsibility to follow the instructions.  Side effects or complications from medications: none  Patient is aware that pain medications are meant to minimize the severity of the pain to allow their pain levels to improve to allow for better function. They are aware of that pain medications cannot totally remove their pain.  Due for UDT ( at least once per year) :   Blood pressure medicine and blood pressure levels reviewed today with patient. Compliant with blood pressure medicine. States does not miss a dose. No obvious side effects. Blood pressure generally good when checked elsewhere. Watching salt intake.  No new neurological symptoms of stroke. no loss of consciousness but no new TIA symptomatology.  Patient reports depression is worsening.  This despite compared with current medication.  Frustrated about ongoing challenges with dementia and debility       Review of Systems No headache, no major weight loss or weight gain, no chest pain no back pain abdominal pain no change in bowel habits complete ROS otherwise negative     Objective:   Physical Exam Alert and oriented, vitals reviewed and stable, NAD ENT-TM's and ext canals WNL bilat via otoscopic exam Soft palate, tonsils and post pharynx WNL via oropharyngeal exam Neck-symmetric, no masses; thyroid nonpalpable and nontender Pulmonary-no tachypnea or  accessory muscle use; Clear without wheezes via auscultation Card--no abnrml murmurs, rhythm reg and rate WNL Carotid pulses symmetric, without bruits Neurological exam stable       Assessment & Plan:  Impression 1 worsening depression discussed will adjust medicine switch to new medication.  Rationale discussed  2.  Hypertension clinically stable discussed to maintain same meds  3.  Chronic pain.  .pd Impression: Chronic pain. Patient compliant with medication. No substantial side effects. [place] controlled substance registry reviewed to ensure compliance and proper use of medication. Patient aware goal of medicine is not complete resolution of pain but to control his symptoms to improve his functional capacity. Aware of potential adverse side effects

## 2017-08-27 ENCOUNTER — Ambulatory Visit: Payer: PPO | Admitting: Family Medicine

## 2017-08-27 LAB — BASIC METABOLIC PANEL
BUN/Creatinine Ratio: 20 (ref 12–28)
BUN: 12 mg/dL (ref 8–27)
CO2: 26 mmol/L (ref 20–29)
Calcium: 10.2 mg/dL (ref 8.7–10.3)
Chloride: 99 mmol/L (ref 96–106)
Creatinine, Ser: 0.59 mg/dL (ref 0.57–1.00)
GFR calc Af Amer: 105 mL/min/{1.73_m2} (ref >59)
GFR calc non Af Amer: 91 mL/min/{1.73_m2} (ref >59)
Glucose: 130 mg/dL — ABNORMAL HIGH (ref 65–99)
Potassium: 3.6 mmol/L (ref 3.5–5.2)
Sodium: 143 mmol/L (ref 134–144)

## 2017-08-27 LAB — LIPID PANEL
Chol/HDL Ratio: 3.2 ratio (ref 0.0–4.4)
Cholesterol, Total: 223 mg/dL — ABNORMAL HIGH (ref 100–199)
HDL: 70 mg/dL (ref >39)
LDL Calculated: 135 mg/dL — ABNORMAL HIGH (ref 0–99)
Triglycerides: 91 mg/dL (ref 0–149)
VLDL Cholesterol Cal: 18 mg/dL (ref 5–40)

## 2017-08-27 LAB — TSH: TSH: 2.99 u[IU]/mL (ref 0.450–4.500)

## 2017-08-27 LAB — HEPATIC FUNCTION PANEL
ALT: 13 IU/L (ref 0–32)
AST: 17 IU/L (ref 0–40)
Albumin: 4.8 g/dL (ref 3.5–4.8)
Alkaline Phosphatase: 105 IU/L (ref 39–117)
Bilirubin Total: 0.6 mg/dL (ref 0.0–1.2)
Bilirubin, Direct: 0.22 mg/dL (ref 0.00–0.40)
Total Protein: 7.3 g/dL (ref 6.0–8.5)

## 2017-08-30 ENCOUNTER — Encounter: Payer: Self-pay | Admitting: Family Medicine

## 2017-09-01 LAB — TOXASSURE SELECT 13 (MW), URINE

## 2017-09-07 DIAGNOSIS — M6281 Muscle weakness (generalized): Secondary | ICD-10-CM | POA: Diagnosis not present

## 2017-11-11 ENCOUNTER — Ambulatory Visit (INDEPENDENT_AMBULATORY_CARE_PROVIDER_SITE_OTHER): Payer: PPO | Admitting: Family Medicine

## 2017-11-11 ENCOUNTER — Encounter: Payer: Self-pay | Admitting: Family Medicine

## 2017-11-11 VITALS — BP 138/82 | Ht 61.0 in | Wt 151.8 lb

## 2017-11-11 DIAGNOSIS — G894 Chronic pain syndrome: Secondary | ICD-10-CM

## 2017-11-11 DIAGNOSIS — F0391 Unspecified dementia with behavioral disturbance: Secondary | ICD-10-CM | POA: Diagnosis not present

## 2017-11-11 DIAGNOSIS — I10 Essential (primary) hypertension: Secondary | ICD-10-CM | POA: Diagnosis not present

## 2017-11-11 DIAGNOSIS — F03918 Unspecified dementia, unspecified severity, with other behavioral disturbance: Secondary | ICD-10-CM

## 2017-11-11 MED ORDER — HYDROCODONE-ACETAMINOPHEN 5-325 MG PO TABS: ORAL_TABLET | ORAL | 0 refills | Status: DC

## 2017-11-11 NOTE — Progress Notes (Signed)
   Subjective:    Patient ID: Nichole [name], female    DOB: 01/13/1946, 72 y.o.   MRN: [id number]  HPI This patient was seen today for chronic pain  The medication list was reviewed and updated.   -Compliance with medication: yes  - Number patient states they take daily: one  -when was the last dose patient took? Last night about 10:30pm  The patient was advised the importance of maintaining medication and not using illegal substances with these.  Here for refills and follow up  The patient was educated that we can provide 3 monthly scripts for their medication, it is their responsibility to follow the instructions.  Side effects or complications from medications: no  Patient is aware that pain medications are meant to minimize the severity of the pain to allow their pain levels to improve to allow for better function. They are aware of that pain medications cannot totally remove their pain.  Due for UDT ( at least once per year) : 08/26/2017    Patient compliant with pain medication. Continues to experience the pain which led to initiation of analgesic intervention. No significant negative side effects. States definitely needs the pain medication to maintain current level of functioning. Does not receive controlled substance pain medication elsewhere. Right leg still hurting   breahing overall ok  Patient notes ongoing compliance with antidepressant medication. No obvious side effects. Reports does not miss a dose. Overall continues to help depression substantially. No thoughts of homicide or suicide. Would like to maintain medication.  Compliant with   Dementia meds   Somewhat down hill a bit in terms of thinking  Blood pressure medicine and blood pressure levels reviewed today with patient. Compliant with blood pressure medicine. States does not miss a dose. No obvious side effects. Blood pressure generally good when checked elsewhere. Watching salt intake.    Review  of Systems No headache, no major weight loss or weight gain, no chest pain no back pain abdominal pain no change in bowel habits complete ROS otherwise negative     Objective:   Physical Exam   Alert and oriented, vitals reviewed and stable, NAD ENT-TM's and ext canals WNL bilat via otoscopic exam Soft palate, tonsils and post pharynx WNL via oropharyngeal exam Neck-symmetric, no masses; thyroid nonpalpable and nontender Pulmonary-no tachypnea or accessory muscle use; Clear without wheezes via auscultation Card--no abnrml murmurs, rhythm reg and rate WNL Carotid pulses symmetric, without bruits Impression 1     Assessment & Plan:  1 impression 1 chronic dementia.  Mild decline per family.  To maintain same meds  2.  Impression: Chronic pain. Patient compliant with medication. No substantial side effects. [place] controlled substance registry reviewed to ensure compliance and proper use of medication. Patient aware goal of medicine is not complete resolution of pain but to control his symptoms to improve his functional capacity. Aware of potential adverse side effects  #3 depression.  Clinically stable.  Compliant with meds no obvious side effects.  4.  Hypertension good control discussed to maintain same meds  Follow-up in several months diet exercise also discussed.  Medications refilled.

## 2017-11-12 ENCOUNTER — Ambulatory Visit: Payer: PPO | Admitting: Family Medicine

## 2017-11-18 ENCOUNTER — Ambulatory Visit: Payer: PPO | Admitting: Family Medicine

## 2018-02-09 ENCOUNTER — Ambulatory Visit: Payer: PPO | Admitting: Family Medicine

## 2018-02-11 ENCOUNTER — Ambulatory Visit: Payer: PPO | Admitting: Family Medicine

## 2018-03-02 ENCOUNTER — Encounter: Payer: Self-pay | Admitting: Family Medicine

## 2018-03-02 ENCOUNTER — Ambulatory Visit (INDEPENDENT_AMBULATORY_CARE_PROVIDER_SITE_OTHER): Payer: PPO | Admitting: Family Medicine

## 2018-03-02 VITALS — BP 130/78 | Ht 61.0 in | Wt 173.0 lb

## 2018-03-02 DIAGNOSIS — E785 Hyperlipidemia, unspecified: Secondary | ICD-10-CM | POA: Diagnosis not present

## 2018-03-02 DIAGNOSIS — I619 Nontraumatic intracerebral hemorrhage, unspecified: Secondary | ICD-10-CM

## 2018-03-02 DIAGNOSIS — F0391 Unspecified dementia with behavioral disturbance: Secondary | ICD-10-CM | POA: Diagnosis not present

## 2018-03-02 DIAGNOSIS — G894 Chronic pain syndrome: Secondary | ICD-10-CM | POA: Diagnosis not present

## 2018-03-02 DIAGNOSIS — I10 Essential (primary) hypertension: Secondary | ICD-10-CM | POA: Diagnosis not present

## 2018-03-02 DIAGNOSIS — R2681 Unsteadiness on feet: Secondary | ICD-10-CM | POA: Diagnosis not present

## 2018-03-02 DIAGNOSIS — J441 Chronic obstructive pulmonary disease with (acute) exacerbation: Secondary | ICD-10-CM | POA: Diagnosis not present

## 2018-03-02 DIAGNOSIS — F03918 Unspecified dementia, unspecified severity, with other behavioral disturbance: Secondary | ICD-10-CM

## 2018-03-02 MED ORDER — HYDROCODONE-ACETAMINOPHEN 5-325 MG PO TABS: ORAL_TABLET | ORAL | 0 refills | Status: DC

## 2018-03-02 MED ORDER — ALBUTEROL SULFATE HFA 108 (90 BASE) MCG/ACT IN AERS: 2.0000 | INHALATION_SPRAY | Freq: Four times a day (QID) | RESPIRATORY_TRACT | 2 refills | Status: DC | PRN

## 2018-03-02 NOTE — Progress Notes (Signed)
Subjective:  Patient arrives office with numerous medical and social concerns  Patient ID: Nichole [name], female    DOB: 05-06-1946, 72 y.o.   MRN: [id number]  HPI This patient was seen today for chronic pain  The medication list was reviewed and updated.   -Compliance with medication: yes  - Number patient states they take daily: one to two  -when was the last dose patient took? yesterday  The patient was advised the importanc      +++ e of maintaining medication and not using illegal substances with these.  Here for refills and follow up  The patient was educated that we can provide 3 monthly scripts for their medication, it is their responsibility to follow the instructions.  Side effects or complications from medications: none   Patient is aware that pain medications are meant to minimize the severity of the pain to allow their pain levels to improve to allow for better function. They are aware of that pain medications cannot totally remove their pain.  Due for UDT ( at least once per year) : 08/2017   Patient notes ongoing compliance with antidepressant medication. No obvious side effects. Reports does not miss a dose. Overall continues to help depression substantially. No thoughts of homicide or suicide. Would like to maintain medication.  Blood pressure medicine and blood pressure levels reviewed today with patient. Compliant with blood pressure medicine. States does not miss a dose. No obvious side effects. Blood pressure generally good when checked elsewhere. Watching salt intake.  Patient still has occasional wheeze.  Family notes was overusing and were before.  History of Alzheimer's disease.  Family reports dementia has not worsened considerably.  Patient thinks she is fairly stable in this regard.  Major frustration expressed on the part of spouse about patient's short-term memory loss.  Also major frustration regarding no family support with their children  not really helping out.  [facility] and they said they could not help them due to the substantial element of dementia       Review of Systems No headache, no major weight loss or weight gain, no chest pain no back pain abdominal pain no change in bowel habits complete ROS otherwise negative     Objective:   Physical Exam Alert and oriented, vitals reviewed and stable, NAD ENT-TM's and ext canals WNL bilat via otoscopic exam Soft palate, tonsils and post pharynx WNL via oropharyngeal exam Neck-symmetric, no masses; thyroid nonpalpable and nontender Pulmonary-no tachypnea or accessory muscle use;  with deep breaths mild expiratory wheezes via auscultation Card--no abnrml murmurs, rhythm reg and rate WNL Carotid pulses symmetric, without bruits        Assessment & Plan:  Impression 1 status post stroke.  No new symptoms.  2.  Dementia.  Clinically stable.  Patient maintaining Aricept  3.  Depression.  Clinically will maintain medications.  4.  Hypertension blood pressure good today discussed to maintain same meds  5.  Back pain.  Primarily the right leg discussed to maintain same  Impression: Chronic pain. Patient compliant with medication. No substantial side effects. [place] controlled substance registry reviewed to ensure compliance and proper use of medication. Patient aware goal of medicine is not complete resolution of pain but to control his symptoms to improve his functional capacity. Aware of potential adverse side effects #6 challenging social situation.  Encourage patient to approach social services/discussed.  Family discussed in great length their frustrations in this regard.  Medications refilled diet exercise discussed  Greater than 50% of this 40 minute face to face visit was spent in counseling and discussion and coordination of care regarding the above diagnosis/diagnosies

## 2018-06-02 ENCOUNTER — Ambulatory Visit: Payer: PPO | Admitting: Family Medicine

## 2018-06-06 ENCOUNTER — Encounter: Payer: Self-pay | Admitting: Family Medicine

## 2018-06-06 ENCOUNTER — Ambulatory Visit (INDEPENDENT_AMBULATORY_CARE_PROVIDER_SITE_OTHER): Payer: PPO | Admitting: Family Medicine

## 2018-06-06 VITALS — BP 134/72 | Ht 61.0 in | Wt 170.0 lb

## 2018-06-06 DIAGNOSIS — I10 Essential (primary) hypertension: Secondary | ICD-10-CM | POA: Diagnosis not present

## 2018-06-06 DIAGNOSIS — G894 Chronic pain syndrome: Secondary | ICD-10-CM

## 2018-06-06 DIAGNOSIS — F0391 Unspecified dementia with behavioral disturbance: Secondary | ICD-10-CM

## 2018-06-06 DIAGNOSIS — F03918 Unspecified dementia, unspecified severity, with other behavioral disturbance: Secondary | ICD-10-CM

## 2018-06-06 DIAGNOSIS — J441 Chronic obstructive pulmonary disease with (acute) exacerbation: Secondary | ICD-10-CM

## 2018-06-06 DIAGNOSIS — R2681 Unsteadiness on feet: Secondary | ICD-10-CM

## 2018-06-06 DIAGNOSIS — Z23 Encounter for immunization: Secondary | ICD-10-CM

## 2018-06-06 MED ORDER — OXYBUTYNIN CHLORIDE ER 10 MG PO TB24: ORAL_TABLET | ORAL | 3 refills | Status: DC

## 2018-06-06 MED ORDER — HYDROCHLOROTHIAZIDE 25 MG PO TABS: 25.0000 mg | ORAL_TABLET | Freq: Every day | ORAL | 11 refills | Status: DC

## 2018-06-06 MED ORDER — HYDROCODONE-ACETAMINOPHEN 5-325 MG PO TABS: ORAL_TABLET | ORAL | 0 refills | Status: DC

## 2018-06-06 MED ORDER — DOXAZOSIN MESYLATE 2 MG PO TABS: 2.0000 mg | ORAL_TABLET | Freq: Every day | ORAL | 11 refills | Status: DC

## 2018-06-06 MED ORDER — ZOSTER VAC RECOMB ADJUVANTED 50 MCG/0.5ML IM SUSR: 0.5000 mL | Freq: Once | INTRAMUSCULAR | 1 refills | Status: AC

## 2018-06-06 MED ORDER — DILTIAZEM HCL ER COATED BEADS 240 MG PO CP24: 240.0000 mg | ORAL_CAPSULE | Freq: Every day | ORAL | 11 refills | Status: DC

## 2018-06-06 MED ORDER — MELOXICAM 7.5 MG PO TABS: 7.5000 mg | ORAL_TABLET | Freq: Every day | ORAL | 11 refills | Status: DC

## 2018-06-06 MED ORDER — ALBUTEROL SULFATE HFA 108 (90 BASE) MCG/ACT IN AERS: 2.0000 | INHALATION_SPRAY | Freq: Four times a day (QID) | RESPIRATORY_TRACT | 11 refills | Status: DC | PRN

## 2018-06-06 MED ORDER — ESCITALOPRAM OXALATE 20 MG PO TABS: 20.0000 mg | ORAL_TABLET | Freq: Every day | ORAL | 11 refills | Status: DC

## 2018-06-06 MED ORDER — DONEPEZIL HCL 10 MG PO TBDP: ORAL_TABLET | ORAL | 3 refills | Status: DC

## 2018-06-06 NOTE — Progress Notes (Signed)
   Subjective:    Patient ID: Nichole [name], female    DOB: [birth year], 72 y.o.   MRN: [id number]  Hypertension  This is a chronic problem. Compliance problems include exercise (takes meds every day, eats healthy, not able to do much exercise but walks around as much as she can).    Pt would like to get a years worth of refills.   Would like to get flu vaccine and any other vaccines she needs today.   Still quite forgetful, but per husb both the phys and the mental side of things are stable  Blood pressure medicine and blood pressure levels reviewed today with patient. Compliant with blood pressure medicine. States does not miss a dose. No obvious side effects. Blood pressure generally good when checked elsewhere. Watching salt intake.   Patient compliant with pain medication. Continues to experience the pain which led to initiation of analgesic intervention. No significant negative side effects. States definitely needs the pain medication to maintain current level of functioning. Does not receive controlled substance pain medication elsewhere.   Review of Systems No headache, no major weight loss or weight gain, no chest pain no back pain abdominal pain no change in bowel habits complete ROS otherwise negative     Objective:   Physical Exam  Alert and baseline orientation impression 1 chronic pain ongoing with ongoing need breast, vitals reviewed and stable, NAD ENT-TM's and ext canals WNL bilat via otoscopic exam Soft palate, tonsils and post pharynx WNL via oropharyngeal exam Neck-symmetric, no masses; thyroid nonpalpable and nontender Pulmonary-no tachypnea or accessory muscle use; Clear without wheezes via auscultation Card--no abnrml murmurs, rhythm reg and rate WNL Carotid pulses symmetric, without bruits       Assessment & Plan:  Impression chronic pain.  Ongoing pain with ongoing need for medications  Impression: Chronic pain. Patient compliant with medication. No  substantial side effects. [place] controlled substance registry reviewed to ensure compliance and proper use of medication. Patient aware goal of medicine is not complete resolution of pain but to control his symptoms to improve his functional capacity. Aware of potential adverse side effects  #2 dementia.  Ongoing challenge for patient and family.  At this time had a steady state though certainly not the best for patient or spouse.  Her spouse was too confused [facility]  3.  Hypertension good control discussed maintain same meds  4.  COPD clinically stable needing flu shot  Follow-up 3 months as scheduled

## 2018-06-15 ENCOUNTER — Telehealth: Payer: Self-pay | Admitting: Family Medicine

## 2018-06-15 NOTE — Telephone Encounter (Signed)
Pt's insurance ([employer]) does not pay for any type of a sitter  Please advise

## 2018-06-15 NOTE — Telephone Encounter (Signed)
Left message to return call 

## 2018-06-15 NOTE — Telephone Encounter (Signed)
Patient's husband(DPR) notified and verbalized understanding

## 2018-06-15 NOTE — Telephone Encounter (Signed)
I had already advised the pt's spouse of thi likelihood, plz let him know

## 2018-07-08 ENCOUNTER — Other Ambulatory Visit: Payer: Self-pay | Admitting: Family Medicine

## 2018-07-20 ENCOUNTER — Encounter: Payer: Self-pay | Admitting: Gastroenterology

## 2018-08-08 ENCOUNTER — Other Ambulatory Visit: Payer: Self-pay | Admitting: Family Medicine

## 2018-08-29 ENCOUNTER — Other Ambulatory Visit: Payer: Self-pay | Admitting: Family Medicine

## 2018-09-06 ENCOUNTER — Encounter: Payer: Self-pay | Admitting: Family Medicine

## 2018-09-06 ENCOUNTER — Ambulatory Visit (INDEPENDENT_AMBULATORY_CARE_PROVIDER_SITE_OTHER): Payer: PPO | Admitting: Family Medicine

## 2018-09-06 VITALS — BP 126/78 | Ht 61.0 in | Wt 164.1 lb

## 2018-09-06 DIAGNOSIS — Z79899 Other long term (current) drug therapy: Secondary | ICD-10-CM | POA: Diagnosis not present

## 2018-09-06 DIAGNOSIS — Z79891 Long term (current) use of opiate analgesic: Secondary | ICD-10-CM | POA: Diagnosis not present

## 2018-09-06 DIAGNOSIS — R5383 Other fatigue: Secondary | ICD-10-CM

## 2018-09-06 DIAGNOSIS — E785 Hyperlipidemia, unspecified: Secondary | ICD-10-CM

## 2018-09-06 DIAGNOSIS — I10 Essential (primary) hypertension: Secondary | ICD-10-CM

## 2018-09-06 DIAGNOSIS — F03918 Unspecified dementia, unspecified severity, with other behavioral disturbance: Secondary | ICD-10-CM

## 2018-09-06 DIAGNOSIS — F0391 Unspecified dementia with behavioral disturbance: Secondary | ICD-10-CM | POA: Diagnosis not present

## 2018-09-06 DIAGNOSIS — G894 Chronic pain syndrome: Secondary | ICD-10-CM

## 2018-09-06 MED ORDER — HYDROCODONE-ACETAMINOPHEN 5-325 MG PO TABS: ORAL_TABLET | ORAL | 0 refills | Status: DC

## 2018-09-06 NOTE — Progress Notes (Signed)
Subjective:    Patient ID: Nichole [name], female    DOB: 09-Jun-1946, 73 y.o.   MRN: [id number] Patient arrives office with multiple concerns HPI  Patient is here today to follow up on her chronic health issues and for her pain management.   She has a history of Hypertension and takes Cardizem 240 mg daily,Cardura 2 mg per day, Hctz 25 mg daily.  She also takes Lexapro 20 mg once per day, per pt for anxiety and depression.  This patient was seen today for chronic pain  The medication list was reviewed and updated.   -Compliance with medication: Ye, Hydrocodone 5-325 mg one bid prn pain.  - Number patient states they take daily: 1-2 per day  -when was the last dose patient took? This am around 9:30 am and 10 am.  The patient was advised the importance of maintaining medication and not using illegal substances with these.  Here for refills and follow up: Yes  The patient was educated that we can provide 3 monthly scripts for their medication, it is their responsibility to follow the instructions.  Side effects or complications from medications: None  Patient is aware that pain medications are meant to minimize the severity of the pain to allow their pain levels to improve to allow for better function. They are aware of that pain medications cannot totally remove their pain.  Due for UDT ( at least once per year) : Due today.    Blood pressure medicine and blood pressure levels reviewed today with patient. Compliant with blood pressure medicine. States does not miss a dose. No obvious side effects. Blood pressure generally good when checked elsewhere. Watching salt intake.  Patient compliant with pain medication. Continues to experience the pain which led to initiation of analgesic intervention. No significant negative side effects. States definitely needs the pain medication to maintain current level of functioning. Does not receive controlled substance pain medication  elsewhere.  Patient notes ongoing compliance with antidepressant medication. No obvious side effects. Reports does not miss a dose. Overall continues to help depression substantially. No thoughts of homicide or suicide. Would like to maintain medication.  Ongoing substanital pain   Mood swings,yesterday morning overall good,   This mornign got ugly and angry   Nutrition decent, p o intake off and on +   +       Review of Systems No headache, no major weight loss or weight gain, no chest pain no back pain abdominal pain no change in bowel habits complete ROS otherwise negative     Objective:   Physical Exam Alert and oriented, vitals reviewed and stable, NAD ENT-TM's and ext canals WNL bilat via otoscopic exam Soft palate, tonsils and post pharynx WNL via oropharyngeal exam Neck-symmetric, no masses; thyroid nonpalpable and nontender Pulmonary-no tachypnea or accessory muscle use; Clear without wheezes via auscultation Card--no abnrml murmurs, rhythm reg and rate WNL Carotid pulses symmetric, without bruits        Assessment & Plan:  Impression 1 hypertension.  Good control discussed maintain same meds  2.  COPD clinically stable rare use of inhaler at this time  3.  Dementia with intermittent behaviors disturbance.  Overall stable per family.  4.  Chronic pain ongoing with ongoing need for meds  Impression: Chronic pain. Patient compliant with medication. No substantial side effects. [place] controlled substance registry reviewed to ensure compliance and proper use of medication. Patient aware goal of medicine is not complete resolution of pain but  to control his symptoms to improve his functional capacity. Aware of potential adverse side effects  Meds refilled diet exercise discussed.  Follow-up in 3 months.

## 2018-09-11 LAB — TOXASSURE SELECT 13 (MW), URINE

## 2018-09-11 LAB — SPECIMEN STATUS REPORT

## 2018-09-28 ENCOUNTER — Other Ambulatory Visit: Payer: Self-pay | Admitting: Family Medicine

## 2018-09-28 DIAGNOSIS — R5383 Other fatigue: Secondary | ICD-10-CM | POA: Diagnosis not present

## 2018-09-28 DIAGNOSIS — I10 Essential (primary) hypertension: Secondary | ICD-10-CM | POA: Diagnosis not present

## 2018-09-28 DIAGNOSIS — Z79899 Other long term (current) drug therapy: Secondary | ICD-10-CM | POA: Diagnosis not present

## 2018-09-28 DIAGNOSIS — E785 Hyperlipidemia, unspecified: Secondary | ICD-10-CM | POA: Diagnosis not present

## 2018-09-29 LAB — BASIC METABOLIC PANEL
BUN/Creatinine Ratio: 14 (ref 12–28)
BUN: 11 mg/dL (ref 8–27)
CO2: 26 mmol/L (ref 20–29)
Calcium: 10 mg/dL (ref 8.7–10.3)
Chloride: 103 mmol/L (ref 96–106)
Creatinine, Ser: 0.8 mg/dL (ref 0.57–1.00)
GFR calc Af Amer: 84 mL/min/{1.73_m2} (ref >59)
GFR calc non Af Amer: 73 mL/min/{1.73_m2} (ref >59)
Glucose: 84 mg/dL (ref 65–99)
Potassium: 3.9 mmol/L (ref 3.5–5.2)
Sodium: 144 mmol/L (ref 134–144)

## 2018-09-29 LAB — HEPATIC FUNCTION PANEL
ALT: 7 IU/L (ref 0–32)
AST: 14 IU/L (ref 0–40)
Albumin: 4.4 g/dL (ref 3.7–4.7)
Alkaline Phosphatase: 102 IU/L (ref 39–117)
Bilirubin Total: 0.8 mg/dL (ref 0.0–1.2)
Bilirubin, Direct: 0.23 mg/dL (ref 0.00–0.40)
Total Protein: 6.6 g/dL (ref 6.0–8.5)

## 2018-09-29 LAB — CBC WITH DIFFERENTIAL/PLATELET
Basophils Absolute: 0 10*3/uL (ref 0.0–0.2)
Basos: 1 %
EOS (ABSOLUTE): 0.2 10*3/uL (ref 0.0–0.4)
Eos: 4 %
Hematocrit: 43.9 % (ref 34.0–46.6)
Hemoglobin: 14.8 g/dL (ref 11.1–15.9)
Immature Grans (Abs): 0 10*3/uL (ref 0.0–0.1)
Immature Granulocytes: 1 %
Lymphocytes Absolute: 1.6 10*3/uL (ref 0.7–3.1)
Lymphs: 27 %
MCH: 29.1 pg (ref 26.6–33.0)
MCHC: 33.7 g/dL (ref 31.5–35.7)
MCV: 86 fL (ref 79–97)
Monocytes Absolute: 0.4 10*3/uL (ref 0.1–0.9)
Monocytes: 7 %
Neutrophils Absolute: 3.5 10*3/uL (ref 1.4–7.0)
Neutrophils: 60 %
Platelets: 411 10*3/uL (ref 150–450)
RBC: 5.08 x10E6/uL (ref 3.77–5.28)
RDW: 12.3 % (ref 11.7–15.4)
WBC: 5.7 10*3/uL (ref 3.4–10.8)

## 2018-09-29 LAB — TSH: TSH: 2.55 u[IU]/mL (ref 0.450–4.500)

## 2018-09-29 LAB — LIPID PANEL
Chol/HDL Ratio: 4.2 ratio (ref 0.0–4.4)
Cholesterol, Total: 232 mg/dL — ABNORMAL HIGH (ref 100–199)
HDL: 55 mg/dL (ref >39)
LDL Calculated: 157 mg/dL — ABNORMAL HIGH (ref 0–99)
Triglycerides: 99 mg/dL (ref 0–149)
VLDL Cholesterol Cal: 20 mg/dL (ref 5–40)

## 2018-10-02 ENCOUNTER — Encounter: Payer: Self-pay | Admitting: Family Medicine

## 2018-10-07 ENCOUNTER — Other Ambulatory Visit: Payer: Self-pay

## 2018-10-07 ENCOUNTER — Other Ambulatory Visit: Payer: Self-pay | Admitting: Family Medicine

## 2018-10-07 MED ORDER — MECLIZINE HCL 25 MG PO TABS: ORAL_TABLET | ORAL | 0 refills | Status: DC

## 2018-10-07 NOTE — Telephone Encounter (Signed)
Ok om meclizine ck on the hydrocodone did we not do three rx last mo?

## 2018-10-07 NOTE — Telephone Encounter (Signed)
Contacted [facility]. They do have 3 scripts for pain med. Sent meclizine refill in.

## 2018-11-04 ENCOUNTER — Other Ambulatory Visit: Payer: Self-pay | Admitting: Family Medicine

## 2018-12-06 ENCOUNTER — Ambulatory Visit (INDEPENDENT_AMBULATORY_CARE_PROVIDER_SITE_OTHER): Payer: PPO | Admitting: Family Medicine

## 2018-12-06 ENCOUNTER — Encounter: Payer: Self-pay | Admitting: Family Medicine

## 2018-12-06 ENCOUNTER — Other Ambulatory Visit: Payer: Self-pay

## 2018-12-06 VITALS — Wt 140.0 lb

## 2018-12-06 DIAGNOSIS — F0391 Unspecified dementia with behavioral disturbance: Secondary | ICD-10-CM | POA: Diagnosis not present

## 2018-12-06 DIAGNOSIS — E785 Hyperlipidemia, unspecified: Secondary | ICD-10-CM

## 2018-12-06 DIAGNOSIS — Z79891 Long term (current) use of opiate analgesic: Secondary | ICD-10-CM | POA: Diagnosis not present

## 2018-12-06 DIAGNOSIS — I10 Essential (primary) hypertension: Secondary | ICD-10-CM

## 2018-12-06 DIAGNOSIS — J441 Chronic obstructive pulmonary disease with (acute) exacerbation: Secondary | ICD-10-CM | POA: Diagnosis not present

## 2018-12-06 DIAGNOSIS — F03918 Unspecified dementia, unspecified severity, with other behavioral disturbance: Secondary | ICD-10-CM

## 2018-12-06 MED ORDER — DONEPEZIL HCL 10 MG PO TBDP: ORAL_TABLET | ORAL | 1 refills | Status: DC

## 2018-12-06 MED ORDER — ESCITALOPRAM OXALATE 20 MG PO TABS: 20.0000 mg | ORAL_TABLET | Freq: Every day | ORAL | 5 refills | Status: DC

## 2018-12-06 MED ORDER — MELOXICAM 7.5 MG PO TABS: 7.5000 mg | ORAL_TABLET | Freq: Every day | ORAL | 5 refills | Status: DC

## 2018-12-06 MED ORDER — OXYBUTYNIN CHLORIDE ER 10 MG PO TB24: ORAL_TABLET | ORAL | 1 refills | Status: DC

## 2018-12-06 MED ORDER — HYDROCODONE-ACETAMINOPHEN 5-325 MG PO TABS: ORAL_TABLET | ORAL | 0 refills | Status: DC

## 2018-12-06 MED ORDER — DOXAZOSIN MESYLATE 2 MG PO TABS: 2.0000 mg | ORAL_TABLET | Freq: Every day | ORAL | 5 refills | Status: DC

## 2018-12-06 MED ORDER — HYDROCHLOROTHIAZIDE 25 MG PO TABS: 25.0000 mg | ORAL_TABLET | Freq: Every day | ORAL | 5 refills | Status: DC

## 2018-12-06 MED ORDER — DILTIAZEM HCL ER COATED BEADS 240 MG PO CP24: 240.0000 mg | ORAL_CAPSULE | Freq: Every day | ORAL | 5 refills | Status: DC

## 2018-12-06 NOTE — Progress Notes (Signed)
Subjective:    Patient ID: Nichole [name], female    DOB: Jun 24, 1946, 73 y.o.   MRN: [id number]  HPI This patient was seen today for chronic pain  The medication list was reviewed and updated.   -Compliance with medication: hydrocodone 5-325 mg tablet one twice daily prn moderate pain   - Number patient states they take daily: no more than 2   -when was the last dose patient took? Last night at bedtimes  The patient was advised the importance of maintaining medication and not using illegal substances with these.  Here for refills and follow up  The patient was educated that we can provide 3 monthly scripts for their medication, it is their responsibility to follow the instructions.  Side effects or complications from medications: none  Patient is aware that pain medications are meant to minimize the severity of the pain to allow their pain levels to improve to allow for better function. They are aware of that pain medications cannot totally remove their pain.  Due for UDT ( at least once per year) :  BP has been doing ok. Pt husband states no complications. Pt husband states he does not have a way to check BP.   Virtual Visit via Telephone Note  I connected with Nichole BankerVivian L Tantillo on 12/06/18 at  1:40 PM EDT by telephone and verified that I am speaking with the correct person using two identifiers.   I discussed the limitations, risks, security and privacy concerns of performing an evaluation and management service by telephone and the availability of in person appointments. I also discussed with the patient that there may be a patient responsible charge related to this service. The patient expressed understanding and agreed to proceed.   History of Present Illness:    Observations/Objective:   Assessment and Plan:   Follow Up Instructions:    I discussed the assessment and treatment plan with the patient. The patient was provided an opportunity to ask questions and all  were answered. The patient agreed with the plan and demonstrated an understanding of the instructions.   The patient was advised to call back or seek an in-person evaluation if the symptoms worsen or if the condition fails to improve as anticipated.  I provided 25 minutes of non-face-to-face time during this encounter.   Marlowe Shoresanya  Manley, LPN   Blood pressure medicine and blood pressure levels reviewed today with patient. Compliant with blood pressure medicine. States does not miss a dose. No obvious side effects. Blood pressure generally good when checked elsewhere. Watching salt intake.   Patient compliant with pain medication. Continues to experience the pain which led to initiation of analgesic intervention. No significant negative side effects. States definitely needs the pain medication to maintain current level of functioning. Does not receive controlled substance pain medication elsewhere.  Dementia ongoing.  Perhaps slight worsening per patient.  Husband feels overall fairly stable.  Breathing overall decent control at this time.  Had a fair amount of wheezing earlier  2 weeks ago the pollen was bad, used inhaler quite a bit, overall got better       Review of Systems No headache, no major weight loss or weight gain, no chest pain no back pain abdominal pain no change in bowel habits complete ROS otherwise negative     Objective:   Physical Exam   Virtual visit.     Assessment & Plan:  Impression chronic pain ongoing needs for meds discussed patient to maintain same  2.  Hypertension.  Good control discussed patient maintain  3.  COPD.  Chronic in nature.  Overall improved several maintain same for now  Follow-up in 3 months.  Diet exercise discussed.  General concerns regarding coronavirus discussed.

## 2018-12-15 ENCOUNTER — Other Ambulatory Visit: Payer: Self-pay | Admitting: Family Medicine

## 2019-01-14 ENCOUNTER — Other Ambulatory Visit: Payer: Self-pay | Admitting: Family Medicine

## 2019-02-03 ENCOUNTER — Other Ambulatory Visit: Payer: Self-pay | Admitting: Family Medicine

## 2019-03-06 ENCOUNTER — Other Ambulatory Visit: Payer: Self-pay | Admitting: Family Medicine

## 2019-03-07 ENCOUNTER — Ambulatory Visit (INDEPENDENT_AMBULATORY_CARE_PROVIDER_SITE_OTHER): Payer: PPO | Admitting: Family Medicine

## 2019-03-07 ENCOUNTER — Other Ambulatory Visit: Payer: Self-pay

## 2019-03-07 DIAGNOSIS — F0391 Unspecified dementia with behavioral disturbance: Secondary | ICD-10-CM | POA: Diagnosis not present

## 2019-03-07 DIAGNOSIS — G894 Chronic pain syndrome: Secondary | ICD-10-CM | POA: Diagnosis not present

## 2019-03-07 DIAGNOSIS — I10 Essential (primary) hypertension: Secondary | ICD-10-CM | POA: Diagnosis not present

## 2019-03-07 DIAGNOSIS — E785 Hyperlipidemia, unspecified: Secondary | ICD-10-CM

## 2019-03-07 DIAGNOSIS — R2681 Unsteadiness on feet: Secondary | ICD-10-CM

## 2019-03-07 DIAGNOSIS — F03918 Unspecified dementia, unspecified severity, with other behavioral disturbance: Secondary | ICD-10-CM

## 2019-03-07 NOTE — Progress Notes (Signed)
Subjective:  Audio only  Patient ID: Nichole [name], female    DOB: 26-Feb-1946, 73 y.o.   MRN: [id number]  HPI This patient was seen today for chronic pain. Takes for back pain, pain in right leg.   The medication list was reviewed and updated.   -Compliance with medication: takes 2 - 3 day  - Number patient states they take daily: two daily. And sometimes 3 a day  -when was the last dose patient took? today  The patient was advised the importance of maintaining medication and not using illegal substances with these.  Here for refills and follow up  The patient was educated that we can provide 3 monthly scripts for their medication, it is their responsibility to follow the instructions.  Side effects or complications from medications: sleeps a lot but not sure if it is from pain med or not  Patient is aware that pain medications are meant to minimize the severity of the pain to allow their pain levels to improve to allow for better function. They are aware of that pain medications cannot totally remove their pain.  Due for UDT ( at least once per year) : last one done on 09/06/18  Wants to get refill on meclizine. Gets #24 a month but takes every day so wanted to get increase in tablets.   Virtual Visit via Telephone Note  I connected with Nichole [name] on 03/07/19 at  1:40 PM EDT by telephone and verified that I am speaking with the correct person using two identifiers.  Location: Patient: home Provider: office   I discussed the limitations, risks, security and privacy concerns of performing an evaluation and management service by telephone and the availability of in person appointments. I also discussed with the patient that there may be a patient responsible charge related to this service. The patient expressed understanding and agreed to proceed.   History of Present Illness:    Observations/Objective:   Assessment and Plan:   Follow Up Instructions:    I  discussed the assessment and treatment plan with the patient. The patient was provided an opportunity to ask questions and all were answered. The patient agreed with the plan and demonstrated an understanding of the instructions.   The patient was advised to call back or seek an in-person evaluation if the symptoms worsen or if the condition fails to improve as anticipated.  I provided 60minutes of non-face-to-face time during this encounter.   Patient compliant with pain medication. Continues to experience the pain which led to initiation of analgesic intervention. No significant negative side effects. States definitely needs the pain medication to maintain current level of functioning. Does not receive controlled substance pain medication elsewhere.  Patient spouse expresses ongoing challenges with patient's dementia.  Appears to be a bit more forgetful.  Not agitated.  No dangerous loss of awareness.  Just more challenges with memory   Patient reports more dizziness.  Significant at times.  Meclizine definitely helps.  No excess side effects.  Patient requests more amount per bottle      Review of Systems No headache, no major weight loss or weight gain, no chest pain no back pain abdominal pain no change in bowel habits complete ROS otherwise negative     Objective:   Physical Exam  Virtual      Assessment & Plan:  Impression 1 chronic pain.  Ongoing need for meds  Impression: Chronic pain. Patient compliant with medication. No substantial side effects. [employer]  [place] controlled substance registry reviewed to ensure compliance and proper use of medication. Patient aware goal of medicine is not complete resolution of pain but to control his symptoms to improve his functional capacity. Aware of potential adverse side effects  #2 dizziness.  Transient in nature.  Sounds like vertigo.  Meclizine definitely helps will refill  3.  Chronic dementia.  Ongoing.  No current behavioral  disturbance which is corkscrewing use.  Does seem to be a bit more forgetful to continue same follow-up in 3 months

## 2019-03-08 ENCOUNTER — Encounter: Payer: Self-pay | Admitting: Family Medicine

## 2019-03-08 MED ORDER — HYDROCODONE-ACETAMINOPHEN 5-325 MG PO TABS: ORAL_TABLET | ORAL | 0 refills | Status: DC

## 2019-03-08 MED ORDER — MECLIZINE HCL 25 MG PO TABS: ORAL_TABLET | ORAL | 11 refills | Status: DC

## 2019-04-25 ENCOUNTER — Other Ambulatory Visit: Payer: Self-pay

## 2019-04-25 ENCOUNTER — Ambulatory Visit (INDEPENDENT_AMBULATORY_CARE_PROVIDER_SITE_OTHER): Payer: PPO | Admitting: Family Medicine

## 2019-04-25 ENCOUNTER — Encounter: Payer: Self-pay | Admitting: Family Medicine

## 2019-04-25 DIAGNOSIS — J209 Acute bronchitis, unspecified: Secondary | ICD-10-CM | POA: Diagnosis not present

## 2019-04-25 DIAGNOSIS — J441 Chronic obstructive pulmonary disease with (acute) exacerbation: Secondary | ICD-10-CM | POA: Diagnosis not present

## 2019-04-25 DIAGNOSIS — J45909 Unspecified asthma, uncomplicated: Secondary | ICD-10-CM | POA: Diagnosis not present

## 2019-04-25 MED ORDER — NEBULIZER MISC: 0 refills | Status: DC

## 2019-04-25 MED ORDER — ALBUTEROL SULFATE (2.5 MG/3ML) 0.083% IN NEBU: 2.5000 mg | INHALATION_SOLUTION | Freq: Four times a day (QID) | RESPIRATORY_TRACT | 3 refills | Status: DC | PRN

## 2019-04-25 MED ORDER — AZITHROMYCIN 250 MG PO TABS: ORAL_TABLET | ORAL | 0 refills | Status: DC

## 2019-04-25 MED ORDER — ALBUTEROL SULFATE HFA 108 (90 BASE) MCG/ACT IN AERS: 2.0000 | INHALATION_SPRAY | Freq: Four times a day (QID) | RESPIRATORY_TRACT | 3 refills | Status: AC | PRN

## 2019-04-25 MED ORDER — PREDNISONE 10 MG PO TABS: ORAL_TABLET | ORAL | 0 refills | Status: DC

## 2019-04-25 NOTE — Progress Notes (Signed)
   Subjective:  Audio only  Patient ID: Nichole [name], female    DOB: 11-Aug-1946, 73 y.o.   MRN: [id number]  Cough This is a new problem. The current episode started in the past 7 days. Associated symptoms include nasal congestion and wheezing.   Needs inhaler medication per husband   Review of Systems  Respiratory: Positive for cough and wheezing.    Virtual Visit via Video Note  I connected with Nichole [name] on 04/25/19 at  9:30 AM EDT by a video enabled telemedicine application and verified that I am speaking with the correct person using two identifiers.  Location: Patient: home Provider: office   I discussed the limitations of evaluation and management by telemedicine and the availability of in person appointments. The patient expressed understanding and agreed to proceed.  History of Present Illness:    Observations/Objective:   Assessment and Plan:   Follow Up Instructions:    I discussed the assessment and treatment plan with the patient. The patient was provided an opportunity to ask questions and all were answered. The patient agreed with the plan and demonstrated an understanding of the instructions.   The patient was advised to call back or seek an in-person evaluation if the symptoms worsen or if the condition fails to improve as anticipated.  I provided 18 minutes of non-face-to-face time during this encounter.  Patient has known COPD.  Over the past week progressive cough.  Productive at times.  No obvious fever.  Cough definitely wheezing in nature.  Has run out of the albuterol.  Patient's husband concerned patient may not be coordinating the metered-dose inhaler properly with her underlying dementia      Objective:   Physical Exam  Virtual      Assessment & Plan:  Impression exacerbation of COPD.  Was a lot from use of metered-dose inhaler.  Plan Z-Pak.  Prednisone taper.  Nebulizer machine plus 4 times daily use of albuterol proper use  discussed

## 2019-05-25 DIAGNOSIS — J45909 Unspecified asthma, uncomplicated: Secondary | ICD-10-CM | POA: Diagnosis not present

## 2019-05-25 DIAGNOSIS — J209 Acute bronchitis, unspecified: Secondary | ICD-10-CM | POA: Diagnosis not present

## 2019-06-18 ENCOUNTER — Emergency Department (HOSPITAL_COMMUNITY)
Admission: EM | Admit: 2019-06-18 | Discharge: 2019-06-18 | Disposition: A | Discharge status: Home / Self Care | Payer: PPO | Attending: Emergency Medicine | Admitting: Emergency Medicine

## 2019-06-18 ENCOUNTER — Emergency Department (HOSPITAL_COMMUNITY): Payer: PPO

## 2019-06-18 ENCOUNTER — Other Ambulatory Visit: Payer: Self-pay

## 2019-06-18 ENCOUNTER — Encounter (HOSPITAL_COMMUNITY): Payer: Self-pay | Admitting: Emergency Medicine

## 2019-06-18 DIAGNOSIS — S0990XA Unspecified injury of head, initial encounter: Secondary | ICD-10-CM | POA: Diagnosis not present

## 2019-06-18 DIAGNOSIS — S299XXA Unspecified injury of thorax, initial encounter: Secondary | ICD-10-CM | POA: Diagnosis present

## 2019-06-18 DIAGNOSIS — G309 Alzheimer's disease, unspecified: Secondary | ICD-10-CM | POA: Insufficient documentation

## 2019-06-18 DIAGNOSIS — J449 Chronic obstructive pulmonary disease, unspecified: Secondary | ICD-10-CM | POA: Diagnosis not present

## 2019-06-18 DIAGNOSIS — S199XXA Unspecified injury of neck, initial encounter: Secondary | ICD-10-CM | POA: Diagnosis not present

## 2019-06-18 DIAGNOSIS — M25511 Pain in right shoulder: Secondary | ICD-10-CM | POA: Diagnosis not present

## 2019-06-18 DIAGNOSIS — R079 Chest pain, unspecified: Secondary | ICD-10-CM | POA: Diagnosis not present

## 2019-06-18 DIAGNOSIS — Z87891 Personal history of nicotine dependence: Secondary | ICD-10-CM | POA: Diagnosis not present

## 2019-06-18 DIAGNOSIS — R001 Bradycardia, unspecified: Secondary | ICD-10-CM | POA: Diagnosis not present

## 2019-06-18 DIAGNOSIS — R52 Pain, unspecified: Secondary | ICD-10-CM | POA: Diagnosis not present

## 2019-06-18 DIAGNOSIS — S2231XA Fracture of one rib, right side, initial encounter for closed fracture: Secondary | ICD-10-CM | POA: Insufficient documentation

## 2019-06-18 DIAGNOSIS — Y929 Unspecified place or not applicable: Secondary | ICD-10-CM | POA: Diagnosis not present

## 2019-06-18 DIAGNOSIS — F0390 Unspecified dementia without behavioral disturbance: Secondary | ICD-10-CM | POA: Insufficient documentation

## 2019-06-18 DIAGNOSIS — Y9301 Activity, walking, marching and hiking: Secondary | ICD-10-CM | POA: Diagnosis not present

## 2019-06-18 DIAGNOSIS — W010XXA Fall on same level from slipping, tripping and stumbling without subsequent striking against object, initial encounter: Secondary | ICD-10-CM | POA: Diagnosis not present

## 2019-06-18 DIAGNOSIS — I10 Essential (primary) hypertension: Secondary | ICD-10-CM | POA: Insufficient documentation

## 2019-06-18 DIAGNOSIS — W19XXXA Unspecified fall, initial encounter: Secondary | ICD-10-CM | POA: Diagnosis not present

## 2019-06-18 DIAGNOSIS — Y999 Unspecified external cause status: Secondary | ICD-10-CM | POA: Diagnosis not present

## 2019-06-18 DIAGNOSIS — Z20828 Contact with and (suspected) exposure to other viral communicable diseases: Secondary | ICD-10-CM | POA: Diagnosis not present

## 2019-06-18 DIAGNOSIS — Z79899 Other long term (current) drug therapy: Secondary | ICD-10-CM | POA: Insufficient documentation

## 2019-06-18 DIAGNOSIS — M25561 Pain in right knee: Secondary | ICD-10-CM | POA: Insufficient documentation

## 2019-06-18 DIAGNOSIS — S2241XA Multiple fractures of ribs, right side, initial encounter for closed fracture: Secondary | ICD-10-CM | POA: Diagnosis not present

## 2019-06-18 HISTORY — DX: Unspecified dementia, unspecified severity, without behavioral disturbance, psychotic disturbance, mood disturbance, and anxiety: F03.90

## 2019-06-18 LAB — CBC WITH DIFFERENTIAL/PLATELET
Abs Immature Granulocytes: 0.02 10*3/uL (ref 0.00–0.07)
Basophils Absolute: 0 10*3/uL (ref 0.0–0.1)
Basophils Relative: 0 %
Eosinophils Absolute: 0.2 10*3/uL (ref 0.0–0.5)
Eosinophils Relative: 2 %
HCT: 42.6 % (ref 36.0–46.0)
Hemoglobin: 13.4 g/dL (ref 12.0–15.0)
Immature Granulocytes: 0 %
Lymphocytes Relative: 14 %
Lymphs Abs: 1.2 10*3/uL (ref 0.7–4.0)
MCH: 28.7 pg (ref 26.0–34.0)
MCHC: 31.5 g/dL (ref 30.0–36.0)
MCV: 91.2 fL (ref 80.0–100.0)
Monocytes Absolute: 0.6 10*3/uL (ref 0.1–1.0)
Monocytes Relative: 7 %
Neutro Abs: 6.6 10*3/uL (ref 1.7–7.7)
Neutrophils Relative %: 77 %
Platelets: 368 10*3/uL (ref 150–400)
RBC: 4.67 MIL/uL (ref 3.87–5.11)
RDW: 12.8 % (ref 11.5–15.5)
WBC: 8.6 10*3/uL (ref 4.0–10.5)
nRBC: 0 % (ref 0.0–0.2)

## 2019-06-18 LAB — BASIC METABOLIC PANEL
Anion gap: 9 (ref 5–15)
BUN: 16 mg/dL (ref 8–23)
CO2: 27 mmol/L (ref 22–32)
Calcium: 9.1 mg/dL (ref 8.9–10.3)
Chloride: 105 mmol/L (ref 98–111)
Creatinine, Ser: 0.62 mg/dL (ref 0.44–1.00)
GFR calc Af Amer: >60 mL/min (ref >60)
GFR calc non Af Amer: >60 mL/min (ref >60)
Glucose, Bld: 97 mg/dL (ref 70–99)
Potassium: 3.5 mmol/L (ref 3.5–5.1)
Sodium: 141 mmol/L (ref 135–145)

## 2019-06-18 MED ORDER — FENTANYL CITRATE (PF) 100 MCG/2ML IJ SOLN: 50.0000 ug | Freq: Once | INTRAMUSCULAR | Status: DC

## 2019-06-18 MED ORDER — ALBUTEROL SULFATE HFA 108 (90 BASE) MCG/ACT IN AERS: 2.0000 | INHALATION_SPRAY | Freq: Once | RESPIRATORY_TRACT | Status: DC

## 2019-06-18 MED ORDER — HYDROMORPHONE HCL 1 MG/ML IJ SOLN: 1.0000 mg | Freq: Once | INTRAMUSCULAR | Status: DC

## 2019-06-18 NOTE — Discharge Instructions (Signed)
Use the incentive spirometer 3-4 times daily.  Your goal should be to get the metal ball up to 1500.  Continue taking your home medications as prescribed including your home pain medicines.  You can take Tylenol 1 to 2 tablets every 6 hours as needed for pain.  Do not exceed 4000 mg of Tylenol daily  We did recommend admission to the hospital due to your rib fracture but you did not want to stay.  Please call your primary care provider tomorrow to set up close follow-up in their office for sometime this week.  Return to the emergency department immediately if any concerning signs or symptoms develop such as shortness of breath, fevers, abnormal cough, worsening chest pains, or loss of consciousness.

## 2019-06-18 NOTE — ED Notes (Signed)
Updated spouse Luisana Lutzke [phone]
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
## 2019-06-18 NOTE — Progress Notes (Signed)
Hospitalist paged for admission, but patient left AMA from the ED, I did not see/ evaluate this patient.  Arlyce Dice,, MD. TRH. 06/18/2019 9:52 PM   LOS- NO CHARGE.

## 2019-06-18 NOTE — ED Notes (Signed)
Did not obtain SARS test due to pt leaving.
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
## 2019-06-18 NOTE — Progress Notes (Signed)
RT performed IS with patient. Goal set at 1750. Patient attempted x3 with results as follows: 1000 750 1000 with good effort

## 2019-06-19 ENCOUNTER — Telehealth: Payer: Self-pay | Admitting: Family Medicine

## 2019-06-19 NOTE — Telephone Encounter (Signed)
rec move up o v to later this week we can do acute and chronic

## 2019-06-19 NOTE — Telephone Encounter (Signed)
Pt husband contacted and verbalized understanding. Pt husband transferred up front to set up appt for later this week.

## 2019-06-19 NOTE — Telephone Encounter (Signed)
Left message to return call 

## 2019-06-19 NOTE — Telephone Encounter (Signed)
Pt's husband wants to speak to a nurse about pt's ER visit yesterday, her fall that caused the trip to the ER & test results  (offered a virtual visit for tomorrow as Dr. Jeannine Kitten schedule is full for today & the husband wanted to "update" [place] on pt & ask questions)   Please advise & call pt

## 2019-06-19 NOTE — Telephone Encounter (Signed)
Pt husband states that ER doctor told them to give PCP update today. Pt was taken to ER last night regarding a fall. Pt has appt on 06/30/2019 with provider. Husband would like to know if Dr.Steve wants to see patient earlier or should he keep doing home care. Pt is sleeping at this time. Please advise. Thank you

## 2019-06-22 ENCOUNTER — Ambulatory Visit (INDEPENDENT_AMBULATORY_CARE_PROVIDER_SITE_OTHER): Payer: PPO | Admitting: Family Medicine

## 2019-06-22 ENCOUNTER — Other Ambulatory Visit: Payer: Self-pay

## 2019-06-22 ENCOUNTER — Encounter: Payer: Self-pay | Admitting: Family Medicine

## 2019-06-22 VITALS — BP 124/70 | Temp 98.4°F | Ht 63.0 in | Wt 152.4 lb

## 2019-06-22 DIAGNOSIS — M25511 Pain in right shoulder: Secondary | ICD-10-CM

## 2019-06-22 DIAGNOSIS — Z23 Encounter for immunization: Secondary | ICD-10-CM

## 2019-06-22 DIAGNOSIS — S2231XA Fracture of one rib, right side, initial encounter for closed fracture: Secondary | ICD-10-CM

## 2019-06-22 DIAGNOSIS — M25561 Pain in right knee: Secondary | ICD-10-CM | POA: Diagnosis not present

## 2019-06-22 MED ORDER — HYDROCODONE-ACETAMINOPHEN 5-325 MG PO TABS: ORAL_TABLET | ORAL | 0 refills | Status: DC

## 2019-06-22 NOTE — Progress Notes (Signed)
   Subjective:    Patient ID: Nichole [name], female    DOB: Aug 24, 1945, 73 y.o.   MRN: [id number]  HPIpt arrives with husband Hedy Camara for hospital follow up. She fell and hurt right knee and right shoulder and has right rib fracture.   Would like results of covid screen that was done in hospital.   Would like a flu vaccine today and would like ears checked for wax build up.      Review of Systems No chest pain no shortness of breath    Objective:   Physical Exam  Alert no acute distress.  Lungs clear.  Heart regular rate and rhythm.  Chest wall tender in area of rib fracture.      Assessment & Plan:  Impression 1 rib fracture.  Pain control discussed.  Flu shot today.  Cerumen management discussed

## 2019-06-25 DIAGNOSIS — J45909 Unspecified asthma, uncomplicated: Secondary | ICD-10-CM | POA: Diagnosis not present

## 2019-06-25 DIAGNOSIS — J209 Acute bronchitis, unspecified: Secondary | ICD-10-CM | POA: Diagnosis not present

## 2019-06-30 ENCOUNTER — Ambulatory Visit: Payer: PPO | Admitting: Family Medicine

## 2019-07-25 ENCOUNTER — Encounter (HOSPITAL_COMMUNITY): Payer: Self-pay | Admitting: *Deleted

## 2019-07-25 ENCOUNTER — Emergency Department (HOSPITAL_COMMUNITY)
Admission: EM | Admit: 2019-07-25 | Discharge: 2019-07-25 | Disposition: A | Discharge status: Home / Self Care | Payer: PPO | Attending: Emergency Medicine | Admitting: Emergency Medicine

## 2019-07-25 ENCOUNTER — Emergency Department (HOSPITAL_COMMUNITY): Payer: PPO

## 2019-07-25 ENCOUNTER — Other Ambulatory Visit: Payer: Self-pay

## 2019-07-25 DIAGNOSIS — E876 Hypokalemia: Secondary | ICD-10-CM

## 2019-07-25 DIAGNOSIS — G309 Alzheimer's disease, unspecified: Secondary | ICD-10-CM | POA: Diagnosis not present

## 2019-07-25 DIAGNOSIS — W19XXXA Unspecified fall, initial encounter: Secondary | ICD-10-CM

## 2019-07-25 DIAGNOSIS — G8929 Other chronic pain: Secondary | ICD-10-CM | POA: Diagnosis not present

## 2019-07-25 DIAGNOSIS — Y92009 Unspecified place in unspecified non-institutional (private) residence as the place of occurrence of the external cause: Secondary | ICD-10-CM

## 2019-07-25 DIAGNOSIS — J45909 Unspecified asthma, uncomplicated: Secondary | ICD-10-CM | POA: Diagnosis not present

## 2019-07-25 DIAGNOSIS — N390 Urinary tract infection, site not specified: Secondary | ICD-10-CM | POA: Insufficient documentation

## 2019-07-25 DIAGNOSIS — J209 Acute bronchitis, unspecified: Secondary | ICD-10-CM | POA: Diagnosis not present

## 2019-07-25 DIAGNOSIS — J449 Chronic obstructive pulmonary disease, unspecified: Secondary | ICD-10-CM | POA: Diagnosis not present

## 2019-07-25 DIAGNOSIS — M5489 Other dorsalgia: Secondary | ICD-10-CM | POA: Diagnosis not present

## 2019-07-25 DIAGNOSIS — M545 Low back pain, unspecified: Secondary | ICD-10-CM

## 2019-07-25 DIAGNOSIS — Z87891 Personal history of nicotine dependence: Secondary | ICD-10-CM | POA: Diagnosis not present

## 2019-07-25 DIAGNOSIS — F0280 Dementia in other diseases classified elsewhere without behavioral disturbance: Secondary | ICD-10-CM | POA: Insufficient documentation

## 2019-07-25 DIAGNOSIS — G8911 Acute pain due to trauma: Secondary | ICD-10-CM | POA: Diagnosis not present

## 2019-07-25 DIAGNOSIS — R52 Pain, unspecified: Secondary | ICD-10-CM | POA: Diagnosis not present

## 2019-07-25 DIAGNOSIS — I10 Essential (primary) hypertension: Secondary | ICD-10-CM | POA: Diagnosis not present

## 2019-07-25 DIAGNOSIS — Z79899 Other long term (current) drug therapy: Secondary | ICD-10-CM | POA: Diagnosis not present

## 2019-07-25 DIAGNOSIS — S069X9A Unspecified intracranial injury with loss of consciousness of unspecified duration, initial encounter: Secondary | ICD-10-CM | POA: Diagnosis not present

## 2019-07-25 LAB — BASIC METABOLIC PANEL
Anion gap: 10 (ref 5–15)
BUN: 13 mg/dL (ref 8–23)
CO2: 27 mmol/L (ref 22–32)
Calcium: 9.1 mg/dL (ref 8.9–10.3)
Chloride: 104 mmol/L (ref 98–111)
Creatinine, Ser: 0.53 mg/dL (ref 0.44–1.00)
GFR calc Af Amer: >60 mL/min (ref >60)
GFR calc non Af Amer: >60 mL/min (ref >60)
Glucose, Bld: 77 mg/dL (ref 70–99)
Potassium: 3 mmol/L — ABNORMAL LOW (ref 3.5–5.1)
Sodium: 141 mmol/L (ref 135–145)

## 2019-07-25 LAB — URINALYSIS, ROUTINE W REFLEX MICROSCOPIC
Bilirubin Urine: NEGATIVE
Glucose, UA: NEGATIVE mg/dL
Ketones, ur: NEGATIVE mg/dL
Nitrite: POSITIVE — AB
Protein, ur: NEGATIVE mg/dL
Specific Gravity, Urine: 1.008 (ref 1.005–1.030)
pH: 6 (ref 5.0–8.0)

## 2019-07-25 LAB — CBC WITH DIFFERENTIAL/PLATELET
Abs Immature Granulocytes: 0.05 10*3/uL (ref 0.00–0.07)
Basophils Absolute: 0 10*3/uL (ref 0.0–0.1)
Basophils Relative: 0 %
Eosinophils Absolute: 0.1 10*3/uL (ref 0.0–0.5)
Eosinophils Relative: 1 %
HCT: 45.8 % (ref 36.0–46.0)
Hemoglobin: 14.4 g/dL (ref 12.0–15.0)
Immature Granulocytes: 0 %
Lymphocytes Relative: 10 %
Lymphs Abs: 1.2 10*3/uL (ref 0.7–4.0)
MCH: 28.5 pg (ref 26.0–34.0)
MCHC: 31.4 g/dL (ref 30.0–36.0)
MCV: 90.5 fL (ref 80.0–100.0)
Monocytes Absolute: 0.7 10*3/uL (ref 0.1–1.0)
Monocytes Relative: 6 %
Neutro Abs: 9.9 10*3/uL — ABNORMAL HIGH (ref 1.7–7.7)
Neutrophils Relative %: 83 %
Platelets: 417 10*3/uL — ABNORMAL HIGH (ref 150–400)
RBC: 5.06 MIL/uL (ref 3.87–5.11)
RDW: 12.7 % (ref 11.5–15.5)
WBC: 12 10*3/uL — ABNORMAL HIGH (ref 4.0–10.5)
nRBC: 0 % (ref 0.0–0.2)

## 2019-07-25 MED ORDER — POTASSIUM CHLORIDE CRYS ER 20 MEQ PO TBCR: 20.0000 meq | EXTENDED_RELEASE_TABLET | Freq: Every day | ORAL | 0 refills | Status: DC

## 2019-07-25 MED ORDER — POTASSIUM CHLORIDE CRYS ER 20 MEQ PO TBCR
40.0000 meq | EXTENDED_RELEASE_TABLET | Freq: Once | ORAL | Status: AC
  Administered 2019-07-25: 40 meq via ORAL
  Filled 2019-07-25: qty 2

## 2019-07-25 MED ORDER — CEPHALEXIN 500 MG PO CAPS: 500.0000 mg | ORAL_CAPSULE | Freq: Four times a day (QID) | ORAL | 0 refills | Status: DC

## 2019-07-25 MED ORDER — ACETAMINOPHEN 325 MG PO TABS
650.0000 mg | ORAL_TABLET | Freq: Once | ORAL | Status: AC
  Administered 2019-07-25: 19:00:00 650 mg via ORAL
  Filled 2019-07-25: qty 2

## 2019-07-25 NOTE — Discharge Instructions (Addendum)
Take antibiotic and potassium as directed. Tylenol for back pain. Consider using a [place] at home. Please follow-up with your primary care provider as discussed for pain management options.

## 2019-07-25 NOTE — ED Notes (Signed)
Pt's spouse Hedy Camara Afzal notified  [phone] advised that pt is ready for discharge, discharge instructions reviewed with spouse,

## 2019-07-25 NOTE — ED Triage Notes (Signed)
Pt brought in from home by [facility] with c/o fall with back pain today. Pt reports she has "a little dementia and sometimes I don't know where I'm going". Pt unaware if she had LOC. Pt has hx of 5 prior lumbar back surgeries. Pt was ambulatory for EMS.
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
## 2019-07-29 LAB — URINE CULTURE
Culture: >=100000 — AB
Special Requests: NORMAL

## 2019-07-30 ENCOUNTER — Telehealth: Payer: Self-pay | Admitting: Emergency Medicine

## 2019-07-30 NOTE — Telephone Encounter (Signed)
Post ED Visit - Positive Culture Follow-up  Culture report reviewed by antimicrobial stewardship pharmacist: [place] Team []  Elenor Quinones, Pharm.D. []  Heide Guile, Pharm.D., BCPS AQ-ID []  Parks Neptune, Pharm.D., BCPS []  Alycia Rossetti, Pharm.D., BCPS []  [place], Pharm.D., BCPS, AAHIVP []  Legrand Como, Pharm.D., BCPS, AAHIVP []  Salome Arnt, PharmD, BCPS []  Johnnette Gourd, PharmD, BCPS []  Hughes Better, PharmD, BCPS [x]  Duanne Limerick, PharmD []  Laqueta Linden, PharmD, BCPS []  Albertina Parr, PharmD  [place] Team []  Leodis Sias, PharmD []  Lindell Spar, PharmD []  Royetta Asal, PharmD []  Graylin Shiver, Rph []  Rema Fendt) Glennon Mac, PharmD []  Arlyn Dunning, PharmD []  Netta Cedars, PharmD []  Dia Sitter, PharmD []  Leone Haven, PharmD []  Gretta Arab, PharmD []  Theodis Shove, PharmD []  Peggyann Juba, PharmD []  Reuel Boom, PharmD   Positive urine culture Treated with Cephalexin, organism sensitive to the same and no further patient follow-up is required at this time.  [name] 07/30/2019, 4:00 PM

## 2019-07-31 ENCOUNTER — Ambulatory Visit (INDEPENDENT_AMBULATORY_CARE_PROVIDER_SITE_OTHER): Payer: PPO | Admitting: Family Medicine

## 2019-07-31 ENCOUNTER — Other Ambulatory Visit: Payer: Self-pay

## 2019-07-31 ENCOUNTER — Encounter: Payer: Self-pay | Admitting: Family Medicine

## 2019-07-31 DIAGNOSIS — F0391 Unspecified dementia with behavioral disturbance: Secondary | ICD-10-CM | POA: Diagnosis not present

## 2019-07-31 DIAGNOSIS — I10 Essential (primary) hypertension: Secondary | ICD-10-CM

## 2019-07-31 DIAGNOSIS — N3000 Acute cystitis without hematuria: Secondary | ICD-10-CM | POA: Diagnosis not present

## 2019-07-31 DIAGNOSIS — G894 Chronic pain syndrome: Secondary | ICD-10-CM

## 2019-07-31 DIAGNOSIS — F03918 Unspecified dementia, unspecified severity, with other behavioral disturbance: Secondary | ICD-10-CM

## 2019-07-31 MED ORDER — HYDROCODONE-ACETAMINOPHEN 5-325 MG PO TABS: ORAL_TABLET | ORAL | 0 refills | Status: DC

## 2019-07-31 NOTE — Progress Notes (Signed)
   Subjective:  Audio only  Patient ID: Nichole [name], female    DOB: May 30, 1946, 73 y.o.   MRN: [id number]  HPIER follow up. Went by ambulance for a fall.  She was prescribed potassium for 3 days and keflex for UTI. Husband states she is doing good today. Back pain is better and no fever.   Virtual Visit via Telephone Note  I connected with Nichole [name] on 07/31/19 at  1:40 PM EST by telephone and verified that I am speaking with the correct person using two identifiers.  Location: Patient: home Provider: office   I discussed the limitations, risks, security and privacy concerns of performing an evaluation and management service by telephone and the availability of in person appointments. I also discussed with the patient that there may be a patient responsible charge related to this service. The patient expressed understanding and agreed to proceed.   History of Present Illness:    Observations/Objective:   Assessment and Plan:   Follow Up Instructions:    I discussed the assessment and treatment plan with the patient. The patient was provided an opportunity to ask questions and all were answered. The patient agreed with the plan and demonstrated an understanding of the instructions.   The patient was advised to call back or seek an in-person evaluation if the symptoms worsen or if the condition fails to improve as anticipated.  I provided 25 minutes of non-face-to-face time during this encounter. Patient calls along with husband for follow-up of acute and chronic concerns  Complete hospital record and scan results and x-ray results and blood work results and culture results all reviewed today in presence of patient  Patient is finished her antibiotic  Patient's back pain has improved since follow-up but still ongoing and substantial  No major shortness of breath  Ongoing use of pain medications  Ongoing support from husband due to patient's progressive  dementia.       Review of Systems No chest pain no shortness of breath no cough no fever no chills    Objective:   Physical Exam  Virtual      Assessment & Plan:  Impression 1 back contusion.  Negative x-rays.  Symptom care discussed.  Ongoing pain medication use in this regard  Impression: Chronic pain. Patient compliant with medication. No substantial side effects. [place] controlled substance registry reviewed to ensure compliance and proper use of medication. Patient aware goal of medicine is not complete resolution of pain but to control his symptoms to improve his functional capacity. Aware of potential adverse side effects   2.  Urinary tract infection.  On Keflex.  Sensitive to the antibiotic that she is on.  E. coli.  No need for further testing rationale discussed  3.  Dementia ongoing challenge  Follow-up in 3 months diet exercise discussed pain medication use discussed warning signs discussed questions answered

## 2019-08-25 DIAGNOSIS — J45909 Unspecified asthma, uncomplicated: Secondary | ICD-10-CM | POA: Diagnosis not present

## 2019-08-25 DIAGNOSIS — J209 Acute bronchitis, unspecified: Secondary | ICD-10-CM | POA: Diagnosis not present

## 2019-09-06 ENCOUNTER — Emergency Department (HOSPITAL_COMMUNITY): Payer: PPO

## 2019-09-06 ENCOUNTER — Other Ambulatory Visit: Payer: Self-pay

## 2019-09-06 ENCOUNTER — Emergency Department (HOSPITAL_COMMUNITY)
Admission: EM | Admit: 2019-09-06 | Discharge: 2019-09-06 | Disposition: A | Discharge status: Home / Self Care | Payer: PPO | Attending: Emergency Medicine | Admitting: Emergency Medicine

## 2019-09-06 ENCOUNTER — Encounter (HOSPITAL_COMMUNITY): Payer: Self-pay

## 2019-09-06 DIAGNOSIS — I10 Essential (primary) hypertension: Secondary | ICD-10-CM | POA: Insufficient documentation

## 2019-09-06 DIAGNOSIS — J449 Chronic obstructive pulmonary disease, unspecified: Secondary | ICD-10-CM | POA: Diagnosis not present

## 2019-09-06 DIAGNOSIS — M79603 Pain in arm, unspecified: Secondary | ICD-10-CM | POA: Diagnosis not present

## 2019-09-06 DIAGNOSIS — M79602 Pain in left arm: Secondary | ICD-10-CM | POA: Diagnosis not present

## 2019-09-06 DIAGNOSIS — S0990XA Unspecified injury of head, initial encounter: Secondary | ICD-10-CM | POA: Diagnosis not present

## 2019-09-06 DIAGNOSIS — Z87891 Personal history of nicotine dependence: Secondary | ICD-10-CM | POA: Insufficient documentation

## 2019-09-06 DIAGNOSIS — R269 Unspecified abnormalities of gait and mobility: Secondary | ICD-10-CM | POA: Diagnosis not present

## 2019-09-06 DIAGNOSIS — W19XXXA Unspecified fall, initial encounter: Secondary | ICD-10-CM | POA: Diagnosis not present

## 2019-09-06 DIAGNOSIS — E876 Hypokalemia: Secondary | ICD-10-CM

## 2019-09-06 DIAGNOSIS — Z79899 Other long term (current) drug therapy: Secondary | ICD-10-CM | POA: Insufficient documentation

## 2019-09-06 DIAGNOSIS — R296 Repeated falls: Secondary | ICD-10-CM | POA: Insufficient documentation

## 2019-09-06 DIAGNOSIS — R52 Pain, unspecified: Secondary | ICD-10-CM | POA: Diagnosis not present

## 2019-09-06 DIAGNOSIS — S42212A Unspecified displaced fracture of surgical neck of left humerus, initial encounter for closed fracture: Secondary | ICD-10-CM

## 2019-09-06 DIAGNOSIS — S4992XA Unspecified injury of left shoulder and upper arm, initial encounter: Secondary | ICD-10-CM | POA: Diagnosis present

## 2019-09-06 DIAGNOSIS — Y92003 Bedroom of unspecified non-institutional (private) residence as the place of occurrence of the external cause: Secondary | ICD-10-CM | POA: Diagnosis not present

## 2019-09-06 DIAGNOSIS — W06XXXA Fall from bed, initial encounter: Secondary | ICD-10-CM | POA: Diagnosis not present

## 2019-09-06 DIAGNOSIS — G309 Alzheimer's disease, unspecified: Secondary | ICD-10-CM | POA: Diagnosis not present

## 2019-09-06 DIAGNOSIS — Y939 Activity, unspecified: Secondary | ICD-10-CM | POA: Insufficient documentation

## 2019-09-06 DIAGNOSIS — R609 Edema, unspecified: Secondary | ICD-10-CM | POA: Diagnosis not present

## 2019-09-06 DIAGNOSIS — Z20822 Contact with and (suspected) exposure to covid-19: Secondary | ICD-10-CM | POA: Diagnosis not present

## 2019-09-06 DIAGNOSIS — Y999 Unspecified external cause status: Secondary | ICD-10-CM | POA: Insufficient documentation

## 2019-09-06 DIAGNOSIS — S199XXA Unspecified injury of neck, initial encounter: Secondary | ICD-10-CM | POA: Diagnosis not present

## 2019-09-06 LAB — CBC WITH DIFFERENTIAL/PLATELET
Abs Immature Granulocytes: 0.07 10*3/uL (ref 0.00–0.07)
Basophils Absolute: 0 10*3/uL (ref 0.0–0.1)
Basophils Relative: 0 %
Eosinophils Absolute: 0 10*3/uL (ref 0.0–0.5)
Eosinophils Relative: 0 %
HCT: 42.5 % (ref 36.0–46.0)
Hemoglobin: 13.6 g/dL (ref 12.0–15.0)
Immature Granulocytes: 0 %
Lymphocytes Relative: 2 %
Lymphs Abs: 0.3 10*3/uL — ABNORMAL LOW (ref 0.7–4.0)
MCH: 28.7 pg (ref 26.0–34.0)
MCHC: 32 g/dL (ref 30.0–36.0)
MCV: 89.7 fL (ref 80.0–100.0)
Monocytes Absolute: 0.8 10*3/uL (ref 0.1–1.0)
Monocytes Relative: 5 %
Neutro Abs: 15.2 10*3/uL — ABNORMAL HIGH (ref 1.7–7.7)
Neutrophils Relative %: 93 %
Platelets: 384 10*3/uL (ref 150–400)
RBC: 4.74 MIL/uL (ref 3.87–5.11)
RDW: 12.6 % (ref 11.5–15.5)
WBC: 16.4 10*3/uL — ABNORMAL HIGH (ref 4.0–10.5)
nRBC: 0 % (ref 0.0–0.2)

## 2019-09-06 LAB — COMPREHENSIVE METABOLIC PANEL
ALT: 15 U/L (ref 0–44)
AST: 18 U/L (ref 15–41)
Albumin: 3.9 g/dL (ref 3.5–5.0)
Alkaline Phosphatase: 75 U/L (ref 38–126)
Anion gap: 10 (ref 5–15)
BUN: 18 mg/dL (ref 8–23)
CO2: 27 mmol/L (ref 22–32)
Calcium: 9.2 mg/dL (ref 8.9–10.3)
Chloride: 104 mmol/L (ref 98–111)
Creatinine, Ser: 0.59 mg/dL (ref 0.44–1.00)
GFR calc Af Amer: >60 mL/min (ref >60)
GFR calc non Af Amer: >60 mL/min (ref >60)
Glucose, Bld: 133 mg/dL — ABNORMAL HIGH (ref 70–99)
Potassium: 3.4 mmol/L — ABNORMAL LOW (ref 3.5–5.1)
Sodium: 141 mmol/L (ref 135–145)
Total Bilirubin: 1 mg/dL (ref 0.3–1.2)
Total Protein: 6.5 g/dL (ref 6.5–8.1)

## 2019-09-06 MED ORDER — POTASSIUM CHLORIDE 20 MEQ PO PACK
40.0000 meq | PACK | Freq: Once | ORAL | Status: AC
  Administered 2019-09-06: 40 meq via ORAL
  Filled 2019-09-06: qty 2

## 2019-09-06 MED ORDER — FENTANYL CITRATE (PF) 100 MCG/2ML IJ SOLN
50.0000 ug | Freq: Once | INTRAMUSCULAR | Status: AC
  Administered 2019-09-06: 13:00:00 50 ug via INTRAVENOUS
  Filled 2019-09-06: qty 2

## 2019-09-06 MED ORDER — HYDROCHLOROTHIAZIDE 25 MG PO TABS
25.0000 mg | ORAL_TABLET | Freq: Every day | ORAL | Status: DC
  Administered 2019-09-06: 16:00:00 25 mg via ORAL
  Filled 2019-09-06: qty 1

## 2019-09-06 NOTE — ED Triage Notes (Signed)
EMS reports pt fell getting out of bed this morning approx 5 hours ago and spouse wasn't able to get her out of the floor.  They called ems.  Pt c/o pain to left arm.  Spouse gave pt 1 hydrocodone prior to ems arrival.  Reports pt has history of dementia.

## 2019-09-06 NOTE — Discharge Instructions (Addendum)
1. Medications: Home prescription of hydrocodone for pain, usual home medications 2. Treatment: rest, drink plenty of fluids, utilize sling for left arm 3. Follow Up: Please followup with Dr. Susa Simmonds for further evaluation of your fracture in approximately 1 week; Please return to the ER for worsening pain, additional falls or other concerns.
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
## 2019-09-07 ENCOUNTER — Telehealth: Payer: Self-pay | Admitting: *Deleted

## 2019-09-07 DIAGNOSIS — R296 Repeated falls: Secondary | ICD-10-CM

## 2019-09-07 DIAGNOSIS — F0391 Unspecified dementia with behavioral disturbance: Secondary | ICD-10-CM

## 2019-09-07 DIAGNOSIS — F03918 Unspecified dementia, unspecified severity, with other behavioral disturbance: Secondary | ICD-10-CM

## 2019-09-07 LAB — SARS CORONAVIRUS 2 (TAT 6-24 HRS): SARS Coronavirus 2: NEGATIVE

## 2019-09-07 NOTE — Telephone Encounter (Signed)
Health team advantage case worker called and stated she had just spoke with husband concerning patient's ER visit yesterday for fall and fracture. Patient has follow up wit ortho but patient has had numerous falls in last few months with 3 requiring ER visit and 2 causing fractures. They feel the patient needs home health assistance for gait training. Husband states it is hard to get her to get up and move and when she does it is hard to get her to lift her feet to take steps. Please advise

## 2019-09-07 NOTE — Telephone Encounter (Signed)
Referral ordered in EPIC. 

## 2019-09-07 NOTE — Telephone Encounter (Signed)
Sure lets order

## 2019-09-11 ENCOUNTER — Telehealth: Payer: Self-pay | Admitting: Family Medicine

## 2019-09-11 NOTE — Telephone Encounter (Signed)
Script written out awaiting signature. Pt husband Earl(DPR) is aware. Husband states that [place] they do not do Insurance claims handler but [facility]bie Lis says they do. Will fax script over to [facility] and see what we can get done. Pt husband verbalized understanding.

## 2019-09-11 NOTE — Telephone Encounter (Signed)
Lets do wis, that will start a process with sig paperwork , and sometime medicare says no, let husb know

## 2019-09-11 NOTE — Telephone Encounter (Signed)
Pt's husband calling to see if a script for a lift chair. With the recent falls shes had it is hard from him to help her get up and is hoping we can help in getting a lift chair for her.

## 2019-09-12 ENCOUNTER — Other Ambulatory Visit: Payer: Self-pay | Admitting: Family Medicine

## 2019-09-19 ENCOUNTER — Other Ambulatory Visit: Payer: Self-pay | Admitting: *Deleted

## 2019-09-19 ENCOUNTER — Encounter: Payer: Self-pay | Admitting: *Deleted

## 2019-09-19 NOTE — Patient Outreach (Signed)
Telephone outreach for HTA NP Referral.  Spoke to Nichole [name] and advised of NP role, pt advocacy, for HTA. Requested if he would consider allowing me to visit and we agreed on Thursday at 10:00 am.  Noralyn Pick C. [name], MSN, [facility] Gerontological Nurse Practitioner [facility] Care Management [phone]

## 2019-09-21 ENCOUNTER — Other Ambulatory Visit: Payer: Self-pay | Admitting: *Deleted

## 2019-09-21 ENCOUNTER — Ambulatory Visit (INDEPENDENT_AMBULATORY_CARE_PROVIDER_SITE_OTHER): Payer: PPO | Admitting: Family Medicine

## 2019-09-21 ENCOUNTER — Other Ambulatory Visit: Payer: Self-pay

## 2019-09-21 ENCOUNTER — Encounter: Payer: Self-pay | Admitting: Family Medicine

## 2019-09-21 DIAGNOSIS — R2681 Unsteadiness on feet: Secondary | ICD-10-CM

## 2019-09-21 DIAGNOSIS — F0391 Unspecified dementia with behavioral disturbance: Secondary | ICD-10-CM | POA: Diagnosis not present

## 2019-09-21 DIAGNOSIS — I10 Essential (primary) hypertension: Secondary | ICD-10-CM

## 2019-09-21 DIAGNOSIS — G894 Chronic pain syndrome: Secondary | ICD-10-CM

## 2019-09-21 DIAGNOSIS — F03918 Unspecified dementia, unspecified severity, with other behavioral disturbance: Secondary | ICD-10-CM

## 2019-09-21 MED ORDER — HYDROCODONE-ACETAMINOPHEN 5-325 MG PO TABS: ORAL_TABLET | ORAL | 0 refills | Status: DC
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
## 2019-09-21 NOTE — Progress Notes (Signed)
Subjective:  Audio only  Patient ID: Nichole [name], female    DOB: 1945-11-14, 74 y.o.   MRN: [id number]  HPI  This patient was seen today for chronic pain  The medication list was reviewed and updated.   -Compliance with medication: yes  - Number patient states they take daily: 2  -when was the last dose patient took? today  The patient was advised the importance of maintaining medication and not using illegal substances with these.  Here for refills and follow up  The patient was educated that we can provide 3 monthly scripts for their medication, it is their responsibility to follow the instructions.  Side effects or complications from medications: none  Patient is aware that pain medications are meant to minimize the severity of the pain to allow their pain levels to improve to allow for better function. They are aware of that pain medications cannot totally remove their pain.  Patient having balance issues and lots of falls- recent fall resulted in shoulder fracture  Virtual Visit via Video Note  I connected with Nichole [name] on 09/21/19 at  1:10 PM EST by a video enabled telemedicine application and verified that I am speaking with the correct person using two identifiers.  Location: Patient: home Provider: office   I discussed the limitations of evaluation and management by telemedicine and the availability of in person appointments. The patient expressed understanding and agreed to proceed.  History of Present Illness:    Observations/Objective:   Assessment and Plan:   Follow Up Instructions:    I discussed the assessment and treatment plan with the patient. The patient was provided an opportunity to ask questions and all were answered. The patient agreed with the plan and demonstrated an understanding of the instructions.   The patient was advised to call back or seek an in-person evaluation if the symptoms worsen or if the condition fails to  improve as anticipated.  I provided of non-face-to-face time during this encounter.  The visiting nurse practitioner took some vitals today and they are as follows.  160 over 64  98 per cent oxygen  Pulse 65  Lungs clear   Swelling ankles n feet 2     Patient continues to have substantial and frequent falls per her husband.  Emergency room visits are noted in this regard.  Her most recent follow-up involves a fracture of the upper humerus.  She is currently wearing a sling.  This was 2 weeks ago.  Patient continues to maintain her pain medication.  Husband monitors this.  Patient continues to have challenges with dementia.  Also substantial challenges with gait difficulties.  Unable to get up out of a chair or walk across the room without substantial assistance.  Patient's husband continues to be her sole caretaker despite her day and evening responsibilities.  See prior notes patient did have one episode of wandering.  Patient was visited by the nurse practitioner teach him today.  A very lengthy report was generated.  This is read and reviewed.  Physical therapy will be initiated.  Along with gait training.  Along with close follow-up from a social work standpoint.  The patient's nurse practitioner associated teaching pointed out her concerns regarding need for long-term placement and/or substantial up rehab and home support.  I have been discussing this with the patient and the patient's husband for the past year.  Review of Systems Stable back pain.  No shortness of breath    Objective:  Physical Exam   Virtual joint     Assessment & Plan:  Impression 1 frequent falls with neurological gait abnormality.  Patient's husband and patient have declined referral back to neurology stating that they really did not help.  Progressive difficulties.  Hopefully PT and OT assistance will provide an element of support  2.  Social situation very challenging.  I think the  husband appears to mean well but I am not sure if he is up to the task of taking care of this increasingly needing full patient day and night.  Once again potential for needing nursing home level support discussed and encouraged patient to thoughtfully considered.  Also social services and potential Medicaid qualification discussed.  3.  Insurance visit by nurse practitioner also reviewed.  Physical therapy signed off on and ordered.  4.  COPD clinically stable per husband  5.  Humerus fracture discussed to see orthopedic surgeons  Follow-up as scheduled.  Pain medications refilled.  Greater than 50% of this 40 minute face to face visit was spent in counseling and discussion and coordination of care regarding the above diagnosis/diagnosies

## 2019-09-22 ENCOUNTER — Telehealth: Payer: Self-pay | Admitting: *Deleted

## 2019-09-22 ENCOUNTER — Ambulatory Visit: Payer: PPO | Admitting: Family Medicine

## 2019-09-22 ENCOUNTER — Other Ambulatory Visit: Payer: Self-pay | Admitting: *Deleted

## 2019-09-22 NOTE — Telephone Encounter (Signed)
Definitely lets do
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
## 2019-09-22 NOTE — Telephone Encounter (Signed)
[name] at [facility] calling because seh states Nichole [name] needs home heatlh nursing, Nichole [name], ot and possibly an aid. She spoke with niki at [facility] home health who told her they would take her. Nichole [name] has dementia, alzheimer's, muscle weakness and a hard time using her [place].  They need a referral if dr Brett Canales agrees. [facility] number [phone] and fax number [phone].

## 2019-09-25 ENCOUNTER — Telehealth: Payer: Self-pay | Admitting: Family Medicine

## 2019-09-25 DIAGNOSIS — J209 Acute bronchitis, unspecified: Secondary | ICD-10-CM | POA: Diagnosis not present

## 2019-09-25 DIAGNOSIS — J45909 Unspecified asthma, uncomplicated: Secondary | ICD-10-CM | POA: Diagnosis not present

## 2019-09-25 NOTE — Telephone Encounter (Signed)
ok 

## 2019-09-25 NOTE — Telephone Encounter (Signed)
Nichole [name] with [facility] called to confirm they received referral for home health  States they will do PT & go out to see patient 10/01/2018 per husbands request  They have a home health nurse from [facility] coming out to check on pt so only need PT from [facility]

## 2019-09-25 NOTE — Patient Outreach (Signed)
[name] gave permission to speak to his son and daughter-in-law next door. Called and spoke to [name], daughter-in-law as her husband was not available.  Advised reason for call is to request additional support from them to assist [name]. Request some time to assist with [name]'s personal hygiene and to sit with her to allow [name] some time away from the home.  [name] was very interested in helping and appreciate advise on how to go about providing it. Advised several services and professionals will be calling to assist ([facility], [facility], PT). Provided my information for further questions.  [name]. [name], MSN, [facility] Gerontological Nurse Practitioner [facility] Care Management [phone]

## 2019-09-25 NOTE — Telephone Encounter (Signed)
Faxed to [facility].

## 2019-09-28 ENCOUNTER — Other Ambulatory Visit: Payer: Self-pay | Admitting: *Deleted

## 2019-09-28 NOTE — Patient Outreach (Signed)
Telephone out reach to follow up on Nichole [name] and services that have initiated care.  No answer at this time. I have left a message and requested a return call.  [name]. [name], MSN, [facility] Gerontological Nurse Practitioner [facility] Care Management [phone]

## 2019-09-29 DIAGNOSIS — M25512 Pain in left shoulder: Secondary | ICD-10-CM | POA: Diagnosis not present

## 2019-09-29 DIAGNOSIS — S42292A Other displaced fracture of upper end of left humerus, initial encounter for closed fracture: Secondary | ICD-10-CM | POA: Diagnosis not present

## 2019-09-29 NOTE — Patient Outreach (Signed)
09/29/19 Called again and did talk to Mr. Transue briefly he reports some services have started and they are going out the door to have a follow up appt with Mrs. Chimenti's orthopedist. He states he will call me later.  [name]. [name], MSN, [facility] Gerontological Nurse Practitioner [facility] Care Management [phone]

## 2019-10-10 ENCOUNTER — Encounter: Payer: Self-pay | Admitting: *Deleted

## 2019-10-11 ENCOUNTER — Other Ambulatory Visit: Payer: Self-pay | Admitting: *Deleted

## 2019-10-11 DIAGNOSIS — F0281 Dementia in other diseases classified elsewhere with behavioral disturbance: Secondary | ICD-10-CM | POA: Diagnosis not present

## 2019-10-11 DIAGNOSIS — M6281 Muscle weakness (generalized): Secondary | ICD-10-CM | POA: Diagnosis not present

## 2019-10-11 DIAGNOSIS — Z87891 Personal history of nicotine dependence: Secondary | ICD-10-CM | POA: Diagnosis not present

## 2019-10-11 DIAGNOSIS — R2681 Unsteadiness on feet: Secondary | ICD-10-CM | POA: Diagnosis not present

## 2019-10-11 DIAGNOSIS — G300 Alzheimer's disease with early onset: Secondary | ICD-10-CM | POA: Diagnosis not present

## 2019-10-11 DIAGNOSIS — I10 Essential (primary) hypertension: Secondary | ICD-10-CM | POA: Diagnosis not present

## 2019-10-11 DIAGNOSIS — J449 Chronic obstructive pulmonary disease, unspecified: Secondary | ICD-10-CM | POA: Diagnosis not present

## 2019-10-11 DIAGNOSIS — S42212D Unspecified displaced fracture of surgical neck of left humerus, subsequent encounter for fracture with routine healing: Secondary | ICD-10-CM | POA: Diagnosis not present

## 2019-10-11 DIAGNOSIS — S32001D Stable burst fracture of unspecified lumbar vertebra, subsequent encounter for fracture with routine healing: Secondary | ICD-10-CM | POA: Diagnosis not present

## 2019-10-11 DIAGNOSIS — M25512 Pain in left shoulder: Secondary | ICD-10-CM | POA: Diagnosis not present

## 2019-10-11 NOTE — Patient Outreach (Signed)
[facility]) Care Management  10/11/2019  Nichole [name] 21-Nov-1945 [id number]   Follow up acute home visit.  Visiting again today to verify pt safety status as other services have not been able to be scheduled per husbands' confusion of multiple servicies and who is doing what. Husband agreed to allow me a second visit.   [facility], Nichole [name] also scheduled to make her first visit at approximately the same time.  Much improved impression of pt condition and safety today. She is up ambulating with a Prochazka. Her sling was discontinued. She can push off the arm rests of her low sitting recliner and stand up to her [place] and ambulate with this better than expected.  She is conversant today. Remains with disheveled hair. No body odor.  Mr. Dike is present and provides a good hx. We discussed his health also today. He is a throat ca survivor. He has a hx of AFIB, Gout, hyperlipidemia, DM, Depression and Anxiety. He does not have current health issues that are of great concern to him at this time except for caregiver burnout which is better since Nichole [name] can ambulate and go to the bathroom now.  The PT arrived and started interviewing Nichole [name] so Nichole [name] and I stepped into another room and I shared I am so much more comfortable with her safety now. I shared that I was VERY concerned when I last visited. Advised he must make a plan for when Nichole [name] dementia progresses and he needs more care or needs to place her. Advised there are private caregivers in the area that could give him relief.  Requested that PT collaborate with me on our next steps. She communicated to me her plan to visit 2 X a week for 3 weeks. She agreed she felt the environment was safe.  I will not make an APS referral as I was previoiusly contemplating. However, as Nichole [name]'s condition progresses or she has any other injury due to a fall this will certainly be a need for assessment.  Also  advised Nichole [name] that [facility] will be coming and he already has the appt with their NP on the calendar!  Advised I will probably not see them again but that I am available if needed.  [name]. [name], MSN, [facility] Gerontological Nurse Practitioner [facility] Care Management [phone]

## 2019-10-17 DIAGNOSIS — Z87891 Personal history of nicotine dependence: Secondary | ICD-10-CM | POA: Diagnosis not present

## 2019-10-17 DIAGNOSIS — J449 Chronic obstructive pulmonary disease, unspecified: Secondary | ICD-10-CM | POA: Diagnosis not present

## 2019-10-17 DIAGNOSIS — M6281 Muscle weakness (generalized): Secondary | ICD-10-CM | POA: Diagnosis not present

## 2019-10-17 DIAGNOSIS — S32001D Stable burst fracture of unspecified lumbar vertebra, subsequent encounter for fracture with routine healing: Secondary | ICD-10-CM | POA: Diagnosis not present

## 2019-10-17 DIAGNOSIS — S42212D Unspecified displaced fracture of surgical neck of left humerus, subsequent encounter for fracture with routine healing: Secondary | ICD-10-CM | POA: Diagnosis not present

## 2019-10-17 DIAGNOSIS — M25512 Pain in left shoulder: Secondary | ICD-10-CM | POA: Diagnosis not present

## 2019-10-17 DIAGNOSIS — G300 Alzheimer's disease with early onset: Secondary | ICD-10-CM | POA: Diagnosis not present

## 2019-10-17 DIAGNOSIS — F0281 Dementia in other diseases classified elsewhere with behavioral disturbance: Secondary | ICD-10-CM | POA: Diagnosis not present

## 2019-10-17 DIAGNOSIS — R2681 Unsteadiness on feet: Secondary | ICD-10-CM | POA: Diagnosis not present

## 2019-10-17 DIAGNOSIS — I10 Essential (primary) hypertension: Secondary | ICD-10-CM | POA: Diagnosis not present

## 2019-10-20 DIAGNOSIS — M25512 Pain in left shoulder: Secondary | ICD-10-CM | POA: Diagnosis not present

## 2019-10-23 ENCOUNTER — Other Ambulatory Visit: Payer: Self-pay | Admitting: *Deleted

## 2019-10-23 DIAGNOSIS — J45909 Unspecified asthma, uncomplicated: Secondary | ICD-10-CM | POA: Diagnosis not present

## 2019-10-23 DIAGNOSIS — J209 Acute bronchitis, unspecified: Secondary | ICD-10-CM | POA: Diagnosis not present

## 2019-10-23 NOTE — Patient Outreach (Signed)
Telephone assessment, follow up [facility] NP referral.  Talked with [name] this am. He reports that Mrs. Nichole [name] is improving with her therapy and this is continuing. They had a visit with Nichole Cower, NP, from [facility] last week. This was his initial visit. [name] could not provide much detail about what their plan of care will be. Advised that he will be working with them at least for awhile. Advised that he talk with Nichole [name] specifically about the plan of care and frequency of visits and get them written down on his calendar.   Discussed the plan for if Mrs. Nichole [name] has another set back and needs additional care whether it would be in home or at a facility. [name] has not thought about this, although I had requested that this will be an inevitable event. He says he is still overwhelmed with what is going on now and just wants to see how things play out with the services Mrs. Nichole [name] is getting at present.  Will send a message to [facility] suggesting that this be a goal to complete during their active time.   I will sign off of this case at this time. I did remind [name] he is welcome to call me with questions or problems.  Nichole [name]. [name], MSN, [facility] Gerontological Nurse Practitioner [facility] Care Management [phone]

## 2019-11-14 ENCOUNTER — Other Ambulatory Visit: Payer: Self-pay | Admitting: *Deleted

## 2019-11-14 DIAGNOSIS — M8580 Other specified disorders of bone density and structure, unspecified site: Secondary | ICD-10-CM

## 2019-11-14 DIAGNOSIS — Z780 Asymptomatic menopausal state: Secondary | ICD-10-CM

## 2019-11-14 DIAGNOSIS — Z8781 Personal history of (healed) traumatic fracture: Secondary | ICD-10-CM

## 2019-11-22 ENCOUNTER — Other Ambulatory Visit (HOSPITAL_COMMUNITY): Payer: PPO

## 2019-11-23 ENCOUNTER — Ambulatory Visit (HOSPITAL_COMMUNITY)
Admission: RE | Admit: 2019-11-23 | Discharge: 2019-11-23 | Disposition: A | Discharge status: Home / Self Care | Payer: PPO | Source: Ambulatory Visit | Attending: Family Medicine | Admitting: Family Medicine

## 2019-11-23 ENCOUNTER — Other Ambulatory Visit: Payer: Self-pay

## 2019-11-23 DIAGNOSIS — J45909 Unspecified asthma, uncomplicated: Secondary | ICD-10-CM | POA: Diagnosis not present

## 2019-11-23 DIAGNOSIS — M8580 Other specified disorders of bone density and structure, unspecified site: Secondary | ICD-10-CM | POA: Insufficient documentation

## 2019-11-23 DIAGNOSIS — Z8781 Personal history of (healed) traumatic fracture: Secondary | ICD-10-CM | POA: Diagnosis not present

## 2019-11-23 DIAGNOSIS — Z780 Asymptomatic menopausal state: Secondary | ICD-10-CM | POA: Insufficient documentation

## 2019-11-23 DIAGNOSIS — M85851 Other specified disorders of bone density and structure, right thigh: Secondary | ICD-10-CM | POA: Diagnosis not present

## 2019-11-23 DIAGNOSIS — J209 Acute bronchitis, unspecified: Secondary | ICD-10-CM | POA: Diagnosis not present

## 2019-11-28 ENCOUNTER — Ambulatory Visit (INDEPENDENT_AMBULATORY_CARE_PROVIDER_SITE_OTHER): Payer: PPO | Admitting: Family Medicine

## 2019-11-28 ENCOUNTER — Encounter: Payer: Self-pay | Admitting: Family Medicine

## 2019-11-28 ENCOUNTER — Other Ambulatory Visit: Payer: Self-pay

## 2019-11-28 ENCOUNTER — Other Ambulatory Visit: Payer: Self-pay | Admitting: Family Medicine

## 2019-11-28 VITALS — BP 132/84 | Temp 94.6°F | Wt 144.0 lb

## 2019-11-28 DIAGNOSIS — F329 Major depressive disorder, single episode, unspecified: Secondary | ICD-10-CM

## 2019-11-28 DIAGNOSIS — F32A Depression, unspecified: Secondary | ICD-10-CM

## 2019-11-28 MED ORDER — BUPROPION HCL ER (XL) 150 MG PO TB24: ORAL_TABLET | ORAL | 1 refills | Status: DC

## 2019-11-28 NOTE — Telephone Encounter (Signed)
Ok plus 6 ref 

## 2019-11-28 NOTE — Progress Notes (Signed)
   Subjective:    Patient ID: Nichole [name], female    DOB: 05-Oct-1945, 74 y.o.   MRN: [id number]  HPI Nurse practitioner came by pt home on 11/22/19. They are due to come back for another home visit end of May.    Provider wanted patient to come into office.   Patient brought in along with her husband after discussion with one of the home health conditions.  Nurse practitioner and discussed with the patient and noted how she refers to potentially hurting herself.  Today the patient states that she would not hurt herself, but has thought about it in the past.  States she just does not feel she is any good to anyone anymore, and is worried about the burden that is following up on her husband as she slides further into dementia  There is also considerable concern expressed on the part of the home health nurse practitioner about the ability of the spouse to support the ever-increasing needs of the patient at home  They recommended and were going to set up a home social worker consultation.  Review of Systems No headache no chest pain no shortness of breath    Objective:   Physical Exam  Alert oriented x1 no acute distress lungs clear today heart regular rate and rhythm blood pressure good on      Assessment & Plan:  Impression mood disorder patient already on Wellbutrin plus Lexapro. She is expressing ideation of self-harm and suicide. Very long discussion held. I recommend psychiatry evaluation for management of her depression and further assessment of her statements. I also encouraged her husband to remove any guns from the household.  2. Challenging social situation. Patient's husband has been very reluctant to consider placement. He has numerous times expressed financial concern about the high cost of this. Social worker will be working with the family in this regard. Once again I advised the family there is a high likelihood of one-point [name] will no longer be able to be cared  for at home and we may well approach this point or are approaching a quickly  Greater than 50% of this 35 minute face to face visit was spent in counseling and discussion and coordination of care regarding the above diagnosis/diagnosies

## 2019-11-30 ENCOUNTER — Encounter: Payer: Self-pay | Admitting: Family Medicine

## 2019-12-08 ENCOUNTER — Other Ambulatory Visit: Payer: Self-pay | Admitting: Family Medicine

## 2019-12-21 ENCOUNTER — Ambulatory Visit: Payer: PPO | Admitting: Family Medicine

## 2019-12-23 DIAGNOSIS — J45909 Unspecified asthma, uncomplicated: Secondary | ICD-10-CM | POA: Diagnosis not present

## 2019-12-23 DIAGNOSIS — J209 Acute bronchitis, unspecified: Secondary | ICD-10-CM | POA: Diagnosis not present

## 2020-01-23 DIAGNOSIS — J209 Acute bronchitis, unspecified: Secondary | ICD-10-CM | POA: Diagnosis not present

## 2020-01-23 DIAGNOSIS — J45909 Unspecified asthma, uncomplicated: Secondary | ICD-10-CM | POA: Diagnosis not present

## 2020-01-26 ENCOUNTER — Other Ambulatory Visit: Payer: Self-pay | Admitting: Family Medicine

## 2020-01-26 NOTE — Telephone Encounter (Signed)
Scheduled 6.18

## 2020-01-29 ENCOUNTER — Other Ambulatory Visit: Payer: Self-pay | Admitting: Family Medicine

## 2020-01-29 NOTE — Telephone Encounter (Signed)
Appt on 6/18 and this med was already refused today but I am unable to refuse it since its a controlled med

## 2020-02-02 ENCOUNTER — Encounter: Payer: Self-pay | Admitting: Family Medicine

## 2020-02-02 ENCOUNTER — Other Ambulatory Visit: Payer: Self-pay

## 2020-02-02 ENCOUNTER — Ambulatory Visit (INDEPENDENT_AMBULATORY_CARE_PROVIDER_SITE_OTHER): Payer: PPO | Admitting: Family Medicine

## 2020-02-02 VITALS — BP 116/68 | HR 86 | Temp 97.6°F | Wt 135.0 lb

## 2020-02-02 DIAGNOSIS — M545 Low back pain, unspecified: Secondary | ICD-10-CM

## 2020-02-02 DIAGNOSIS — F0280 Dementia in other diseases classified elsewhere without behavioral disturbance: Secondary | ICD-10-CM

## 2020-02-02 DIAGNOSIS — G8929 Other chronic pain: Secondary | ICD-10-CM

## 2020-02-02 DIAGNOSIS — G308 Other Alzheimer's disease: Secondary | ICD-10-CM

## 2020-02-02 DIAGNOSIS — S32001S Stable burst fracture of unspecified lumbar vertebra, sequela: Secondary | ICD-10-CM

## 2020-02-02 MED ORDER — HYDROCODONE-ACETAMINOPHEN 5-325 MG PO TABS: ORAL_TABLET | ORAL | 0 refills | Status: DC

## 2020-02-02 NOTE — Progress Notes (Signed)
Patient ID: Nichole [name], female    DOB: 1946-05-17, 74 y.o.   MRN: [id number]   Chief Complaint  Patient presents with  . Follow-up   Subjective:    HPI  This patient was seen today for chronic pain.  Seen with husband/caretaker.  The medication list was reviewed and updated.   -Compliance with medication: yes  - Number patient states they take daily: 2 per day, husband states if she doesn't need it in the morning she may only take 1 per day  -when was the last dose patient took? This morning.  The patient was advised the importance of maintaining medication and not using illegal substances with these.  Here for refills and follow up  The patient was educated that we can provide 3 monthly scripts for their medication, it is their responsibility to follow the instructions.  Side effects or complications from medications: none  Patient is aware that pain medications are meant to minimize the severity of the pain to allow their pain levels to improve to allow for better function. They are aware of that pain medications cannot totally remove their pain.  - pt is having "pain all over."  - pt stating, has pain all the time. - low back pain some times. - lots of back surgeries. Husband gives her meds, occ tries to only give 1 tab per day.   Last fill for 60 tab norco on 12/27/19.   Medical History Kariya has a past medical history of Anxiety, Asthma, Colon polyp, COPD (chronic obstructive pulmonary disease) ([place]), Dementia ([place]), Depression, Diverticular disease, Hyperlipidemia, Hypertension, IFG (impaired fasting glucose), Memory loss, and Mild sleep apnea.   Outpatient Encounter Medications as of 02/02/2020  Medication Sig  . albuterol (PROAIR HFA) 108 (90 Base) MCG/ACT inhaler Inhale 2 puffs into the lungs every 6 (six) hours as needed. for wheezing (Patient not taking: Reported on 02/02/2020)  . buPROPion (WELLBUTRIN XL) 150 MG 24 hr tablet Take one tablet po daily  .  diltiazem (CARDIZEM CD) 240 MG 24 hr capsule TAKE (1) CAPSULE BY MOUTH ONCE DAILY.  Marland Kitchen donepezil (ARICEPT) 10 MG tablet TAKE ONE TABLET EVERY MORNING.  Marland Kitchen doxazosin (CARDURA) 2 MG tablet TAKE (1) TABLET BY MOUTH ONCE DAILY.  Marland Kitchen escitalopram (LEXAPRO) 20 MG tablet TAKE (1) TABLET BY MOUTH ONCE DAILY.  . hydrochlorothiazide (HYDRODIURIL) 25 MG tablet TAKE (1) TABLET BY MOUTH ONCE DAILY.  Marland Kitchen HYDROcodone-acetaminophen (NORCO/VICODIN) 5-325 MG tablet TAKE (1/2) TABLET BY MOUTH TWICE DAILY AS NEEDED FOR MODERATE PAIN.  . meclizine (ANTIVERT) 25 MG tablet Take one tablet tid prn dizziness (Patient taking differently: Take 25 mg by mouth 3 (three) times daily as needed for dizziness. )  . meloxicam (MOBIC) 7.5 MG tablet TAKE (1) TABLET BY MOUTH ONCE DAILY.  Marland Kitchen oxybutynin (DITROPAN-XL) 10 MG 24 hr tablet TAKE (1) TABLET BY MOUTH AT BEDTIME. (Patient taking differently: Take 10 mg by mouth at bedtime. )  . [DISCONTINUED] HYDROcodone-acetaminophen (NORCO/VICODIN) 5-325 MG tablet TAKE (1) TABLET BY MOUTH TWICE DAILY AS NEEDED FOR MODERATE PAIN.  . [DISCONTINUED] HYDROcodone-acetaminophen (NORCO/VICODIN) 5-325 MG tablet TAKE (1) TABLET BY MOUTH TWICE DAILY AS NEEDED FOR MODERATE PAIN  . [DISCONTINUED] HYDROcodone-acetaminophen (NORCO/VICODIN) 5-325 MG tablet TAKE (1) TABLET BY MOUTH TWICE DAILY AS NEEDED FOR MODERATE PAIN.,   No facility-administered encounter medications on file as of 02/02/2020.     Review of Systems  Constitutional: Negative for chills and fever.  HENT: Negative for congestion, rhinorrhea and sore throat.   Respiratory:  Negative for cough, shortness of breath and wheezing.   Cardiovascular: Negative for chest pain and leg swelling.  Gastrointestinal: Negative for abdominal pain, blood in stool (resolved), diarrhea, nausea and vomiting.  Genitourinary: Negative for difficulty urinating, dysuria, flank pain, frequency and hematuria.  Musculoskeletal: Positive for back pain (chronic back pain).  Negative for arthralgias.  Skin: Negative for rash.  Neurological: Negative for dizziness, weakness and headaches.  Psychiatric/Behavioral: Negative for agitation, behavioral problems, confusion, dysphoric mood, self-injury, sleep disturbance and suicidal ideas. The patient is not nervous/anxious and is not hyperactive.        +h/o dementia     Vitals BP 116/68   Pulse 86   Temp 97.6 F (36.4 C)   Wt 135 lb (61.2 kg)   SpO2 95%   BMI 22.47 kg/m   Objective:   Physical Exam Vitals and nursing note reviewed.  Constitutional:      General: She is not in acute distress.    Appearance: Normal appearance. She is not ill-appearing.  HENT:     Head: Normocephalic and atraumatic.     Nose: Nose normal.     Mouth/Throat:     Mouth: Mucous membranes are moist.     Pharynx: Oropharynx is clear.  Eyes:     Extraocular Movements: Extraocular movements intact.     Conjunctiva/sclera: Conjunctivae normal.     Pupils: Pupils are equal, round, and reactive to light.  Cardiovascular:     Rate and Rhythm: Normal rate and regular rhythm.     Pulses: Normal pulses.     Heart sounds: Normal heart sounds.  Pulmonary:     Effort: Pulmonary effort is normal.     Breath sounds: Normal breath sounds. No wheezing, rhonchi or rales.  Musculoskeletal:        General: Normal range of motion.     Right lower leg: No edema.     Left lower leg: No edema.     Comments: Lumbar exam- surgical scar midline, no rash, erythema or ecchymosis.  Skin:    General: Skin is warm and dry.     Findings: No bruising, erythema, lesion or rash.  Neurological:     General: No focal deficit present.     Mental Status: She is alert and oriented to person, place, and time.     Cranial Nerves: No cranial nerve deficit.     Motor: No weakness.     Gait: Gait normal.  Psychiatric:        Mood and Affect: Mood normal.        Behavior: Behavior normal.     Comments: Oriented to person only.      Assessment and  Plan   1. Alzheimer's disease of other onset without behavioral disturbance (HCC)  2. Closed burst fracture of lumbar vertebra, sequela - HYDROcodone-acetaminophen (NORCO/VICODIN) 5-325 MG tablet; TAKE (1/2) TABLET BY MOUTH TWICE DAILY AS NEEDED FOR MODERATE PAIN.  Dispense: 30 tablet; Refill: 0  3. Chronic bilateral low back pain without sciatica - HYDROcodone-acetaminophen (NORCO/VICODIN) 5-325 MG tablet; TAKE (1/2) TABLET BY MOUTH TWICE DAILY AS NEEDED FOR MODERATE PAIN.  Dispense: 30 tablet; Refill: 0    - pt and husband to call back in 2-4 wks and let us know about pain meds.  - will call back if unable to take just 1/2 tab bid of norco.  Husband to call back.  May need to go back up to 1 tab p.o. bid.  Has about 10 tablets left of norco in  their bottle. -advising to try the mobic daily and 500mg  tylenol prn for pain. Pt and husband in agreement with plan.  Reviewed need for pt to take the aricept.  Looked like they weren't taking it daily.  F/u 83months or prn.

## 2020-02-08 ENCOUNTER — Other Ambulatory Visit: Payer: Self-pay | Admitting: Family Medicine

## 2020-02-08 DIAGNOSIS — G8929 Other chronic pain: Secondary | ICD-10-CM

## 2020-02-08 DIAGNOSIS — M545 Low back pain, unspecified: Secondary | ICD-10-CM

## 2020-02-08 DIAGNOSIS — S32001S Stable burst fracture of unspecified lumbar vertebra, sequela: Secondary | ICD-10-CM

## 2020-02-08 NOTE — Telephone Encounter (Signed)
Last filled 02/02/2020- Left message to return call to ask about early refill request

## 2020-02-12 ENCOUNTER — Telehealth: Payer: Self-pay | Admitting: *Deleted

## 2020-02-12 NOTE — Telephone Encounter (Signed)
Spoke to pt husband and advised him 30 tablets were sent in on 6/18, he will contact pharmacy and call us if he has any questions.
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
## 2020-02-16 NOTE — Telephone Encounter (Signed)
Spoke to Dr. [name] and unnecessary to contact anyone about this.

## 2020-02-20 ENCOUNTER — Ambulatory Visit: Payer: PPO | Admitting: Family Medicine

## 2020-02-22 DIAGNOSIS — J45909 Unspecified asthma, uncomplicated: Secondary | ICD-10-CM | POA: Diagnosis not present

## 2020-02-22 DIAGNOSIS — J209 Acute bronchitis, unspecified: Secondary | ICD-10-CM | POA: Diagnosis not present

## 2020-03-24 DIAGNOSIS — J45909 Unspecified asthma, uncomplicated: Secondary | ICD-10-CM | POA: Diagnosis not present

## 2020-03-24 DIAGNOSIS — J209 Acute bronchitis, unspecified: Secondary | ICD-10-CM | POA: Diagnosis not present

## 2020-04-08 ENCOUNTER — Other Ambulatory Visit: Payer: Self-pay | Admitting: Family Medicine

## 2020-04-08 DIAGNOSIS — S32001S Stable burst fracture of unspecified lumbar vertebra, sequela: Secondary | ICD-10-CM

## 2020-04-08 DIAGNOSIS — G8929 Other chronic pain: Secondary | ICD-10-CM

## 2020-04-08 DIAGNOSIS — M545 Low back pain, unspecified: Secondary | ICD-10-CM

## 2020-04-09 ENCOUNTER — Other Ambulatory Visit: Payer: Self-pay | Admitting: Family Medicine

## 2020-04-12 ENCOUNTER — Other Ambulatory Visit: Payer: Self-pay | Admitting: Family Medicine

## 2020-04-24 DIAGNOSIS — J45909 Unspecified asthma, uncomplicated: Secondary | ICD-10-CM | POA: Diagnosis not present

## 2020-04-24 DIAGNOSIS — J209 Acute bronchitis, unspecified: Secondary | ICD-10-CM | POA: Diagnosis not present

## 2020-04-30 ENCOUNTER — Other Ambulatory Visit: Payer: Self-pay

## 2020-04-30 ENCOUNTER — Ambulatory Visit (INDEPENDENT_AMBULATORY_CARE_PROVIDER_SITE_OTHER): Payer: PPO | Admitting: Family Medicine

## 2020-04-30 VITALS — BP 130/88 | HR 66 | Temp 97.8°F | Wt 129.4 lb

## 2020-04-30 DIAGNOSIS — M545 Low back pain, unspecified: Secondary | ICD-10-CM

## 2020-04-30 DIAGNOSIS — F0280 Dementia in other diseases classified elsewhere without behavioral disturbance: Secondary | ICD-10-CM | POA: Diagnosis not present

## 2020-04-30 DIAGNOSIS — G8929 Other chronic pain: Secondary | ICD-10-CM | POA: Diagnosis not present

## 2020-04-30 DIAGNOSIS — I10 Essential (primary) hypertension: Secondary | ICD-10-CM | POA: Diagnosis not present

## 2020-04-30 DIAGNOSIS — G308 Other Alzheimer's disease: Secondary | ICD-10-CM | POA: Diagnosis not present

## 2020-04-30 DIAGNOSIS — S32001S Stable burst fracture of unspecified lumbar vertebra, sequela: Secondary | ICD-10-CM

## 2020-04-30 MED ORDER — HYDROCHLOROTHIAZIDE 25 MG PO TABS: ORAL_TABLET | ORAL | 5 refills | Status: DC

## 2020-04-30 MED ORDER — HYDROCODONE-ACETAMINOPHEN 5-325 MG PO TABS: ORAL_TABLET | ORAL | 0 refills | Status: DC

## 2020-04-30 MED ORDER — DILTIAZEM HCL ER COATED BEADS 240 MG PO CP24: ORAL_CAPSULE | ORAL | 1 refills | Status: DC

## 2020-04-30 MED ORDER — DOXAZOSIN MESYLATE 2 MG PO TABS: ORAL_TABLET | ORAL | 5 refills | Status: AC

## 2020-04-30 MED ORDER — DONEPEZIL HCL 10 MG PO TABS: ORAL_TABLET | ORAL | 1 refills | Status: DC

## 2020-04-30 MED ORDER — BUPROPION HCL ER (XL) 150 MG PO TB24: ORAL_TABLET | ORAL | 1 refills | Status: DC

## 2020-04-30 MED ORDER — ESCITALOPRAM OXALATE 20 MG PO TABS: ORAL_TABLET | ORAL | 5 refills | Status: AC

## 2020-04-30 MED ORDER — OXYBUTYNIN CHLORIDE ER 10 MG PO TB24: ORAL_TABLET | ORAL | 1 refills | Status: DC

## 2020-04-30 NOTE — Patient Instructions (Signed)
Results for orders placed or performed during the hospital encounter of 09/06/19  SARS CORONAVIRUS 2 (TAT 6-24 HRS) Nasopharyngeal Nasopharyngeal Swab   Specimen: Nasopharyngeal Swab  Result Value Ref Range   SARS Coronavirus 2 NEGATIVE NEGATIVE  CBC with Differential  Result Value Ref Range   WBC 16.4 (H) 4.0 - 10.5 K/uL   RBC 4.74 3.87 - 5.11 MIL/uL   Hemoglobin 13.6 12.0 - 15.0 g/dL   HCT 35.3 36 - 46 %   MCV 89.7 80.0 - 100.0 fL   MCH 28.7 26.0 - 34.0 pg   MCHC 32.0 30.0 - 36.0 g/dL   RDW 29.9 24.2 - 68.3 %   Platelets 384 150 - 400 K/uL   nRBC 0.0 0.0 - 0.2 %   Neutrophils Relative % 93 %   Neutro Abs 15.2 (H) 1.7 - 7.7 K/uL   Lymphocytes Relative 2 %   Lymphs Abs 0.3 (L) 0.7 - 4.0 K/uL   Monocytes Relative 5 %   Monocytes Absolute 0.8 0 - 1 K/uL   Eosinophils Relative 0 %   Eosinophils Absolute 0.0 0 - 0 K/uL   Basophils Relative 0 %   Basophils Absolute 0.0 0 - 0 K/uL   Immature Granulocytes 0 %   Abs Immature Granulocytes 0.07 0.00 - 0.07 K/uL  Comprehensive metabolic panel  Result Value Ref Range   Sodium 141 135 - 145 mmol/L   Potassium 3.4 (L) 3.5 - 5.1 mmol/L   Chloride 104 98 - 111 mmol/L   CO2 27 22 - 32 mmol/L   Glucose, Bld 133 (H) 70 - 99 mg/dL   BUN 18 8 - 23 mg/dL   Creatinine, Ser 4.19 0.44 - 1.00 mg/dL   Calcium 9.2 8.9 - 62.2 mg/dL   Total Protein 6.5 6.5 - 8.1 g/dL   Albumin 3.9 3.5 - 5.0 g/dL   AST 18 15 - 41 U/L   ALT 15 0 - 44 U/L   Alkaline Phosphatase 75 38 - 126 U/L   Total Bilirubin 1.0 0.3 - 1.2 mg/dL   GFR calc non Af Amer >60 >60 mL/min   GFR calc Af Amer >60 >60 mL/min   Anion gap 10 5 - 15

## 2020-04-30 NOTE — Progress Notes (Signed)
Patient ID: Nichole [name], female    DOB: Oct 26, 1945, 74 y.o.   MRN: [id number]   Chief Complaint  Patient presents with  . pain management    follow up- Nichole [name] states he is unable to take care of her anymore and was advised to discuss with PCP    Subjective:    HPI   Pt seen today with Nichole [name]. Here to f/u on chronic pain in low back and dementia concerns.  Doing well on hydrocodone -1/2 twice a day, per Nichole [name]. Pt seen with Nichole [name] today and pt has dec the pain medication down to 1/2 tab bid. Not complaining of more pain. Pt is hard time walking. Has some falls at home still, but has improved since last visit and since dec in pain medications. Was falling more with getting out of bed or getting up from sitting. Has Nichole [name] and cane at home. Hard to use in the house. 4 point cane might be catching her up. Not taking mobic for pain at this time.  Per Nichole [name]- this past week social services got involved. Wanting to try to find a place for placement for patient. They are needing a FL2 form. For assisted living facility. Nichole [name]. At social services.   [phone], [phone] Pt brought letter about her condition.  Okay from Nichole [name] to give records release to the social services or living facility. Not wanting it to go to other family members.  Nichole [name] stating unable to afford her to go to nursing home. If pt goes on medicaid can cover better.   Medical History Nichole [name] has a past medical history of Anxiety, Asthma, Colon polyp, COPD (chronic obstructive pulmonary disease) (HCC), Dementia (HCC), Depression, Diverticular disease, Hyperlipidemia, Hypertension, IFG (impaired fasting glucose), Memory loss, and Mild sleep apnea.   Outpatient Encounter Medications as of 04/30/2020  Medication Sig  . albuterol (PROAIR HFA) 108 (90 Base) MCG/ACT inhaler Inhale 2 puffs into the lungs every 6 (six) hours as needed. for wheezing (Patient not taking: Reported on 02/02/2020)  .  buPROPion (WELLBUTRIN XL) 150 MG 24 hr tablet Take one tablet po daily  . diltiazem (CARDIZEM CD) 240 MG 24 hr capsule TAKE (1) CAPSULE BY MOUTH ONCE DAILY.  Marland Kitchen donepezil (ARICEPT) 10 MG tablet TAKE (1) TABLET BY MOUTH EACH MORNING.  Marland Kitchen doxazosin (CARDURA) 2 MG tablet TAKE (1) TABLET BY MOUTH ONCE DAILY.  Marland Kitchen escitalopram (LEXAPRO) 20 MG tablet TAKE (1) TABLET BY MOUTH ONCE DAILY.  . hydrochlorothiazide (HYDRODIURIL) 25 MG tablet TAKE (1) TABLET BY MOUTH ONCE DAILY.  Marland Kitchen HYDROcodone-acetaminophen (NORCO/VICODIN) 5-325 MG tablet TAKE 1/2 TABLET TWICE DAILY AS NEEDED FOR MODERATE PAIN.  . meclizine (ANTIVERT) 25 MG tablet Take one tablet tid prn dizziness (Patient taking differently: Take 25 mg by mouth 3 (three) times daily as needed for dizziness. )  . meloxicam (MOBIC) 7.5 MG tablet TAKE (1) TABLET BY MOUTH ONCE DAILY.  Marland Kitchen oxybutynin (DITROPAN-XL) 10 MG 24 hr tablet TAKE (1) TABLET BY MOUTH AT BEDTIME  . [DISCONTINUED] buPROPion (WELLBUTRIN XL) 150 MG 24 hr tablet Take one tablet po daily  . [DISCONTINUED] diltiazem (CARDIZEM CD) 240 MG 24 hr capsule TAKE (1) CAPSULE BY MOUTH ONCE DAILY.  . [DISCONTINUED] donepezil (ARICEPT) 10 MG tablet TAKE (1) TABLET BY MOUTH EACH MORNING.  . [DISCONTINUED] doxazosin (CARDURA) 2 MG tablet TAKE (1) TABLET BY MOUTH ONCE DAILY.  . [DISCONTINUED] escitalopram (LEXAPRO) 20 MG tablet TAKE (1) TABLET BY MOUTH ONCE DAILY.  . [DISCONTINUED] hydrochlorothiazide (HYDRODIURIL) 25 MG tablet TAKE (  1) TABLET BY MOUTH ONCE DAILY.  . [DISCONTINUED] HYDROcodone-acetaminophen (NORCO/VICODIN) 5-325 MG tablet TAKE 1/2 TABLET TWICE DAILY AS NEEDED FOR MODERATE PAIN.  . [DISCONTINUED] oxybutynin (DITROPAN-XL) 10 MG 24 hr tablet TAKE (1) TABLET BY MOUTH AT BEDTIME.   No facility-administered encounter medications on file as of 04/30/2020.     Review of Systems  Constitutional: Negative for chills and fever.  HENT: Negative for congestion, rhinorrhea and sore throat.   Respiratory:  Negative for cough, shortness of breath and wheezing.   Cardiovascular: Negative for chest pain and leg swelling.  Gastrointestinal: Negative for abdominal pain, diarrhea, nausea and vomiting.  Genitourinary: Negative for dysuria and frequency.  Musculoskeletal: Negative for arthralgias and back pain.  Skin: Negative for rash.  Neurological: Negative for dizziness, weakness and headaches.  Psychiatric/Behavioral: Negative for dysphoric mood. The patient is not nervous/anxious.        +memory decline.     Vitals BP 130/88   Pulse 66   Temp 97.8 F (36.6 C) (Oral)   Wt 129 lb 6.4 oz (58.7 kg)   SpO2 95%   BMI 21.53 kg/m   Objective:   Physical Exam Vitals and nursing note reviewed.  Constitutional:      Appearance: Normal appearance.  HENT:     Head: Normocephalic and atraumatic.     Nose: Nose normal.     Mouth/Throat:     Mouth: Mucous membranes are moist.     Pharynx: Oropharynx is clear.  Eyes:     Extraocular Movements: Extraocular movements intact.     Conjunctiva/sclera: Conjunctivae normal.     Pupils: Pupils are equal, round, and reactive to light.  Cardiovascular:     Rate and Rhythm: Normal rate and regular rhythm.     Pulses: Normal pulses.     Heart sounds: Normal heart sounds.  Pulmonary:     Effort: Pulmonary effort is normal. No respiratory distress.     Breath sounds: Normal breath sounds. No wheezing, rhonchi or rales.  Musculoskeletal:        General: Normal range of motion.     Right lower leg: No edema.     Left lower leg: No edema.  Skin:    General: Skin is warm and dry.     Findings: No lesion or rash.  Neurological:     General: No focal deficit present.     Mental Status: She is alert.     Comments: Sitting in wheelchair, oriented to name.  Psychiatric:        Mood and Affect: Mood normal.        Behavior: Behavior normal.      Assessment and Plan   1. Essential hypertension, benign  2. Closed burst fracture of lumbar  vertebra, sequela - HYDROcodone-acetaminophen (NORCO/VICODIN) 5-325 MG tablet; TAKE 1/2 TABLET TWICE DAILY AS NEEDED FOR MODERATE PAIN.  Dispense: 30 tablet; Refill: 0  3. Chronic bilateral low back pain without sciatica - HYDROcodone-acetaminophen (NORCO/VICODIN) 5-325 MG tablet; TAKE 1/2 TABLET TWICE DAILY AS NEEDED FOR MODERATE PAIN.  Dispense: 30 tablet; Refill: 0  4. Alzheimer's disease of other onset without behavioral disturbance    Chronic low back pain- stable, improving with lower dosage of medication. Tylenol or mobic during day.  Severe pain or at night the norco. Will refill hydrocodone for 30 days.  Pt doing well with just prn 1/2 tablet for severe pain.  Dementia- stable, Nichole [name] working with social workers to get placement and needing FL2 forms filled out.  F/u  3 mo or prn.  Spent over in counseling time helping with coordination of care and discussion of placement of patient due to dementia.  Reviewing chart, notes, and labs.

## 2020-05-02 ENCOUNTER — Telehealth: Payer: Self-pay | Admitting: *Deleted

## 2020-05-02 NOTE — Telephone Encounter (Signed)
Kym Groom from Adult services called regarding paperwork needed for patient to be placed. It was supposed to be faxed over tuesday. She is requesting patient records, a letter from Dr. Brett Canales from 2017 stating patient was unable to take care of herself (wanting Dr. [name] to update this letter) and the [facility] formed filled out. Please let her know if we have received these or if they need to be resent. She is hoping to have this back by Clarita Mcelvain next week. Call back number [phone]

## 2020-05-03 ENCOUNTER — Telehealth: Payer: Self-pay | Admitting: Family Medicine

## 2020-05-03 NOTE — Telephone Encounter (Signed)
Social service sent over [facility] to be completed and also needing last three office visit. All paper work is attach please sign off and date so it can be faxed over. In your folder

## 2020-05-06 NOTE — Telephone Encounter (Signed)
done

## 2020-05-08 NOTE — Telephone Encounter (Signed)
Faxed over all information 05/07/2020

## 2020-05-09 ENCOUNTER — Telehealth: Payer: Self-pay | Admitting: Family Medicine

## 2020-05-09 NOTE — Telephone Encounter (Signed)
done

## 2020-05-09 NOTE — Telephone Encounter (Signed)
[facility] [employer] dropped off the [facility] Form for Nichole [name] only thing that was on the form when it was sent over was the Dr signature and the form has to be completed in order for the [facility] is accurate. SS worker wants the Dr to look over the form and make sure the information put on the form is correct. Once verified we will call her to come by and pick up the form.   [name] call back number is [phone] ext [phone]

## 2020-05-30 DIAGNOSIS — U071 COVID-19: Secondary | ICD-10-CM | POA: Diagnosis not present

## 2020-05-30 DIAGNOSIS — Z20828 Contact with and (suspected) exposure to other viral communicable diseases: Secondary | ICD-10-CM | POA: Diagnosis not present

## 2020-06-04 DIAGNOSIS — Z20828 Contact with and (suspected) exposure to other viral communicable diseases: Secondary | ICD-10-CM | POA: Diagnosis not present

## 2020-06-04 DIAGNOSIS — U071 COVID-19: Secondary | ICD-10-CM | POA: Diagnosis not present

## 2020-06-05 ENCOUNTER — Telehealth: Payer: Self-pay

## 2020-06-05 DIAGNOSIS — R82998 Other abnormal findings in urine: Secondary | ICD-10-CM | POA: Diagnosis not present

## 2020-06-05 DIAGNOSIS — R8290 Unspecified abnormal findings in urine: Secondary | ICD-10-CM

## 2020-06-05 NOTE — Telephone Encounter (Signed)
Yes pls order ua and culture. Dr. [name]

## 2020-06-05 NOTE — Telephone Encounter (Signed)
See below. I called silvia to get more info. She states her Urine has a strong odor and is dark and she is a  little more confused than normal. Started yesterday. Would like order for U/A and urine culture since not able to get her in. If you agree can put in order for [facility].

## 2020-06-05 NOTE — Telephone Encounter (Signed)
Patient is at [facility] and needing an order to get UTI specimen done. Due to that we dont have any appointments today. Please advise Nichole [name] [phone]

## 2020-06-05 NOTE — Telephone Encounter (Signed)
Called [facility] and let her know that order was put in for u/a and urine culture. She states she will drop off urine at [facility].

## 2020-06-06 DIAGNOSIS — Z23 Encounter for immunization: Secondary | ICD-10-CM | POA: Diagnosis not present

## 2020-06-06 DIAGNOSIS — Z20828 Contact with and (suspected) exposure to other viral communicable diseases: Secondary | ICD-10-CM | POA: Diagnosis not present

## 2020-06-06 DIAGNOSIS — U071 COVID-19: Secondary | ICD-10-CM | POA: Diagnosis not present

## 2020-06-06 LAB — URINALYSIS
Bilirubin, UA: NEGATIVE
Glucose, UA: NEGATIVE
Ketones, UA: NEGATIVE
Nitrite, UA: NEGATIVE
Protein,UA: NEGATIVE
RBC, UA: NEGATIVE
Specific Gravity, UA: 1.022 (ref 1.005–1.030)
Urobilinogen, Ur: 0.2 mg/dL (ref 0.2–1.0)
pH, UA: 5.5 (ref 5.0–7.5)

## 2020-06-07 ENCOUNTER — Encounter: Payer: Self-pay | Admitting: Family Medicine

## 2020-06-07 ENCOUNTER — Telehealth: Payer: Self-pay

## 2020-06-07 DIAGNOSIS — J449 Chronic obstructive pulmonary disease, unspecified: Secondary | ICD-10-CM | POA: Diagnosis not present

## 2020-06-07 DIAGNOSIS — G309 Alzheimer's disease, unspecified: Secondary | ICD-10-CM | POA: Diagnosis not present

## 2020-06-07 DIAGNOSIS — R278 Other lack of coordination: Secondary | ICD-10-CM | POA: Diagnosis not present

## 2020-06-07 DIAGNOSIS — F419 Anxiety disorder, unspecified: Secondary | ICD-10-CM | POA: Diagnosis not present

## 2020-06-07 NOTE — Telephone Encounter (Signed)
UA showing small leukocytes and cloudy.  No sign of UTI.  Advising increase in fluids.  I pt having more confusion, fever, or abdominal pain, then call or rto.   Thx.   Dr. [name]

## 2020-06-07 NOTE — Telephone Encounter (Signed)
Nichole [name] from [facility] wanting to see of they can get a call back on lab work completed on [place].  Call back [phone]

## 2020-06-07 NOTE — Telephone Encounter (Signed)
Tammy at [facility] contacted and verbalized understanding.

## 2020-06-07 NOTE — Telephone Encounter (Signed)
[facility] contacted. Spoke with Insurance account manager). Pt has become increasingly confused and is stubbling. Pt is eating/drinking well. Nursing center has increased her fluid take and has been giving cranberry juice. Tammy wanting to know if something can be sent in or what needs to be done. Please advise. Thank you  [facility]

## 2020-06-07 NOTE — Telephone Encounter (Signed)
[facility] calling in regards to urinalysis done on 06/05/2020. Please advise. Thank you

## 2020-06-07 NOTE — Telephone Encounter (Signed)
Unfortunately patient having increased confusion and stumbling.  It is too hard to know for certain what is causing this.  There is too much of a possibility that there could be something more serious potentially even significant infection beyond a urinary tract infection.  I think it is in the best interest of the patient for the patient to be evaluated in the ER  Patient did have leukocytes in the urine but no nitrites.  Elderly individuals often can have bacteria in the urine did not be the source of confusion.  Once again it is best to be on the same side with this

## 2020-06-10 DIAGNOSIS — G309 Alzheimer's disease, unspecified: Secondary | ICD-10-CM | POA: Diagnosis not present

## 2020-06-10 DIAGNOSIS — F419 Anxiety disorder, unspecified: Secondary | ICD-10-CM | POA: Diagnosis not present

## 2020-06-10 DIAGNOSIS — R278 Other lack of coordination: Secondary | ICD-10-CM | POA: Diagnosis not present

## 2020-06-10 DIAGNOSIS — J449 Chronic obstructive pulmonary disease, unspecified: Secondary | ICD-10-CM | POA: Diagnosis not present

## 2020-06-10 LAB — URINE CULTURE

## 2020-06-10 MED ORDER — CEPHALEXIN 500 MG PO CAPS: 500.0000 mg | ORAL_CAPSULE | Freq: Two times a day (BID) | ORAL | 0 refills | Status: DC

## 2020-06-10 NOTE — Addendum Note (Signed)
Addended by: Margaretha Sheffield on: 06/10/2020 04:49 PM   Modules accepted: Orders

## 2020-06-11 DIAGNOSIS — U071 COVID-19: Secondary | ICD-10-CM | POA: Diagnosis not present

## 2020-06-11 DIAGNOSIS — Z20828 Contact with and (suspected) exposure to other viral communicable diseases: Secondary | ICD-10-CM | POA: Diagnosis not present

## 2020-06-13 DIAGNOSIS — Z20828 Contact with and (suspected) exposure to other viral communicable diseases: Secondary | ICD-10-CM | POA: Diagnosis not present

## 2020-06-13 DIAGNOSIS — U071 COVID-19: Secondary | ICD-10-CM | POA: Diagnosis not present

## 2020-06-14 ENCOUNTER — Emergency Department (HOSPITAL_COMMUNITY): Payer: PPO

## 2020-06-14 ENCOUNTER — Other Ambulatory Visit: Payer: Self-pay

## 2020-06-14 ENCOUNTER — Emergency Department (HOSPITAL_COMMUNITY)
Admission: EM | Admit: 2020-06-14 | Discharge: 2020-06-14 | Disposition: A | Discharge status: Home / Self Care | Payer: PPO | Attending: Emergency Medicine | Admitting: Emergency Medicine

## 2020-06-14 ENCOUNTER — Encounter (HOSPITAL_COMMUNITY): Payer: Self-pay

## 2020-06-14 DIAGNOSIS — Y9269 Other specified industrial and construction area as the place of occurrence of the external cause: Secondary | ICD-10-CM | POA: Diagnosis not present

## 2020-06-14 DIAGNOSIS — Y9389 Activity, other specified: Secondary | ICD-10-CM | POA: Diagnosis not present

## 2020-06-14 DIAGNOSIS — Y92129 Unspecified place in nursing home as the place of occurrence of the external cause: Secondary | ICD-10-CM

## 2020-06-14 DIAGNOSIS — Z981 Arthrodesis status: Secondary | ICD-10-CM | POA: Diagnosis not present

## 2020-06-14 DIAGNOSIS — F0280 Dementia in other diseases classified elsewhere without behavioral disturbance: Secondary | ICD-10-CM | POA: Insufficient documentation

## 2020-06-14 DIAGNOSIS — Z79899 Other long term (current) drug therapy: Secondary | ICD-10-CM | POA: Diagnosis not present

## 2020-06-14 DIAGNOSIS — G319 Degenerative disease of nervous system, unspecified: Secondary | ICD-10-CM | POA: Diagnosis not present

## 2020-06-14 DIAGNOSIS — S199XXA Unspecified injury of neck, initial encounter: Secondary | ICD-10-CM | POA: Diagnosis not present

## 2020-06-14 DIAGNOSIS — M47812 Spondylosis without myelopathy or radiculopathy, cervical region: Secondary | ICD-10-CM | POA: Diagnosis not present

## 2020-06-14 DIAGNOSIS — W19XXXA Unspecified fall, initial encounter: Secondary | ICD-10-CM

## 2020-06-14 DIAGNOSIS — R410 Disorientation, unspecified: Secondary | ICD-10-CM | POA: Diagnosis not present

## 2020-06-14 DIAGNOSIS — I6782 Cerebral ischemia: Secondary | ICD-10-CM | POA: Diagnosis not present

## 2020-06-14 DIAGNOSIS — J449 Chronic obstructive pulmonary disease, unspecified: Secondary | ICD-10-CM | POA: Insufficient documentation

## 2020-06-14 DIAGNOSIS — S0990XA Unspecified injury of head, initial encounter: Secondary | ICD-10-CM | POA: Diagnosis not present

## 2020-06-14 DIAGNOSIS — G309 Alzheimer's disease, unspecified: Secondary | ICD-10-CM | POA: Diagnosis not present

## 2020-06-14 DIAGNOSIS — F419 Anxiety disorder, unspecified: Secondary | ICD-10-CM | POA: Diagnosis not present

## 2020-06-14 DIAGNOSIS — R001 Bradycardia, unspecified: Secondary | ICD-10-CM | POA: Diagnosis not present

## 2020-06-14 DIAGNOSIS — R0902 Hypoxemia: Secondary | ICD-10-CM | POA: Diagnosis not present

## 2020-06-14 DIAGNOSIS — I10 Essential (primary) hypertension: Secondary | ICD-10-CM | POA: Diagnosis not present

## 2020-06-14 DIAGNOSIS — R278 Other lack of coordination: Secondary | ICD-10-CM | POA: Diagnosis not present

## 2020-06-14 DIAGNOSIS — W010XXA Fall on same level from slipping, tripping and stumbling without subsequent striking against object, initial encounter: Secondary | ICD-10-CM | POA: Diagnosis not present

## 2020-06-14 NOTE — Telephone Encounter (Signed)
Did you get results of u/a and urine culture

## 2020-06-14 NOTE — ED Triage Notes (Signed)
Pt brought in from [facility]. Pt had a witnessed fall. Pt found sitting on buttocks against wall. Pt complaining of pain back of head. Pt has hematoma to occipital area  No loss of consciousness. Pt is alert and oriented x2 with hx of dementia. Pt being tx for UTI

## 2020-06-14 NOTE — Discharge Instructions (Addendum)
The CT of your head andThe CT of your head and cervical spine did not show any significant injuires.  You may take tylenol 500 mg every 4 hours if needed for pain.  Follow-up with your doctor for recheck

## 2020-06-14 NOTE — Telephone Encounter (Signed)
Yes, and I sent in keflex for pt, but appears in chart she is at ER for a fall today.    Thx.  Dr. [name]
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
## 2020-06-17 DIAGNOSIS — F419 Anxiety disorder, unspecified: Secondary | ICD-10-CM | POA: Diagnosis not present

## 2020-06-17 DIAGNOSIS — R278 Other lack of coordination: Secondary | ICD-10-CM | POA: Diagnosis not present

## 2020-06-17 DIAGNOSIS — G309 Alzheimer's disease, unspecified: Secondary | ICD-10-CM | POA: Diagnosis not present

## 2020-06-17 DIAGNOSIS — J449 Chronic obstructive pulmonary disease, unspecified: Secondary | ICD-10-CM | POA: Diagnosis not present

## 2020-06-18 DIAGNOSIS — Z20828 Contact with and (suspected) exposure to other viral communicable diseases: Secondary | ICD-10-CM | POA: Diagnosis not present

## 2020-06-18 DIAGNOSIS — U071 COVID-19: Secondary | ICD-10-CM | POA: Diagnosis not present

## 2020-06-19 ENCOUNTER — Other Ambulatory Visit: Payer: Self-pay

## 2020-06-19 ENCOUNTER — Encounter: Payer: Self-pay | Admitting: Family Medicine

## 2020-06-19 ENCOUNTER — Ambulatory Visit (INDEPENDENT_AMBULATORY_CARE_PROVIDER_SITE_OTHER): Payer: PPO | Admitting: Family Medicine

## 2020-06-19 VITALS — BP 142/82 | HR 62 | Temp 97.6°F

## 2020-06-19 DIAGNOSIS — F419 Anxiety disorder, unspecified: Secondary | ICD-10-CM | POA: Diagnosis not present

## 2020-06-19 DIAGNOSIS — J449 Chronic obstructive pulmonary disease, unspecified: Secondary | ICD-10-CM | POA: Diagnosis not present

## 2020-06-19 DIAGNOSIS — M25561 Pain in right knee: Secondary | ICD-10-CM

## 2020-06-19 DIAGNOSIS — M545 Low back pain, unspecified: Secondary | ICD-10-CM

## 2020-06-19 DIAGNOSIS — G309 Alzheimer's disease, unspecified: Secondary | ICD-10-CM | POA: Diagnosis not present

## 2020-06-19 DIAGNOSIS — W19XXXD Unspecified fall, subsequent encounter: Secondary | ICD-10-CM

## 2020-06-19 DIAGNOSIS — R278 Other lack of coordination: Secondary | ICD-10-CM | POA: Diagnosis not present

## 2020-06-19 NOTE — Progress Notes (Signed)
Patient ID: Nichole [name], female    DOB: July 29, 1946, 74 y.o.   MRN: [id number]   Chief Complaint  Patient presents with  . Follow-up    Patient following up after fall on her back in nursing facility. No reported LOC, vomiting or abrupt mental changes per facility staff. Patient reports slight pain to the back of her head today but nothing concerning.    Subjective:    HPI  Pt seen for f/u after having a fall at the nursing home facility.  They reported found pt sitting against wall on her bottom.  No obvious head injury.  No LOC.   No abnormal changes from her baseline mental status.  Pain in lower back and now reporting pain in back of her head.   Having any pain- pain in the back.  Pt seen today with nurse from the health care facility.   Medical History [name] has a past medical history of Anxiety, Asthma, Colon polyp, COPD (chronic obstructive pulmonary disease) (HCC), Dementia (HCC), Depression, Diverticular disease, Hyperlipidemia, Hypertension, IFG (impaired fasting glucose), Memory loss, and Mild sleep apnea.   Outpatient Encounter Medications as of 06/19/2020  Medication Sig  . albuterol (PROAIR HFA) 108 (90 Base) MCG/ACT inhaler Inhale 2 puffs into the lungs every 6 (six) hours as needed. for wheezing (Patient not taking: Reported on 02/02/2020)  . buPROPion (WELLBUTRIN XL) 150 MG 24 hr tablet Take one tablet po daily  . diltiazem (CARDIZEM CD) 240 MG 24 hr capsule TAKE (1) CAPSULE BY MOUTH ONCE DAILY.  Marland Kitchen donepezil (ARICEPT) 10 MG tablet TAKE (1) TABLET BY MOUTH EACH MORNING.  Marland Kitchen doxazosin (CARDURA) 2 MG tablet TAKE (1) TABLET BY MOUTH ONCE DAILY.  Marland Kitchen escitalopram (LEXAPRO) 20 MG tablet TAKE (1) TABLET BY MOUTH ONCE DAILY.  . hydrochlorothiazide (HYDRODIURIL) 25 MG tablet TAKE (1) TABLET BY MOUTH ONCE DAILY.  . meclizine (ANTIVERT) 25 MG tablet Take one tablet tid prn dizziness (Patient taking differently: Take 25 mg by mouth 3 (three) times daily as needed for dizziness. )    . meloxicam (MOBIC) 7.5 MG tablet TAKE (1) TABLET BY MOUTH ONCE DAILY.  Marland Kitchen oxybutynin (DITROPAN-XL) 10 MG 24 hr tablet TAKE (1) TABLET BY MOUTH AT BEDTIME  . [DISCONTINUED] cephALEXin (KEFLEX) 500 MG capsule Take 1 capsule (500 mg total) by mouth 2 (two) times daily.  . [DISCONTINUED] HYDROcodone-acetaminophen (NORCO/VICODIN) 5-325 MG tablet TAKE 1/2 TABLET TWICE DAILY AS NEEDED FOR MODERATE PAIN.   No facility-administered encounter medications on file as of 06/19/2020.     Review of Systems  Constitutional: Negative for chills and fever.  HENT: Negative for congestion, rhinorrhea and sore throat.   Respiratory: Negative for cough, shortness of breath and wheezing.   Cardiovascular: Negative for chest pain and leg swelling.  Gastrointestinal: Negative for abdominal pain, diarrhea, nausea and vomiting.  Genitourinary: Negative for dysuria and frequency.  Musculoskeletal: Positive for arthralgias (rt knee pain) and back pain.  Skin: Negative for rash.  Neurological: Negative for dizziness, weakness and headaches.     Vitals BP (!) 142/82   Pulse 62   Temp 97.6 F (36.4 C)   SpO2 96%   Objective:   Physical Exam Vitals and nursing note reviewed.  Constitutional:      General: She is not in acute distress.    Appearance: Normal appearance.  HENT:     Head: Normocephalic and atraumatic.     Nose: Nose normal.     Mouth/Throat:     Mouth: Mucous membranes  are moist.     Pharynx: Oropharynx is clear.  Eyes:     Extraocular Movements: Extraocular movements intact.     Conjunctiva/sclera: Conjunctivae normal.     Pupils: Pupils are equal, round, and reactive to light.  Cardiovascular:     Rate and Rhythm: Normal rate and regular rhythm.     Pulses: Normal pulses.     Heart sounds: Normal heart sounds.  Pulmonary:     Effort: Pulmonary effort is normal. No respiratory distress.     Breath sounds: Normal breath sounds. No wheezing, rhonchi or rales.  Musculoskeletal:         General: Tenderness (rt knee) present. Normal range of motion.     Right lower leg: No edema.     Left lower leg: No edema.  Skin:    General: Skin is warm and dry.     Findings: No lesion or rash.     Comments: No ecchymosis on back or bottom.  Rt knee- no ecchymosis, swelling, erythema, or warmth. Normal rom, normal rom of foot/ankle on rt.  Neurological:     General: No focal deficit present.     Mental Status: She is alert.     Cranial Nerves: No cranial nerve deficit.     Comments: +oriented to person, h/o dementia  Psychiatric:        Mood and Affect: Mood normal.        Behavior: Behavior normal.      Assessment and Plan   1. Fall, subsequent encounter  2. Lumbar back pain - DG Lumbar Spine Complete; Future  3. Acute pain of right knee - DG Knee Complete 4 Views Right; Future   Lumbar back pain- ordered xray.  Cont with tylenol for mild pain prn. And norco 1/2 tab bid for 7 days then prn. Since pt has dementia, pt unable to ask for pain medication. Will schedule the medication for 1 wk.  Then go back to prn tylenol medication.  Rt knee pain- ordered xray.  Will call with results.  F/u prn.

## 2020-06-20 DIAGNOSIS — U071 COVID-19: Secondary | ICD-10-CM | POA: Diagnosis not present

## 2020-06-20 DIAGNOSIS — Z20828 Contact with and (suspected) exposure to other viral communicable diseases: Secondary | ICD-10-CM | POA: Diagnosis not present

## 2020-06-21 ENCOUNTER — Other Ambulatory Visit: Payer: Self-pay | Admitting: Family Medicine

## 2020-06-21 DIAGNOSIS — M545 Low back pain, unspecified: Secondary | ICD-10-CM

## 2020-06-21 DIAGNOSIS — G8929 Other chronic pain: Secondary | ICD-10-CM

## 2020-06-21 DIAGNOSIS — S32001S Stable burst fracture of unspecified lumbar vertebra, sequela: Secondary | ICD-10-CM

## 2020-06-24 DIAGNOSIS — G309 Alzheimer's disease, unspecified: Secondary | ICD-10-CM | POA: Diagnosis not present

## 2020-06-24 DIAGNOSIS — F419 Anxiety disorder, unspecified: Secondary | ICD-10-CM | POA: Diagnosis not present

## 2020-06-24 DIAGNOSIS — R278 Other lack of coordination: Secondary | ICD-10-CM | POA: Diagnosis not present

## 2020-06-24 DIAGNOSIS — J449 Chronic obstructive pulmonary disease, unspecified: Secondary | ICD-10-CM | POA: Diagnosis not present

## 2020-06-25 DIAGNOSIS — U071 COVID-19: Secondary | ICD-10-CM | POA: Diagnosis not present

## 2020-06-25 DIAGNOSIS — Z20828 Contact with and (suspected) exposure to other viral communicable diseases: Secondary | ICD-10-CM | POA: Diagnosis not present

## 2020-06-26 DIAGNOSIS — J449 Chronic obstructive pulmonary disease, unspecified: Secondary | ICD-10-CM | POA: Diagnosis not present

## 2020-06-26 DIAGNOSIS — G309 Alzheimer's disease, unspecified: Secondary | ICD-10-CM | POA: Diagnosis not present

## 2020-06-26 DIAGNOSIS — R278 Other lack of coordination: Secondary | ICD-10-CM | POA: Diagnosis not present

## 2020-06-26 DIAGNOSIS — F419 Anxiety disorder, unspecified: Secondary | ICD-10-CM | POA: Diagnosis not present

## 2020-06-27 DIAGNOSIS — Z20828 Contact with and (suspected) exposure to other viral communicable diseases: Secondary | ICD-10-CM | POA: Diagnosis not present

## 2020-06-27 DIAGNOSIS — U071 COVID-19: Secondary | ICD-10-CM | POA: Diagnosis not present

## 2020-07-01 ENCOUNTER — Telehealth: Payer: Self-pay | Admitting: *Deleted

## 2020-07-01 NOTE — Telephone Encounter (Signed)
[facility] states they were not aware of the x rays but will try to take her in the morning.

## 2020-07-01 NOTE — Telephone Encounter (Signed)
Nichole [name], [name], DO -Pls call nursing facility, pt didn't get the xrays yet from her fall on her lower back and rt knee. See if they can still take her for these

## 2020-07-02 ENCOUNTER — Ambulatory Visit (HOSPITAL_COMMUNITY)
Admission: RE | Admit: 2020-07-02 | Discharge: 2020-07-02 | Disposition: A | Discharge status: Home / Self Care | Payer: PPO | Source: Ambulatory Visit | Attending: Family Medicine | Admitting: Family Medicine

## 2020-07-02 ENCOUNTER — Other Ambulatory Visit: Payer: Self-pay

## 2020-07-02 DIAGNOSIS — M25561 Pain in right knee: Secondary | ICD-10-CM | POA: Diagnosis not present

## 2020-07-02 DIAGNOSIS — M1711 Unilateral primary osteoarthritis, right knee: Secondary | ICD-10-CM | POA: Diagnosis not present

## 2020-07-02 DIAGNOSIS — Z20828 Contact with and (suspected) exposure to other viral communicable diseases: Secondary | ICD-10-CM | POA: Diagnosis not present

## 2020-07-02 DIAGNOSIS — M545 Low back pain, unspecified: Secondary | ICD-10-CM | POA: Insufficient documentation

## 2020-07-02 DIAGNOSIS — U071 COVID-19: Secondary | ICD-10-CM | POA: Diagnosis not present

## 2020-07-02 DIAGNOSIS — M11261 Other chondrocalcinosis, right knee: Secondary | ICD-10-CM | POA: Diagnosis not present

## 2020-07-03 ENCOUNTER — Telehealth: Payer: Self-pay | Admitting: *Deleted

## 2020-07-03 DIAGNOSIS — F419 Anxiety disorder, unspecified: Secondary | ICD-10-CM | POA: Diagnosis not present

## 2020-07-03 DIAGNOSIS — R278 Other lack of coordination: Secondary | ICD-10-CM | POA: Diagnosis not present

## 2020-07-03 DIAGNOSIS — J449 Chronic obstructive pulmonary disease, unspecified: Secondary | ICD-10-CM | POA: Diagnosis not present

## 2020-07-03 DIAGNOSIS — G309 Alzheimer's disease, unspecified: Secondary | ICD-10-CM | POA: Diagnosis not present

## 2020-07-03 NOTE — Telephone Encounter (Signed)
Called and gave verbal order to silvia. They need order faxed to [facility]. Fax number [phone]. Order written and will fax after dr taylor signs. Silvia notified.

## 2020-07-03 NOTE — Telephone Encounter (Signed)
Yes pls send PT.    Thx,   Dr. [name]

## 2020-07-03 NOTE — Telephone Encounter (Signed)
silvia from [facility] calling to see if they can get pt order for physical therapy. States she had a fall and is unsteady. They use [facility] for physical therapy.  silvia at [facility] - [phone]

## 2020-07-04 ENCOUNTER — Other Ambulatory Visit: Payer: Self-pay | Admitting: *Deleted

## 2020-07-04 DIAGNOSIS — Z20828 Contact with and (suspected) exposure to other viral communicable diseases: Secondary | ICD-10-CM | POA: Diagnosis not present

## 2020-07-04 DIAGNOSIS — U071 COVID-19: Secondary | ICD-10-CM | POA: Diagnosis not present

## 2020-07-04 DIAGNOSIS — S22000A Wedge compression fracture of unspecified thoracic vertebra, initial encounter for closed fracture: Secondary | ICD-10-CM

## 2020-07-09 DIAGNOSIS — Z20828 Contact with and (suspected) exposure to other viral communicable diseases: Secondary | ICD-10-CM | POA: Diagnosis not present

## 2020-07-09 DIAGNOSIS — U071 COVID-19: Secondary | ICD-10-CM | POA: Diagnosis not present

## 2020-07-10 DIAGNOSIS — F419 Anxiety disorder, unspecified: Secondary | ICD-10-CM | POA: Diagnosis not present

## 2020-07-10 DIAGNOSIS — G8911 Acute pain due to trauma: Secondary | ICD-10-CM | POA: Diagnosis not present

## 2020-07-10 DIAGNOSIS — I10 Essential (primary) hypertension: Secondary | ICD-10-CM | POA: Diagnosis not present

## 2020-07-10 DIAGNOSIS — F0280 Dementia in other diseases classified elsewhere without behavioral disturbance: Secondary | ICD-10-CM | POA: Diagnosis not present

## 2020-07-10 DIAGNOSIS — M25561 Pain in right knee: Secondary | ICD-10-CM | POA: Diagnosis not present

## 2020-07-10 DIAGNOSIS — G300 Alzheimer's disease with early onset: Secondary | ICD-10-CM | POA: Diagnosis not present

## 2020-07-10 DIAGNOSIS — R296 Repeated falls: Secondary | ICD-10-CM | POA: Diagnosis not present

## 2020-07-10 DIAGNOSIS — J449 Chronic obstructive pulmonary disease, unspecified: Secondary | ICD-10-CM | POA: Diagnosis not present

## 2020-07-10 DIAGNOSIS — M545 Low back pain, unspecified: Secondary | ICD-10-CM | POA: Diagnosis not present

## 2020-07-10 DIAGNOSIS — R2681 Unsteadiness on feet: Secondary | ICD-10-CM | POA: Diagnosis not present

## 2020-07-16 DIAGNOSIS — Z20828 Contact with and (suspected) exposure to other viral communicable diseases: Secondary | ICD-10-CM | POA: Diagnosis not present

## 2020-07-16 DIAGNOSIS — U071 COVID-19: Secondary | ICD-10-CM | POA: Diagnosis not present

## 2020-07-18 ENCOUNTER — Other Ambulatory Visit: Payer: Self-pay

## 2020-07-18 ENCOUNTER — Ambulatory Visit (INDEPENDENT_AMBULATORY_CARE_PROVIDER_SITE_OTHER): Payer: Medicare Other

## 2020-07-18 ENCOUNTER — Ambulatory Visit (INDEPENDENT_AMBULATORY_CARE_PROVIDER_SITE_OTHER): Payer: Medicare Other | Admitting: Orthopaedic Surgery

## 2020-07-18 ENCOUNTER — Encounter: Payer: Self-pay | Admitting: Orthopaedic Surgery

## 2020-07-18 DIAGNOSIS — S22080A Wedge compression fracture of T11-T12 vertebra, initial encounter for closed fracture: Secondary | ICD-10-CM

## 2020-07-18 DIAGNOSIS — M549 Dorsalgia, unspecified: Secondary | ICD-10-CM | POA: Diagnosis not present

## 2020-07-18 DIAGNOSIS — U071 COVID-19: Secondary | ICD-10-CM | POA: Diagnosis not present

## 2020-07-18 DIAGNOSIS — Z20828 Contact with and (suspected) exposure to other viral communicable diseases: Secondary | ICD-10-CM | POA: Diagnosis not present

## 2020-07-18 DIAGNOSIS — S22089A Unspecified fracture of T11-T12 vertebra, initial encounter for closed fracture: Secondary | ICD-10-CM | POA: Insufficient documentation
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
## 2020-07-23 DIAGNOSIS — U071 COVID-19: Secondary | ICD-10-CM | POA: Diagnosis not present

## 2020-07-23 DIAGNOSIS — Z20828 Contact with and (suspected) exposure to other viral communicable diseases: Secondary | ICD-10-CM | POA: Diagnosis not present

## 2020-07-25 DIAGNOSIS — Z20828 Contact with and (suspected) exposure to other viral communicable diseases: Secondary | ICD-10-CM | POA: Diagnosis not present

## 2020-07-25 DIAGNOSIS — U071 COVID-19: Secondary | ICD-10-CM | POA: Diagnosis not present

## 2020-07-30 ENCOUNTER — Ambulatory Visit (INDEPENDENT_AMBULATORY_CARE_PROVIDER_SITE_OTHER): Payer: Medicare Other | Admitting: Family Medicine

## 2020-07-30 ENCOUNTER — Telehealth: Payer: Self-pay | Admitting: Family Medicine

## 2020-07-30 ENCOUNTER — Encounter: Payer: Self-pay | Admitting: Family Medicine

## 2020-07-30 VITALS — BP 138/82 | HR 53 | Temp 97.0°F | Ht 65.0 in | Wt 137.6 lb

## 2020-07-30 DIAGNOSIS — G8929 Other chronic pain: Secondary | ICD-10-CM

## 2020-07-30 DIAGNOSIS — Z9889 Other specified postprocedural states: Secondary | ICD-10-CM | POA: Diagnosis not present

## 2020-07-30 DIAGNOSIS — F0391 Unspecified dementia with behavioral disturbance: Secondary | ICD-10-CM | POA: Diagnosis not present

## 2020-07-30 DIAGNOSIS — M545 Low back pain, unspecified: Secondary | ICD-10-CM

## 2020-07-30 DIAGNOSIS — Z20828 Contact with and (suspected) exposure to other viral communicable diseases: Secondary | ICD-10-CM | POA: Diagnosis not present

## 2020-07-30 DIAGNOSIS — F03B18 Unspecified dementia, moderate, with other behavioral disturbance: Secondary | ICD-10-CM

## 2020-07-30 DIAGNOSIS — S22000A Wedge compression fracture of unspecified thoracic vertebra, initial encounter for closed fracture: Secondary | ICD-10-CM

## 2020-07-30 DIAGNOSIS — Z79891 Long term (current) use of opiate analgesic: Secondary | ICD-10-CM | POA: Diagnosis not present

## 2020-07-30 DIAGNOSIS — R001 Bradycardia, unspecified: Secondary | ICD-10-CM | POA: Diagnosis not present

## 2020-07-30 DIAGNOSIS — S32001S Stable burst fracture of unspecified lumbar vertebra, sequela: Secondary | ICD-10-CM

## 2020-07-30 DIAGNOSIS — U071 COVID-19: Secondary | ICD-10-CM | POA: Diagnosis not present

## 2020-07-30 MED ORDER — ACETAMINOPHEN 500 MG PO TABS: 500.0000 mg | ORAL_TABLET | Freq: Two times a day (BID) | ORAL | 0 refills | Status: DC

## 2020-07-30 NOTE — Telephone Encounter (Signed)
Pharmacist from [facility] calling and states that he did receive the 7 day course of Tylenol but has not received the 7 day course of Hydrocodone. Please advise. Thank you

## 2020-07-30 NOTE — Progress Notes (Signed)
Patient ID: Nichole [name], female    DOB: 1945-08-23, 74 y.o.   MRN: [id number]   Chief Complaint  Patient presents with  . Pain Management  . Vertebral Fracture    T11   Subjective:    HPI  This patient was seen today for chronic pain, low back pain, with compression fx and h/o lumbar surgery.  Pt seen today with caregiver from [facility] nursing [facility].  The medication list was reviewed and updated.   -Compliance with medication: Hydrocodone 5-325 mg   - Number patient states they take daily: 0-2  -when was the last dose patient took? Sunday    Here for refills and follow up  Pt given medications by the nursing staff. Was on prn norco, however, with h/o dementia pt unable to remember ask for pain medications.  Side effects or complications from medications: constipation   Patient is aware that pain medications are meant to minimize the severity of the pain to allow their pain levels to improve to allow for better function. They are aware of that pain medications cannot totally remove their pain.  Due for UDT ( at least once per year) : due today but unable to give sample, pt given meds by nursing facility.  Scale of 1 to 10 ( 1 is least 10 is most) Your pain level without the medicine: 6 Your pain level with medication 0  Scale 1 to 10 ( 1-helps very little, 10 helps very well) How well does your pain medication reduce your pain so you can function better through out the day? 10  Per caregiver- Sleeping more, getting up eat and bathroom. But not as mobile.  Not having any more falls, since last visit.  Pt seen on 06/19/20 for f/u after a fall at the facility. Has seen Dr. Ophelia Charter from ortho about the thoracic wedge fx.  they are repeating xray in 6 weeks.  Pt stating not getting "pain meds." per pt. Caregiver his part time and not sure how meds are dosed.  The list of medication dosing, is not up to date for 07/17/20-current day. Not sure if pt is getting meds  daily. Per the notes that were brought by the caregiver. The dates are not filled in for her medications, unable to tell if pt is getting pain meds or any meds daily.   Medical History Nichole [name] has a past medical history of Anxiety, Asthma, Colon polyp, COPD (chronic obstructive pulmonary disease) (HCC), Dementia (HCC), Depression, Diverticular disease, Hyperlipidemia, Hypertension, IFG (impaired fasting glucose), Memory loss, and Mild sleep apnea.   Outpatient Encounter Medications as of 07/30/2020  Medication Sig  . albuterol (PROAIR HFA) 108 (90 Base) MCG/ACT inhaler Inhale 2 puffs into the lungs every 6 (six) hours as needed. for wheezing  . buPROPion (WELLBUTRIN XL) 150 MG 24 hr tablet Take one tablet po daily  . diltiazem (CARDIZEM CD) 240 MG 24 hr capsule TAKE (1) CAPSULE BY MOUTH ONCE DAILY.  Marland Kitchen donepezil (ARICEPT) 10 MG tablet TAKE (1) TABLET BY MOUTH EACH MORNING.  Marland Kitchen doxazosin (CARDURA) 2 MG tablet TAKE (1) TABLET BY MOUTH ONCE DAILY.  Marland Kitchen escitalopram (LEXAPRO) 20 MG tablet TAKE (1) TABLET BY MOUTH ONCE DAILY.  . hydrochlorothiazide (HYDRODIURIL) 25 MG tablet TAKE (1) TABLET BY MOUTH ONCE DAILY.  . meclizine (ANTIVERT) 25 MG tablet Take one tablet tid prn dizziness (Patient taking differently: Take 25 mg by mouth 3 (three) times daily as needed for dizziness.)  . meloxicam (MOBIC) 7.5 MG tablet TAKE (  1) TABLET BY MOUTH ONCE DAILY.  Marland Kitchen oxybutynin (DITROPAN-XL) 10 MG 24 hr tablet TAKE (1) TABLET BY MOUTH AT BEDTIME  . [DISCONTINUED] HYDROcodone-acetaminophen (NORCO/VICODIN) 5-325 MG tablet TAKE 1/2 TABLET TWICE DAILY for 7 days, then take 1/2 tab p.o. bid prn moderate pain.  Marland Kitchen acetaminophen (TYLENOL) 500 MG tablet Take 1 tablet (500 mg total) by mouth 2 (two) times daily. For thoracic fracture.   No facility-administered encounter medications on file as of 07/30/2020.     Review of Systems  Constitutional: Negative for chills and fever.  HENT: Negative for congestion, rhinorrhea and  sore throat.   Respiratory: Negative for cough, shortness of breath and wheezing.   Cardiovascular: Negative for chest pain and leg swelling.  Gastrointestinal: Negative for abdominal pain, diarrhea, nausea and vomiting.  Genitourinary: Negative for dysuria and frequency.  Musculoskeletal: Positive for back pain (chronic). Negative for arthralgias.  Skin: Negative for rash.  Neurological: Negative for dizziness, weakness and headaches.       +h/o dementia  Psychiatric/Behavioral: Negative for dysphoric mood. The patient is not nervous/anxious.      Vitals BP 138/82   Pulse (!) 53   Temp (!) 97 F (36.1 C)   Ht 5\' 5"  (1.651 m)   Wt 137 lb 9.6 oz (62.4 kg)   SpO2 97%   BMI 22.90 kg/m   Objective:   Physical Exam Vitals and nursing note reviewed.  Constitutional:      General: She is not in acute distress.    Appearance: Normal appearance. She is normal weight. She is not ill-appearing.  HENT:     Head: Normocephalic.     Nose: Nose normal.     Mouth/Throat:     Mouth: Mucous membranes are moist.  Eyes:     Extraocular Movements: Extraocular movements intact.     Conjunctiva/sclera: Conjunctivae normal.     Pupils: Pupils are equal, round, and reactive to light.  Cardiovascular:     Rate and Rhythm: Regular rhythm. Bradycardia present.     Pulses: Normal pulses.  Pulmonary:     Effort: Pulmonary effort is normal. No respiratory distress.     Breath sounds: Normal breath sounds.  Musculoskeletal:        General: Tenderness (over sacral area, no ttp over lumbar spinous process) present. No swelling, deformity or signs of injury. Normal range of motion.     Cervical back: Normal range of motion.     Right lower leg: No edema.     Left lower leg: No edema.     Comments: Walking with rolling Smeltzer  Skin:    General: Skin is warm and dry.     Findings: No rash.  Neurological:     General: No focal deficit present.     Mental Status: She is alert. Mental status is at  baseline.     Cranial Nerves: No cranial nerve deficit.     Sensory: No sensory deficit.     Motor: No weakness.     Gait: Gait normal.     Deep Tendon Reflexes: Reflexes normal.     Comments: Oriented to person only, h/o dementia.  Psychiatric:        Mood and Affect: Mood normal.        Behavior: Behavior normal.      Assessment and Plan   1. Compression fracture of body of thoracic vertebra (HCC) - acetaminophen (TYLENOL) 500 MG tablet; Take 1 tablet (500 mg total) by mouth 2 (two) times daily. For  thoracic fracture.  Dispense: 14 tablet; Refill: 0  2. Encounter for long-term opiate analgesic use  3. Bradycardia  4. Moderate dementia with behavioral disturbance (HCC)  5. History of lumbar surgery   Chronic lumbar pain, h/o lumbar surgery.  And new thoracic compression fx since 2020.- Has been on pain meds norco 5mg  bid or tid since at least 2012. h/o lumbar surgery in past. Now has a thoracic T11- fracture in past year seen on xray. Pt was referred to ortho.   Today-ordered norco bid for 7 days. Pt unable to ask for medications due to h/o dementia. Tylenol bid for 7 days also.  Will need to schedule tylenol if needing it, due to pt unable to ask for medications due to dementia.  Incidental finding of bradycardia today.  Will cont to monitor. Reviewed meds. HR ranges normally in 60-70s.  Has had a few in 50s over last few yrs.  Denies chest pain, palpations, or syncope.  H/o dementia- stable.  Not worsening at this point.  Living in [facility] memory unit.  F/u 66mo or prn.

## 2020-07-30 NOTE — Telephone Encounter (Signed)
There should be 41 pills from last script at the [facility].  Not sure she has already gone through all that.  Do they need a new script for the 7 days of norco then?  Dr. [name]

## 2020-07-31 ENCOUNTER — Telehealth: Payer: Self-pay

## 2020-07-31 MED ORDER — HYDROCODONE-ACETAMINOPHEN 5-325 MG PO TABS: 0.5000 | ORAL_TABLET | Freq: Two times a day (BID) | ORAL | 0 refills | Status: DC

## 2020-07-31 NOTE — Telephone Encounter (Signed)
Dr [name] recommends follow up in 3 months- sooner if needed. Husband transferred up front to scheduled 3 month follow up

## 2020-07-31 NOTE — Telephone Encounter (Signed)
Ok, sent new script

## 2020-07-31 NOTE — Telephone Encounter (Signed)
[facility] stated the patient has 92 pills in closet locked up and 12 pills in the cart  The PRN order must be D/C on the new script sent to pharmacy for them to follow the new script  [facility] script to [facility]

## 2020-07-31 NOTE — Addendum Note (Signed)
Addended by: [name] on: 07/31/2020 04:10 PM   Modules accepted: Orders

## 2020-07-31 NOTE — Telephone Encounter (Signed)
Called rx - no answer and no voicemail came up.

## 2020-07-31 NOTE — Telephone Encounter (Signed)
Husband wants to discuss follow up appointment for 3 months because it wasn't on her check out summary.

## 2020-08-01 DIAGNOSIS — Z20828 Contact with and (suspected) exposure to other viral communicable diseases: Secondary | ICD-10-CM | POA: Diagnosis not present

## 2020-08-01 DIAGNOSIS — U071 COVID-19: Secondary | ICD-10-CM | POA: Diagnosis not present

## 2020-08-04 ENCOUNTER — Encounter: Payer: Self-pay | Admitting: Family Medicine

## 2020-08-04 DIAGNOSIS — Z9889 Other specified postprocedural states: Secondary | ICD-10-CM | POA: Insufficient documentation

## 2020-08-04 DIAGNOSIS — R001 Bradycardia, unspecified: Secondary | ICD-10-CM | POA: Insufficient documentation

## 2020-08-05 ENCOUNTER — Emergency Department (HOSPITAL_COMMUNITY): Payer: Medicare Other

## 2020-08-05 ENCOUNTER — Emergency Department (HOSPITAL_COMMUNITY)
Admission: EM | Admit: 2020-08-05 | Discharge: 2020-08-05 | Disposition: A | Discharge status: Home / Self Care | Payer: Medicare Other | Attending: Emergency Medicine | Admitting: Emergency Medicine

## 2020-08-05 ENCOUNTER — Other Ambulatory Visit: Payer: Self-pay

## 2020-08-05 ENCOUNTER — Encounter (HOSPITAL_COMMUNITY): Payer: Self-pay | Admitting: Emergency Medicine

## 2020-08-05 ENCOUNTER — Telehealth: Payer: Self-pay | Admitting: Family Medicine

## 2020-08-05 DIAGNOSIS — J449 Chronic obstructive pulmonary disease, unspecified: Secondary | ICD-10-CM | POA: Insufficient documentation

## 2020-08-05 DIAGNOSIS — Y92129 Unspecified place in nursing home as the place of occurrence of the external cause: Secondary | ICD-10-CM | POA: Diagnosis not present

## 2020-08-05 DIAGNOSIS — Z87891 Personal history of nicotine dependence: Secondary | ICD-10-CM | POA: Insufficient documentation

## 2020-08-05 DIAGNOSIS — W1839XA Other fall on same level, initial encounter: Secondary | ICD-10-CM | POA: Diagnosis not present

## 2020-08-05 DIAGNOSIS — W19XXXA Unspecified fall, initial encounter: Secondary | ICD-10-CM | POA: Diagnosis not present

## 2020-08-05 DIAGNOSIS — I10 Essential (primary) hypertension: Secondary | ICD-10-CM | POA: Insufficient documentation

## 2020-08-05 DIAGNOSIS — M25511 Pain in right shoulder: Secondary | ICD-10-CM | POA: Diagnosis present

## 2020-08-05 DIAGNOSIS — J45909 Unspecified asthma, uncomplicated: Secondary | ICD-10-CM | POA: Diagnosis not present

## 2020-08-05 DIAGNOSIS — F0390 Unspecified dementia without behavioral disturbance: Secondary | ICD-10-CM | POA: Insufficient documentation

## 2020-08-05 DIAGNOSIS — R030 Elevated blood-pressure reading, without diagnosis of hypertension: Secondary | ICD-10-CM | POA: Diagnosis not present

## 2020-08-05 DIAGNOSIS — R52 Pain, unspecified: Secondary | ICD-10-CM | POA: Diagnosis not present

## 2020-08-05 DIAGNOSIS — R0781 Pleurodynia: Secondary | ICD-10-CM | POA: Diagnosis not present

## 2020-08-05 DIAGNOSIS — S0990XA Unspecified injury of head, initial encounter: Secondary | ICD-10-CM | POA: Diagnosis not present

## 2020-08-05 DIAGNOSIS — M25519 Pain in unspecified shoulder: Secondary | ICD-10-CM | POA: Diagnosis not present

## 2020-08-05 MED ORDER — TRAMADOL HCL 50 MG PO TABS: 50.0000 mg | ORAL_TABLET | Freq: Three times a day (TID) | ORAL | 0 refills | Status: AC

## 2020-08-05 MED ORDER — HYDROCODONE-ACETAMINOPHEN 5-325 MG PO TABS
1.0000 | ORAL_TABLET | Freq: Once | ORAL | Status: AC
  Administered 2020-08-05: 1 via ORAL
  Filled 2020-08-05: qty 1

## 2020-08-05 MED ORDER — HYDROCHLOROTHIAZIDE 25 MG PO TABS
25.0000 mg | ORAL_TABLET | Freq: Every day | ORAL | Status: DC
  Administered 2020-08-05 (×2): 25 mg via ORAL
  Filled 2020-08-05: qty 1

## 2020-08-05 MED ORDER — DICLOFENAC SODIUM 1 % EX GEL: 2.0000 g | Freq: Four times a day (QID) | CUTANEOUS | 0 refills | Status: DC | PRN

## 2020-08-05 NOTE — ED Notes (Signed)
EMS taking to back to facility.

## 2020-08-05 NOTE — Telephone Encounter (Signed)
Nichole [name] is requesting something for pain for patient. They say she is hurting in her back and side. She is completely out of hydrocodone . Can you send something in for pain to [facility]. Please advise

## 2020-08-05 NOTE — ED Triage Notes (Addendum)
Pt arrived [facility]. Pt c/o of right rib pain and right shoulder pain from a fall this morning at [facility]. Pt is hypertensive on arrival. 222/150.

## 2020-08-05 NOTE — Telephone Encounter (Signed)
I already sent them a script for 7 days of medication on 07/31/20.  Did they not get it?  Dr. [name]

## 2020-08-05 NOTE — Addendum Note (Signed)
Addended by: [name] on: 08/05/2020 05:22 PM   Modules accepted: Orders

## 2020-08-05 NOTE — Telephone Encounter (Signed)
Give pt tramadol for pain, will send in new script for this. Give tramadol 50mg  q8hrs daily for 10 days.     Thx,   Dr. 
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
## 2020-08-05 NOTE — Discharge Instructions (Signed)
You were seen in the emergency room today after a fall.  Your blood pressure was elevated today and we have given your morning dose of hydrochlorothiazide as well as your morning dose of Vicodin.  The CT scans of your head, neck along with x-rays showed no fractures or bleeding.  Please follow closely with your primary care doctor.  I have called in some topical pain medication to rub in your shoulder as needed for pain.

## 2020-08-05 NOTE — ED Notes (Signed)
Report called to [place] at [facility].

## 2020-08-06 DIAGNOSIS — U071 COVID-19: Secondary | ICD-10-CM | POA: Diagnosis not present

## 2020-08-06 DIAGNOSIS — Z20828 Contact with and (suspected) exposure to other viral communicable diseases: Secondary | ICD-10-CM | POA: Diagnosis not present

## 2020-08-06 NOTE — Telephone Encounter (Signed)
Order faxed to [facility] and verbal order given to med tech at [facility]. Verbalized understanding.

## 2020-08-06 NOTE — Telephone Encounter (Signed)
Yes, pls give orders to [facility] for starting Tramadol on 08/08/20, when the other order for norco runs out.  Pls fax to them.   Thanks,   Dr. [name]

## 2020-08-08 DIAGNOSIS — Z20828 Contact with and (suspected) exposure to other viral communicable diseases: Secondary | ICD-10-CM | POA: Diagnosis not present

## 2020-08-08 DIAGNOSIS — U071 COVID-19: Secondary | ICD-10-CM | POA: Diagnosis not present

## 2020-08-20 ENCOUNTER — Ambulatory Visit: Payer: Medicare Other | Admitting: Family Medicine

## 2020-08-22 ENCOUNTER — Ambulatory Visit (INDEPENDENT_AMBULATORY_CARE_PROVIDER_SITE_OTHER): Payer: Medicare Other | Admitting: Family Medicine

## 2020-08-22 ENCOUNTER — Encounter: Payer: Self-pay | Admitting: Family Medicine

## 2020-08-22 ENCOUNTER — Other Ambulatory Visit: Payer: Self-pay

## 2020-08-22 VITALS — BP 156/81 | HR 60 | Temp 97.7°F | Ht 65.0 in | Wt 137.0 lb

## 2020-08-22 DIAGNOSIS — F0391 Unspecified dementia with behavioral disturbance: Secondary | ICD-10-CM

## 2020-08-22 DIAGNOSIS — I10 Essential (primary) hypertension: Secondary | ICD-10-CM

## 2020-08-22 DIAGNOSIS — F03918 Unspecified dementia, unspecified severity, with other behavioral disturbance: Secondary | ICD-10-CM

## 2020-08-22 DIAGNOSIS — R296 Repeated falls: Secondary | ICD-10-CM

## 2020-08-22 DIAGNOSIS — S9031XD Contusion of right foot, subsequent encounter: Secondary | ICD-10-CM

## 2020-08-22 DIAGNOSIS — S22080D Wedge compression fracture of T11-T12 vertebra, subsequent encounter for fracture with routine healing: Secondary | ICD-10-CM

## 2020-08-22 DIAGNOSIS — M79671 Pain in right foot: Secondary | ICD-10-CM

## 2020-08-22 DIAGNOSIS — S20221D Contusion of right back wall of thorax, subsequent encounter: Secondary | ICD-10-CM

## 2020-08-22 DIAGNOSIS — R011 Cardiac murmur, unspecified: Secondary | ICD-10-CM | POA: Diagnosis not present

## 2020-08-22 DIAGNOSIS — M79604 Pain in right leg: Secondary | ICD-10-CM

## 2020-08-22 DIAGNOSIS — Z1329 Encounter for screening for other suspected endocrine disorder: Secondary | ICD-10-CM

## 2020-08-22 MED ORDER — DILTIAZEM HCL ER COATED BEADS 300 MG PO CP24: 300.0000 mg | ORAL_CAPSULE | Freq: Every day | ORAL | 0 refills | Status: DC

## 2020-08-22 NOTE — Progress Notes (Signed)
Patient ID: Nichole [name], female    DOB: [birth year], 75 y.o.   MRN: [id number]   Chief Complaint  Patient presents with  . Fall    ER follow up for fall with contusion to right hip and rt foot.   Subjective:    HPI  CC- f/u ER visit, seen with caregiver today from [facility].  Pt had fall 08/05/20  Sent to ER. Caregiver, not sure why she is falling, seems unsteady with walking and falling over with walking with her [place].  now has residual bruising on rt hip nad rt thoracic back and rt foot bruising. CT head negative.  Rib xray- no new fractures.  Seen 07/18/20- ortho for compression fracture T11. Had thoracic T11 fracture.  Pt supposed to seem them soon for repeat imaging 6 wk after that visit.  Pt doing well today.  Stating pain is minor in her back and in her right leg.  Imaging was showing no acute findings.  Pt not recalling the falls or why she's falling.  caregiver from [facility] stating she is standing with the [employer] and just goes down the opposite way.  Now in wheelchair to help prevent falls. Pt only on meloxicam for pain.   Medical History Nichole [name] has a past medical history of Ankle fracture, left (06/08/2014), Anxiety, Asthma, Colon polyp, COPD (chronic obstructive pulmonary disease) ([place]), Dementia ([place]), Depression, Diverticular disease, Hyperlipidemia, Hypertension, IFG (impaired fasting glucose), Lumbar burst fracture ([place]) (06/14/2014), Memory loss, Mild sleep apnea, and Multiple fractures of left foot (06/08/2014).   Outpatient Encounter Medications as of 08/22/2020  Medication Sig  . diltiazem (CARDIZEM CD) 300 MG 24 hr capsule Take 1 capsule (300 mg total) by mouth daily.  Marland Kitchen acetaminophen (TYLENOL) 500 MG tablet Take 1 tablet (500 mg total) by mouth 2 (two) times daily. For thoracic fracture.  Marland Kitchen albuterol (PROAIR HFA) 108 (90 Base) MCG/ACT inhaler Inhale 2 puffs into the lungs every 6 (six) hours as needed. for wheezing  . buPROPion (WELLBUTRIN XL) 150 MG  24 hr tablet Take one tablet po daily (Patient taking differently: Take 150 mg by mouth daily.)  . diclofenac Sodium (VOLTAREN) 1 % GEL Apply 2 g topically 4 (four) times daily as needed.  . donepezil (ARICEPT) 10 MG tablet TAKE (1) TABLET BY MOUTH EACH MORNING. (Patient taking differently: Take 10 mg by mouth in the morning.)  . doxazosin (CARDURA) 2 MG tablet TAKE (1) TABLET BY MOUTH ONCE DAILY. (Patient taking differently: Take 2 mg by mouth daily.)  . escitalopram (LEXAPRO) 20 MG tablet TAKE (1) TABLET BY MOUTH ONCE DAILY. (Patient taking differently: Take 20 mg by mouth daily.)  . hydrochlorothiazide (HYDRODIURIL) 25 MG tablet TAKE (1) TABLET BY MOUTH ONCE DAILY. (Patient taking differently: Take 25 mg by mouth daily.)  . meclizine (ANTIVERT) 25 MG tablet Take one tablet tid prn dizziness (Patient taking differently: Take 25 mg by mouth 3 (three) times daily as needed for dizziness.)  . meloxicam (MOBIC) 7.5 MG tablet TAKE (1) TABLET BY MOUTH ONCE DAILY. (Patient taking differently: Take 7.5 mg by mouth daily.)  . oxybutynin (DITROPAN-XL) 10 MG 24 hr tablet TAKE (1) TABLET BY MOUTH AT BEDTIME (Patient taking differently: Take 10 mg by mouth at bedtime. TAKE (1) TABLET BY MOUTH AT BEDTIME)  . [DISCONTINUED] diltiazem (CARDIZEM CD) 240 MG 24 hr capsule TAKE (1) CAPSULE BY MOUTH ONCE DAILY. (Patient taking differently: Take 240 mg by mouth daily.)   No facility-administered encounter medications on file as of 08/22/2020.  Review of Systems  Constitutional: Negative for chills and fever.  HENT: Negative for congestion, rhinorrhea and sore throat.   Respiratory: Negative for cough, shortness of breath and wheezing.   Cardiovascular: Negative for chest pain and leg swelling.  Gastrointestinal: Negative for abdominal pain, diarrhea, nausea and vomiting.  Genitourinary: Negative for dysuria and frequency.  Musculoskeletal: Positive for back pain (chronic). Negative for arthralgias and neck pain.        Rt back contusion, and rt foot contusion. Recurrent falls  Skin: Negative for rash.  Neurological: Negative for dizziness, speech difficulty, weakness and headaches.     Vitals BP (!) 156/81   Pulse 60   Temp 97.7 F (36.5 C) (Oral)   Ht $R'5\' 5"'rz$  (1.651 m)   Wt 137 lb (62.1 kg)   SpO2 97%   BMI 22.80 kg/m   Objective:   Physical Exam Vitals and nursing note reviewed.  Constitutional:      General: She is not in acute distress.    Appearance: Normal appearance. She is not ill-appearing or toxic-appearing.  HENT:     Head: Normocephalic and atraumatic.     Nose: Nose normal.     Mouth/Throat:     Mouth: Mucous membranes are moist.     Pharynx: Oropharynx is clear.  Eyes:     Extraocular Movements: Extraocular movements intact.     Conjunctiva/sclera: Conjunctivae normal.     Pupils: Pupils are equal, round, and reactive to light.  Cardiovascular:     Rate and Rhythm: Normal rate and regular rhythm.     Pulses: Normal pulses.     Heart sounds: Murmur (systolic 4/6.) heard.    Pulmonary:     Effort: Pulmonary effort is normal. No respiratory distress.     Breath sounds: Normal breath sounds. No wheezing, rhonchi or rales.  Musculoskeletal:        General: Normal range of motion.       Back:     Right lower leg: No edema.     Left lower leg: No edema.     Comments: +rt thigh ttp,  +rt medial aspect with ecchymosis., normal rom of ankle foot, and 2nd and 3rd toe with ecchymosis.  Non tender on palpation of toes.   Skin:    General: Skin is warm and dry.     Findings: No lesion or rash.  Neurological:     General: No focal deficit present.     Mental Status: She is alert. Mental status is at baseline.     Motor: No weakness.     Comments: Oriented to person only.  +sitting in wheelchair.  Psychiatric:        Mood and Affect: Mood normal.        Behavior: Behavior normal.     Assessment and Plan   1. Systolic murmur - Ambulatory referral to  Cardiology - CBC - CMP14+EGFR  2. Closed wedge compression fracture of T11 vertebra with routine healing, subsequent encounter  3. Frequent falls - Ambulatory referral to Cardiology  4. Contusion of right back wall of thorax, subsequent encounter  5. Contusion of right foot, subsequent encounter  6. Chronic dementia with behavioral disturbance ([place])  7. Essential hypertension, benign - CBC - CMP14+EGFR - Lipid panel  8. Thyroid disorder screen - TSH   htn- uncontrolled.   Repeat 162/84 in office.  Will increase cardizem from $RemoveBefor'240mg'[id number]$  to $R'300mg'fE$  CD.  Systolic murmur- will refer to cardiology for further evaluation.  Pt having increased frequency  of falls.   Fall- contusion to rt back and rt foot- doing well and pt on mobic. Compression fx T11- from 11/21. Pt to f/u with ortho for repeat xray in a few wks.  F/u 1 mo recheck bp.

## 2020-08-29 ENCOUNTER — Telehealth: Payer: Self-pay | Admitting: Family Medicine

## 2020-08-29 DIAGNOSIS — S22000A Wedge compression fracture of unspecified thoracic vertebra, initial encounter for closed fracture: Secondary | ICD-10-CM

## 2020-08-29 MED ORDER — ACETAMINOPHEN 500 MG PO TABS: 500.0000 mg | ORAL_TABLET | Freq: Four times a day (QID) | ORAL | 1 refills | Status: AC | PRN

## 2020-08-29 NOTE — Telephone Encounter (Signed)
Yes pls give tylenol 500mg  q6hrs prn fever/pain.  Do they need to send to the pharmacy?  Dr. [place]

## 2020-08-29 NOTE — Addendum Note (Signed)
Addended by: [name] on: 08/29/2020 04:20 PM   Modules accepted: Orders

## 2020-08-29 NOTE — Telephone Encounter (Signed)
Nettie Elm calling from [facility]. Therapy was in to see patient and reported temp of 101, oxygen was 92%, BP 150/72. Facility does test for COVID on Tuesday and Thursday; they were testing as we spoke. Room mate was sent out this morning due to not feeling well. Pt does not have a prn order for tylenol. Facility is wanting to know what should they do and can they have a PRN order for Tylenol for patient. Please advise. Thank you

## 2020-08-29 NOTE — Telephone Encounter (Signed)
Nettie Elm at [facility] contacted. They would need the Tylenol order sent to [facility]. Please advise. Thank you

## 2020-09-18 ENCOUNTER — Encounter: Payer: Self-pay | Admitting: Family Medicine

## 2020-09-18 ENCOUNTER — Other Ambulatory Visit: Payer: Self-pay

## 2020-09-18 ENCOUNTER — Telehealth: Payer: Self-pay | Admitting: Family Medicine

## 2020-09-18 ENCOUNTER — Ambulatory Visit (INDEPENDENT_AMBULATORY_CARE_PROVIDER_SITE_OTHER): Payer: Medicare Other | Admitting: Family Medicine

## 2020-09-18 VITALS — BP 168/82 | Ht 65.0 in | Wt 137.0 lb

## 2020-09-18 DIAGNOSIS — I10 Essential (primary) hypertension: Secondary | ICD-10-CM | POA: Diagnosis not present

## 2020-09-18 DIAGNOSIS — G309 Alzheimer's disease, unspecified: Secondary | ICD-10-CM | POA: Diagnosis not present

## 2020-09-18 DIAGNOSIS — R011 Cardiac murmur, unspecified: Secondary | ICD-10-CM

## 2020-09-18 DIAGNOSIS — Z9181 History of falling: Secondary | ICD-10-CM | POA: Diagnosis not present

## 2020-09-18 DIAGNOSIS — M25511 Pain in right shoulder: Secondary | ICD-10-CM | POA: Diagnosis not present

## 2020-09-18 DIAGNOSIS — F0280 Dementia in other diseases classified elsewhere without behavioral disturbance: Secondary | ICD-10-CM

## 2020-09-18 DIAGNOSIS — R296 Repeated falls: Secondary | ICD-10-CM

## 2020-09-18 MED ORDER — DICLOFENAC SODIUM 1 % EX GEL: 2.0000 g | Freq: Two times a day (BID) | CUTANEOUS | 0 refills | Status: AC

## 2020-09-18 NOTE — Progress Notes (Signed)
Patient ID: Nichole [name], female    DOB: 07-17-1946, 75 y.o.   MRN: [id number]   Chief Complaint  Patient presents with  . Hypertension    Follow up  . Fall    Rt shoulder pain   Subjective:    HPI  Pt seen today with caregiver from [facility].  Pt had a fall on 08/05/20.  Pt is unsure if she fell.  Has h/o dementia and since going to [place] has had multiple falls.  Is using wheelchair while there to keep from falling. On last ER visit for fall, had rt shoulder pain, xray is negative for fx.  On chest and rib xray- h/o chronic multilevel right rib deformities. Ct head and neck- normal, stable. No fractures or bleeding in the brain.  Overall today she is in good mood and not appearing to be in pain.  When palpation of rt shoulder is in extreme pain.  Medical History Nichole [name] has a past medical history of Ankle fracture, left (06/08/2014), Anxiety, Asthma, Colon polyp, COPD (chronic obstructive pulmonary disease) (HCC), Dementia (HCC), Depression, Diverticular disease, Hyperlipidemia, Hypertension, IFG (impaired fasting glucose), Lumbar burst fracture (HCC) (06/14/2014), Memory loss, Mild sleep apnea, and Multiple fractures of left foot (06/08/2014).   Outpatient Encounter Medications as of 09/18/2020  Medication Sig  . acetaminophen (TYLENOL) 500 MG tablet Take 1 tablet (500 mg total) by mouth every 6 (six) hours as needed for mild pain or fever. For thoracic fracture.  Marland Kitchen albuterol (PROAIR HFA) 108 (90 Base) MCG/ACT inhaler Inhale 2 puffs into the lungs every 6 (six) hours as needed. for wheezing  . buPROPion (WELLBUTRIN XL) 150 MG 24 hr tablet Take one tablet po daily (Patient taking differently: Take 150 mg by mouth daily.)  . [EXPIRED] diclofenac Sodium (VOLTAREN) 1 % GEL Apply 2 g topically 2 (two) times daily for 7 days. To the right shoulder.  . diltiazem (CARDIZEM CD) 300 MG 24 hr capsule Take 1 capsule (300 mg total) by mouth daily.  Marland Kitchen donepezil (ARICEPT) 10 MG tablet TAKE  (1) TABLET BY MOUTH EACH MORNING. (Patient taking differently: Take 10 mg by mouth in the morning.)  . doxazosin (CARDURA) 2 MG tablet TAKE (1) TABLET BY MOUTH ONCE DAILY. (Patient taking differently: Take 2 mg by mouth daily.)  . escitalopram (LEXAPRO) 20 MG tablet TAKE (1) TABLET BY MOUTH ONCE DAILY. (Patient taking differently: Take 20 mg by mouth daily.)  . hydrochlorothiazide (HYDRODIURIL) 25 MG tablet TAKE (1) TABLET BY MOUTH ONCE DAILY. (Patient taking differently: Take 25 mg by mouth daily.)  . meclizine (ANTIVERT) 25 MG tablet Take one tablet tid prn dizziness (Patient taking differently: Take 25 mg by mouth 3 (three) times daily as needed for dizziness.)  . meloxicam (MOBIC) 7.5 MG tablet TAKE (1) TABLET BY MOUTH ONCE DAILY. (Patient taking differently: Take 7.5 mg by mouth daily.)  . oxybutynin (DITROPAN-XL) 10 MG 24 hr tablet TAKE (1) TABLET BY MOUTH AT BEDTIME (Patient taking differently: Take 10 mg by mouth at bedtime. TAKE (1) TABLET BY MOUTH AT BEDTIME)  . [DISCONTINUED] acetaminophen (TYLENOL) 500 MG tablet Take 1 tablet (500 mg total) by mouth 2 (two) times daily. For thoracic fracture.  . [DISCONTINUED] diclofenac Sodium (VOLTAREN) 1 % GEL Apply 2 g topically 4 (four) times daily as needed.   No facility-administered encounter medications on file as of 09/18/2020.     Review of Systems  Constitutional: Negative for chills and fever.  HENT: Negative for congestion, rhinorrhea and sore throat.  Respiratory: Negative for cough, shortness of breath and wheezing.   Cardiovascular: Negative for chest pain and leg swelling.  Gastrointestinal: Negative for abdominal pain, diarrhea, nausea and vomiting.  Genitourinary: Negative for dysuria and frequency.  Musculoskeletal: Negative for arthralgias (rt shoulder pain) and back pain.  Skin: Negative for rash.  Neurological: Negative for dizziness, weakness and headaches.     Vitals BP (!) 168/82   Ht 5\' 5"  (1.651 m)   Wt 137 lb  (62.1 kg)   BMI 22.80 kg/m   Objective:   Physical Exam Vitals and nursing note reviewed. Exam conducted with a chaperone present.  Constitutional:      General: She is not in acute distress.    Appearance: Normal appearance. She is not ill-appearing.  HENT:     Head: Normocephalic and atraumatic.     Nose: Nose normal.  Cardiovascular:     Rate and Rhythm: Normal rate and regular rhythm.     Pulses: Normal pulses.     Heart sounds: Murmur (systolic 4/6) heard.    Pulmonary:     Effort: Pulmonary effort is normal. No respiratory distress.     Breath sounds: Normal breath sounds. No wheezing, rhonchi or rales.  Musculoskeletal:        General: Tenderness (rt shoulder) present. Normal range of motion.     Cervical back: Normal range of motion.     Right lower leg: No edema.     Left lower leg: No edema.     Comments: +sitting in wheelchair +ttp anterior rt shoulder.  No pain over rt clavicle.  Dec rom with abduction rt shoulder.  No ttp over left shoulder.    Skin:    General: Skin is warm and dry.     Findings: No lesion or rash.  Neurological:     General: No focal deficit present.     Mental Status: She is alert and oriented to person, place, and time.     Cranial Nerves: No cranial nerve deficit.  Psychiatric:        Mood and Affect: Mood normal.        Behavior: Behavior normal.        Thought Content: Thought content normal.        Judgment: Judgment normal.      Assessment and Plan   1. Acute pain of right shoulder  2. History of recent fall  3. Essential hypertension, benign  4. Alzheimer's disease (HCC)  5. Frequent falls    htn- uncontrolled.  Not sure if just anxious in room when coming to office.  Just increased the cardizem to 300mg  CD on last visit, was at 240mg  CD.  BP still elevated on multiple rechecks.    160/80 on repeat.   Will have center check bp 2x per day and call with numbers after 7 days.  Also gave orders to apply voltaren gel  to right shoulder bid for 7 days. Pt already taking mobic 7.5mg  daily.  Pt has order for tylenol 500mg  prn.  Checking on referral to cardiologist.  Pt hasn't f/u with cardiologist about systolic murmur on last visit.  Dementia- stable. Cont with aricept. Cont to use wheelchair to help with preventing falls.   Rt shoulder pain, recent fall.  -image reviewed, no fracture. - tylenol given every 6hrs for pain.  F/u 66mo for recheck. htn and falls.

## 2020-09-18 NOTE — Telephone Encounter (Signed)
Referral ordered in EPIC. 

## 2020-09-19 ENCOUNTER — Other Ambulatory Visit: Payer: Self-pay | Admitting: *Deleted

## 2020-09-19 DIAGNOSIS — F0391 Unspecified dementia with behavioral disturbance: Secondary | ICD-10-CM

## 2020-09-19 DIAGNOSIS — R8290 Unspecified abnormal findings in urine: Secondary | ICD-10-CM

## 2020-09-19 DIAGNOSIS — F03918 Unspecified dementia, unspecified severity, with other behavioral disturbance: Secondary | ICD-10-CM

## 2020-09-24 ENCOUNTER — Telehealth: Payer: Self-pay

## 2020-09-24 DIAGNOSIS — R011 Cardiac murmur, unspecified: Secondary | ICD-10-CM

## 2020-09-24 NOTE — Progress Notes (Signed)
CARDIOLOGY CONSULT NOTE       Patient ID: Nichole [name] MRN: [id number] DOB/AGE: August 14, 1946 75 y.o.  Admit date: (Not on file) Referring Physician: [name] Primary Physician: [name], DO Primary Cardiologist: New Reason for Consultation: Murmur  Active Problems:   * No active hospital problems. *   HPI:  75 y.o. referred by Dr [name] for systolic murmur. History of COPD/Asthma, HTL, HTN. She has poor balance with dementia. Has frequent falls Her cardizem was increased last month for elevated readings. No history of cardiac issues rheumatic fever. SBE, syncope , chest pain palpitations. She is hard of hearing No cardiac complaints   TTE done today showed normal EF mild LVH AV sclerosis with mean gradient only 7 mmHg   ROS All other systems reviewed and negative except as noted above  Past Medical History:  Diagnosis Date  . Ankle fracture, left 06/08/2014  . Anxiety   . Asthma   . Colon polyp   . COPD (chronic obstructive pulmonary disease) (HCC)   . Dementia (HCC)   . Depression   . Diverticular disease   . Hyperlipidemia   . Hypertension   . IFG (impaired fasting glucose)   . Lumbar burst fracture (HCC) 06/14/2014  . Memory loss   . Mild sleep apnea   . Multiple fractures of left foot 06/08/2014    Family History  Problem Relation Age of Onset  . Hypertension Father   . Diabetes Father   . Heart attack Father     Social History   Socioeconomic History  . Marital status: Married    Spouse name: Nichole [name]  . Number of children: 3  . Years of education: GED  . Highest education level: Not on file  Occupational History    Comment: Retired  Tobacco Use  . Smoking status: Former Smoker    Packs/day: 0.50    Years: 32.00    Pack years: 16.00    Types: Cigarettes    Start date: 04/11/1962    Quit date: 03/10/1994    Years since quitting: 26.5  . Smokeless tobacco: Never Used  Vaping Use  . Vaping Use: Never used  Substance and Sexual Activity  .  Alcohol use: No    Alcohol/week: 0.0 standard drinks  . Drug use: No  . Sexual activity: Never  Other Topics Concern  . Not on file  Social History Narrative   Patient lives at home with her husband Nichole [name]). Patient is retired.    GED.   Caffeine- coffee- 1 cup daily   Right handed.   Social Determinants of Health   Financial Resource Strain: Not on file  Food Insecurity: Not on file  Transportation Needs: Not on file  Physical Activity: Not on file  Stress: Not on file  Social Connections: Not on file  Intimate Partner Violence: Not on file    Past Surgical History:  Procedure Laterality Date  . ABDOMINAL HYSTERECTOMY    . ABDOMINAL SURGERY    . ANTERIOR LAT LUMBAR FUSION Left 06/22/2014   Procedure: Left Lumbar one Corpectomy with Fusion Thoracic twelve-Lumbar two,lateral plating,posterior percutaneous pedicle screws;  Surgeon: Temple Pacini, MD;  Location: [facility];  Service: Neurosurgery;  Laterality: Left;  . APPENDECTOMY    . BACK SURGERY    . CERVICAL FUSION  October 2007  . LUMBAR PERCUTANEOUS PEDICLE SCREW 1 LEVEL N/A 06/22/2014   Procedure: LUMBAR PERCUTANEOUS PEDICLE SCREW 1 LEVEL;  Surgeon: Temple Pacini, MD;  Location: [facility];  Service: Neurosurgery;  Laterality: N/A;  . NECK SURGERY    . ORIF TIBIA FRACTURE Left 06/04/2014   Procedure: OPEN REDUCTION INTERNAL FIXATION (ORIF) PILON FRACTURE AND OPEN REDUCTION INTERNAL FIXATION (ORIF) OF SYNDESMOTIC FRACTURE;  Surgeon: Eldred Manges, MD;  Location: [facility] OR;  Service: Orthopedics;  Laterality: Left;  Big C-arm, Flat Jackson, Biomet Anterolateral plates, cancellous bone graft 20cc. Surgeon will call. Available after 5pm  . ORIF TIBIA FRACTURE Left 06/14/2014   DR Gerlene Fee  . SIGMOIDECTOMY    . TONSILLECTOMY        Current Outpatient Medications:  .  acetaminophen (TYLENOL) 500 MG tablet, Take 1 tablet (500 mg total) by mouth every 6 (six) hours as needed for mild pain or fever. For thoracic fracture., Disp: 30  tablet, Rfl: 1 .  albuterol (PROAIR HFA) 108 (90 Base) MCG/ACT inhaler, Inhale 2 puffs into the lungs every 6 (six) hours as needed. for wheezing, Disp: 17 g, Rfl: 3 .  buPROPion (WELLBUTRIN XL) 150 MG 24 hr tablet, Take one tablet po daily (Patient taking differently: Take 150 mg by mouth daily.), Disp: 90 tablet, Rfl: 1 .  diltiazem (CARDIZEM CD) 300 MG 24 hr capsule, Take 1 capsule (300 mg total) by mouth daily., Disp: 90 capsule, Rfl: 0 .  donepezil (ARICEPT) 10 MG tablet, TAKE (1) TABLET BY MOUTH EACH MORNING. (Patient taking differently: Take 10 mg by mouth in the morning.), Disp: 90 tablet, Rfl: 1 .  doxazosin (CARDURA) 2 MG tablet, TAKE (1) TABLET BY MOUTH ONCE DAILY. (Patient taking differently: Take 2 mg by mouth daily.), Disp: 30 tablet, Rfl: 5 .  escitalopram (LEXAPRO) 20 MG tablet, TAKE (1) TABLET BY MOUTH ONCE DAILY. (Patient taking differently: Take 20 mg by mouth daily.), Disp: 30 tablet, Rfl: 5 .  hydrochlorothiazide (HYDRODIURIL) 25 MG tablet, TAKE (1) TABLET BY MOUTH ONCE DAILY. (Patient taking differently: Take 25 mg by mouth daily.), Disp: 30 tablet, Rfl: 5 .  meclizine (ANTIVERT) 25 MG tablet, Take one tablet tid prn dizziness (Patient taking differently: Take 25 mg by mouth 3 (three) times daily as needed for dizziness.), Disp: 30 tablet, Rfl: 11 .  meloxicam (MOBIC) 7.5 MG tablet, TAKE (1) TABLET BY MOUTH ONCE DAILY. (Patient taking differently: Take 7.5 mg by mouth daily.), Disp: 30 tablet, Rfl: 5 .  oxybutynin (DITROPAN-XL) 10 MG 24 hr tablet, TAKE (1) TABLET BY MOUTH AT BEDTIME (Patient taking differently: Take 10 mg by mouth at bedtime. TAKE (1) TABLET BY MOUTH AT BEDTIME), Disp: 90 tablet, Rfl: 1    Physical Exam: Blood pressure 118/72, pulse (!) 57, height 5\' 4"  (1.626 m), weight 61 kg, SpO2 96 %.    Affect appropriate Frail elderly female  HEENT: normal Neck supple with no adenopathy JVP normal no bruits no thyromegaly post cervical fusion  Lungs clear with no  wheezing and good diaphragmatic motion Heart:  S1/S2 SEM 3/6  no rub, gallop or click PMI normal Abdomen: benighn, BS positve, no tenderness, no AAA no bruit.  No HSM or HJR Distal pulses intact with no bruits No edema Neuro non-focal Skin warm and dry No muscular weakness   Labs:   Lab Results  Component Value Date   WBC 16.4 (H) 09/06/2019   HGB 13.6 09/06/2019   HCT 42.5 09/06/2019   MCV 89.7 09/06/2019   PLT 384 09/06/2019   No results for input(s): NA, K, CL, CO2, BUN, CREATININE, CALCIUM, PROT, BILITOT, ALKPHOS, ALT, AST, GLUCOSE in the last 168 hours.  Invalid input(s): LABALBU  Lab Results  Component Value Date   TROPONINI <0.30 05/29/2014    Lab Results  Component Value Date   CHOL 232 (H) 09/28/2018   CHOL 223 (H) 08/26/2017   Lab Results  Component Value Date   HDL 55 09/28/2018   HDL 70 08/26/2017   Lab Results  Component Value Date   LDLCALC 157 (H) 09/28/2018   LDLCALC 135 (H) 08/26/2017   Lab Results  Component Value Date   TRIG 99 09/28/2018   TRIG 91 08/26/2017   Lab Results  Component Value Date   CHOLHDL 4.2 09/28/2018   CHOLHDL 3.2 08/26/2017   No results found for: LDLDIRECT    Radiology: No results found.  EKG: 08/05/20 SR rate 52 RBBB    ASSESSMENT AND PLAN:   1. Murmur:  AV sclerosis not clinically significant by echo done today normal EF Given age and dementia only consider f/u for new symptoms  2. HTN:  Continue cardizem and cardura along with diuretic f/u primary  3. Dementia:  On aricept ECG with some bradycardia and RBBB monitor 4. Anxiety/Depression:  On Lexapro f/u primary   F/U PRN   Signed: Charlton Haws 10/02/2020, 10:41 AM

## 2020-09-24 NOTE — Telephone Encounter (Signed)
Echo order entered, will forward to scheduling.

## 2020-09-24 NOTE — Telephone Encounter (Signed)
-----   Message from Wendall Stade, MD sent at 09/24/2020  9:30 AM EST ----- New patient 2/16 for murmur can order echo and try to get done before visit

## 2020-09-29 LAB — URINE CULTURE

## 2020-09-29 LAB — URINALYSIS, ROUTINE W REFLEX MICROSCOPIC
Bilirubin, UA: NEGATIVE
Glucose, UA: NEGATIVE
Ketones, UA: NEGATIVE
Leukocytes,UA: NEGATIVE
Nitrite, UA: NEGATIVE
Protein,UA: NEGATIVE
RBC, UA: NEGATIVE
Specific Gravity, UA: 1.018 (ref 1.005–1.030)
Urobilinogen, Ur: 1 mg/dL (ref 0.2–1.0)
pH, UA: 6.5 (ref 5.0–7.5)

## 2020-10-02 ENCOUNTER — Ambulatory Visit (HOSPITAL_COMMUNITY)
Admission: RE | Admit: 2020-10-02 | Discharge: 2020-10-02 | Disposition: A | Discharge status: Home / Self Care | Payer: Medicare Other | Source: Ambulatory Visit | Attending: Cardiovascular Disease | Admitting: Cardiovascular Disease

## 2020-10-02 ENCOUNTER — Ambulatory Visit (INDEPENDENT_AMBULATORY_CARE_PROVIDER_SITE_OTHER): Payer: Medicare Other | Admitting: Cardiovascular Disease

## 2020-10-02 ENCOUNTER — Encounter: Payer: Self-pay | Admitting: Cardiovascular Disease

## 2020-10-02 ENCOUNTER — Other Ambulatory Visit: Payer: Self-pay

## 2020-10-02 VITALS — BP 118/72 | HR 57 | Ht 64.0 in | Wt 134.4 lb

## 2020-10-02 DIAGNOSIS — R011 Cardiac murmur, unspecified: Secondary | ICD-10-CM | POA: Insufficient documentation

## 2020-10-02 LAB — ECHOCARDIOGRAM COMPLETE
Area-P 1/2: 2.17 cm2
MV VTI: 2.07 cm2
P 1/2 time: 395 msec
S' Lateral: 2.2 cm

## 2020-10-02 NOTE — Patient Instructions (Signed)
Medication Instructions:  Your physician recommends that you continue on your current medications as directed. Please refer to the Current Medication list given to you today.  *If you need a refill on your cardiac medications before your next appointment, please call your pharmacy*   Lab Work: NONE   If you have labs (blood work) drawn today and your tests are completely normal, you will receive your results only by: MyChart Message (if you have MyChart) OR A paper copy in the mail If you have any lab test that is abnormal or we need to change your treatment, we will call you to review the results.   Testing/Procedures: NONE    Follow-Up: At [facility], you and your health needs are our priority.  As part of our continuing mission to provide you with exceptional heart care, we have created designated Provider Care Teams.  These Care Teams include your primary Cardiologist (physician) and Advanced Practice Providers (APPs -  Physician Assistants and Nurse Practitioners) who all work together to provide you with the care you need, when you need it.  We recommend signing up for the patient portal called "MyChart".  Sign up information is provided on this After Visit Summary.  MyChart is used to connect with patients for Virtual Visits (Telemedicine).  Patients are able to view lab/test results, encounter notes, upcoming appointments, etc.  Non-urgent messages can be sent to your provider as well.   To learn more about what you can do with MyChart, go to [email or web address].    Your next appointment:    As Needed   The format for your next appointment:   In Person  Provider:   Peter Nishan, MD   Other Instructions Thank you for choosing [facility]!    

## 2020-10-02 NOTE — Progress Notes (Signed)
*  PRELIMINARY RESULTS* Echocardiogram 2D Echocardiogram has been performed.  Stacey Drain 10/02/2020, 10:20 AM

## 2020-10-30 ENCOUNTER — Ambulatory Visit: Payer: Medicare Other | Admitting: Family Medicine

## 2020-11-16 ENCOUNTER — Emergency Department (HOSPITAL_COMMUNITY): Payer: Medicare Other

## 2020-11-16 ENCOUNTER — Encounter (HOSPITAL_COMMUNITY): Payer: Self-pay

## 2020-11-16 ENCOUNTER — Other Ambulatory Visit: Payer: Self-pay

## 2020-11-16 ENCOUNTER — Inpatient Hospital Stay (HOSPITAL_COMMUNITY)
Admission: EM | Admit: 2020-11-16 | Discharge: 2020-11-22 | DRG: 086 | Disposition: A | Discharge status: Skilled Nursing Facility | Payer: Medicare Other | Source: Skilled Nursing Facility | Attending: Neurological Surgery | Admitting: Neurological Surgery

## 2020-11-16 DIAGNOSIS — G319 Degenerative disease of nervous system, unspecified: Secondary | ICD-10-CM | POA: Diagnosis present

## 2020-11-16 DIAGNOSIS — W19XXXA Unspecified fall, initial encounter: Secondary | ICD-10-CM | POA: Diagnosis present

## 2020-11-16 DIAGNOSIS — I10 Essential (primary) hypertension: Secondary | ICD-10-CM | POA: Diagnosis present

## 2020-11-16 DIAGNOSIS — E785 Hyperlipidemia, unspecified: Secondary | ICD-10-CM | POA: Diagnosis present

## 2020-11-16 DIAGNOSIS — Z885 Allergy status to narcotic agent status: Secondary | ICD-10-CM | POA: Diagnosis not present

## 2020-11-16 DIAGNOSIS — F0280 Dementia in other diseases classified elsewhere without behavioral disturbance: Secondary | ICD-10-CM | POA: Diagnosis present

## 2020-11-16 DIAGNOSIS — Y92129 Unspecified place in nursing home as the place of occurrence of the external cause: Secondary | ICD-10-CM | POA: Diagnosis not present

## 2020-11-16 DIAGNOSIS — Z833 Family history of diabetes mellitus: Secondary | ICD-10-CM | POA: Diagnosis not present

## 2020-11-16 DIAGNOSIS — S06340A Traumatic hemorrhage of right cerebrum without loss of consciousness, initial encounter: Secondary | ICD-10-CM

## 2020-11-16 DIAGNOSIS — Z79899 Other long term (current) drug therapy: Secondary | ICD-10-CM | POA: Diagnosis not present

## 2020-11-16 DIAGNOSIS — S0990XA Unspecified injury of head, initial encounter: Secondary | ICD-10-CM | POA: Diagnosis present

## 2020-11-16 DIAGNOSIS — Z8249 Family history of ischemic heart disease and other diseases of the circulatory system: Secondary | ICD-10-CM | POA: Diagnosis not present

## 2020-11-16 DIAGNOSIS — Z751 Person awaiting admission to adequate facility elsewhere: Secondary | ICD-10-CM

## 2020-11-16 DIAGNOSIS — F419 Anxiety disorder, unspecified: Secondary | ICD-10-CM | POA: Diagnosis present

## 2020-11-16 DIAGNOSIS — R400 Somnolence: Secondary | ICD-10-CM | POA: Diagnosis present

## 2020-11-16 DIAGNOSIS — Z880 Allergy status to penicillin: Secondary | ICD-10-CM | POA: Diagnosis not present

## 2020-11-16 DIAGNOSIS — I609 Nontraumatic subarachnoid hemorrhage, unspecified: Secondary | ICD-10-CM | POA: Diagnosis not present

## 2020-11-16 DIAGNOSIS — R4701 Aphasia: Secondary | ICD-10-CM | POA: Diagnosis present

## 2020-11-16 DIAGNOSIS — G4730 Sleep apnea, unspecified: Secondary | ICD-10-CM | POA: Diagnosis present

## 2020-11-16 DIAGNOSIS — Z981 Arthrodesis status: Secondary | ICD-10-CM

## 2020-11-16 DIAGNOSIS — Z87891 Personal history of nicotine dependence: Secondary | ICD-10-CM | POA: Diagnosis not present

## 2020-11-16 DIAGNOSIS — S066X0A Traumatic subarachnoid hemorrhage without loss of consciousness, initial encounter: Principal | ICD-10-CM | POA: Diagnosis present

## 2020-11-16 DIAGNOSIS — Z791 Long term (current) use of non-steroidal anti-inflammatories (NSAID): Secondary | ICD-10-CM | POA: Diagnosis not present

## 2020-11-16 DIAGNOSIS — J449 Chronic obstructive pulmonary disease, unspecified: Secondary | ICD-10-CM | POA: Diagnosis present

## 2020-11-16 DIAGNOSIS — F0390 Unspecified dementia without behavioral disturbance: Secondary | ICD-10-CM | POA: Diagnosis not present

## 2020-11-16 DIAGNOSIS — Z66 Do not resuscitate: Secondary | ICD-10-CM | POA: Diagnosis not present

## 2020-11-16 DIAGNOSIS — Z7189 Other specified counseling: Secondary | ICD-10-CM | POA: Diagnosis not present

## 2020-11-16 DIAGNOSIS — F32A Depression, unspecified: Secondary | ICD-10-CM | POA: Diagnosis present

## 2020-11-16 DIAGNOSIS — Z91013 Allergy to seafood: Secondary | ICD-10-CM

## 2020-11-16 DIAGNOSIS — G309 Alzheimer's disease, unspecified: Secondary | ICD-10-CM | POA: Diagnosis present

## 2020-11-16 DIAGNOSIS — Z20822 Contact with and (suspected) exposure to covid-19: Secondary | ICD-10-CM | POA: Diagnosis present

## 2020-11-16 LAB — COMPREHENSIVE METABOLIC PANEL
ALT: 17 U/L (ref 0–44)
AST: 21 U/L (ref 15–41)
Albumin: 3.5 g/dL (ref 3.5–5.0)
Alkaline Phosphatase: 76 U/L (ref 38–126)
Anion gap: 9 (ref 5–15)
BUN: 15 mg/dL (ref 8–23)
CO2: 28 mmol/L (ref 22–32)
Calcium: 8.7 mg/dL — ABNORMAL LOW (ref 8.9–10.3)
Chloride: 105 mmol/L (ref 98–111)
Creatinine, Ser: 0.45 mg/dL (ref 0.44–1.00)
GFR, Estimated: >60 mL/min (ref >60)
Glucose, Bld: 100 mg/dL — ABNORMAL HIGH (ref 70–99)
Potassium: 3.7 mmol/L (ref 3.5–5.1)
Sodium: 142 mmol/L (ref 135–145)
Total Bilirubin: 0.7 mg/dL (ref 0.3–1.2)
Total Protein: 6.2 g/dL — ABNORMAL LOW (ref 6.5–8.1)

## 2020-11-16 LAB — TYPE AND SCREEN
ABO/RH(D): A NEG
ABO/RH(D): A NEG
Antibody Screen: NEGATIVE
Antibody Screen: NEGATIVE

## 2020-11-16 LAB — CBC WITH DIFFERENTIAL/PLATELET
Abs Immature Granulocytes: 0.02 10*3/uL (ref 0.00–0.07)
Basophils Absolute: 0 10*3/uL (ref 0.0–0.1)
Basophils Relative: 0 %
Eosinophils Absolute: 0.2 10*3/uL (ref 0.0–0.5)
Eosinophils Relative: 3 %
HCT: 43.1 % (ref 36.0–46.0)
Hemoglobin: 13.9 g/dL (ref 12.0–15.0)
Immature Granulocytes: 0 %
Lymphocytes Relative: 11 %
Lymphs Abs: 0.7 10*3/uL (ref 0.7–4.0)
MCH: 29.1 pg (ref 26.0–34.0)
MCHC: 32.3 g/dL (ref 30.0–36.0)
MCV: 90.2 fL (ref 80.0–100.0)
Monocytes Absolute: 0.4 10*3/uL (ref 0.1–1.0)
Monocytes Relative: 5 %
Neutro Abs: 5.4 10*3/uL (ref 1.7–7.7)
Neutrophils Relative %: 81 %
Platelets: 401 10*3/uL — ABNORMAL HIGH (ref 150–400)
RBC: 4.78 MIL/uL (ref 3.87–5.11)
RDW: 13.8 % (ref 11.5–15.5)
WBC: 6.7 10*3/uL (ref 4.0–10.5)
nRBC: 0 % (ref 0.0–0.2)

## 2020-11-16 LAB — RESP PANEL BY RT-PCR (FLU A&B, COVID) ARPGX2
Influenza A by PCR: NEGATIVE
Influenza B by PCR: NEGATIVE
SARS Coronavirus 2 by RT PCR: NEGATIVE

## 2020-11-16 LAB — PROTIME-INR
INR: 1.1 (ref 0.8–1.2)
Prothrombin Time: 13.5 seconds (ref 11.4–15.2)

## 2020-11-16 MED ORDER — ALBUTEROL SULFATE (2.5 MG/3ML) 0.083% IN NEBU: 3.0000 mL | INHALATION_SOLUTION | Freq: Four times a day (QID) | RESPIRATORY_TRACT | Status: DC | PRN

## 2020-11-16 MED ORDER — DILTIAZEM HCL ER COATED BEADS 180 MG PO CP24
300.0000 mg | ORAL_CAPSULE | Freq: Every day | ORAL | Status: DC
  Administered 2020-11-17 – 2020-11-22 (×6): 300 mg via ORAL
  Filled 2020-11-16 (×6): qty 1

## 2020-11-16 MED ORDER — DOXAZOSIN MESYLATE 2 MG PO TABS
2.0000 mg | ORAL_TABLET | Freq: Every day | ORAL | Status: DC
  Administered 2020-11-17 – 2020-11-22 (×6): 2 mg via ORAL
  Filled 2020-11-16 (×7): qty 1

## 2020-11-16 MED ORDER — ONDANSETRON HCL 4 MG PO TABS: 4.0000 mg | ORAL_TABLET | Freq: Four times a day (QID) | ORAL | Status: DC | PRN

## 2020-11-16 MED ORDER — SODIUM CHLORIDE 0.9 % IV SOLN
INTRAVENOUS | Status: DC
  Filled 2020-11-16 (×11): qty 1000

## 2020-11-16 MED ORDER — ESCITALOPRAM OXALATE 10 MG PO TABS
20.0000 mg | ORAL_TABLET | Freq: Every day | ORAL | Status: DC
  Administered 2020-11-17 – 2020-11-22 (×6): 20 mg via ORAL
  Filled 2020-11-16 (×6): qty 2

## 2020-11-16 MED ORDER — SODIUM CHLORIDE 0.9 % IV SOLN: INTRAVENOUS | Status: DC

## 2020-11-16 MED ORDER — ACETAMINOPHEN 325 MG PO TABS
650.0000 mg | ORAL_TABLET | ORAL | Status: DC | PRN
  Administered 2020-11-18 – 2020-11-20 (×4): 650 mg via ORAL
  Filled 2020-11-16 (×4): qty 2

## 2020-11-16 MED ORDER — NICARDIPINE HCL IN NACL 20-0.86 MG/200ML-% IV SOLN
3.0000 mg/h | INTRAVENOUS | Status: DC
  Filled 2020-11-16: qty 200

## 2020-11-16 MED ORDER — NICARDIPINE HCL IN NACL 20-0.86 MG/200ML-% IV SOLN
3.0000 mg/h | INTRAVENOUS | Status: DC
  Administered 2020-11-16 (×3): 10 mg/h via INTRAVENOUS
  Administered 2020-11-16: 5 mg/h via INTRAVENOUS
  Administered 2020-11-16: 15 mg/h via INTRAVENOUS
  Filled 2020-11-16 (×6): qty 200

## 2020-11-16 MED ORDER — OXYBUTYNIN CHLORIDE ER 10 MG PO TB24
10.0000 mg | ORAL_TABLET | Freq: Every day | ORAL | Status: DC
  Administered 2020-11-17 – 2020-11-21 (×5): 10 mg via ORAL
  Filled 2020-11-16 (×7): qty 1

## 2020-11-16 MED ORDER — BUPROPION HCL ER (XL) 150 MG PO TB24
150.0000 mg | ORAL_TABLET | Freq: Every day | ORAL | Status: DC
  Administered 2020-11-17 – 2020-11-22 (×6): 150 mg via ORAL
  Filled 2020-11-16 (×6): qty 1

## 2020-11-16 MED ORDER — DONEPEZIL HCL 10 MG PO TABS
10.0000 mg | ORAL_TABLET | Freq: Every morning | ORAL | Status: DC
  Administered 2020-11-17 – 2020-11-22 (×6): 10 mg via ORAL
  Filled 2020-11-16 (×5): qty 1

## 2020-11-16 MED ORDER — ONDANSETRON HCL 4 MG/2ML IJ SOLN: 4.0000 mg | Freq: Four times a day (QID) | INTRAMUSCULAR | Status: DC | PRN

## 2020-11-16 MED ORDER — HYDROCHLOROTHIAZIDE 25 MG PO TABS
25.0000 mg | ORAL_TABLET | Freq: Every day | ORAL | Status: DC
  Administered 2020-11-17 – 2020-11-22 (×6): 25 mg via ORAL
  Filled 2020-11-16 (×6): qty 1

## 2020-11-16 NOTE — ED Notes (Signed)
Burgess Amor PA in to see pt prior to triage complete.
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
## 2020-11-16 NOTE — ED Provider Notes (Signed)
Medical screening examination/treatment/procedure(s) were conducted as a shared visit with non-physician practitioner(s) and myself.  I personally evaluated the patient during the encounter.  Clinical Impression:   Final diagnoses:  Subarachnoid hemorrhage (HCC)  Traumatic hemorrhage of right cerebrum without loss of consciousness, initial encounter [facility])    This patient is an ill-appearing 75 year old female who had an unwitnessed fall at the nursing facility this morning, she was found on the ground with a hematoma on the side of her head.  The patient is not moving the left side of her body very well, this is a new finding since she first arrived when she was moving both sides.  On exam the patient is nontalkative, she is able to use her right arm and her right leg, she has normal heart and lung sounds, she has a soft nontender abdomen, her pupils are equal round and reactive.  She does have a large hematoma to the right temporal region.  On exam patient is concerning neurologic findings thus a CT scan was ordered of the head and cervical spine.  X-rays show that the patient does not fact have subarachnoid hemorrhage consistent with a traumatic subarachnoid as well as an intraparenchymal hemorrhage in the right frontotemporal region.  This is certainly in part explain the patient's exam.  I discussed the case with the trauma surgeon at [facility] Dr. Sheliah Hatch, he is in the operating room but was able to hear the conversation and requested that the patient be transferred ER to ER.  We will work on this immediately.  This patient is critically ill.  She is not anticoagulated, labs have been drawn, vital signs checked, she has a blood pressure currently of 165.  I suspect this is all traumatic but in case this was intraparenchymal leading to the fall we will add Cardene drip.  Keep blood pressure under 140 systolic.  .Critical Care Performed by: Eber Hong, MD Authorized by: Eber Hong, MD   Critical care provider statement:    Critical care time (minutes):  35   Critical care time was exclusive of:  Separately billable procedures and treating other patients and teaching time   Critical care was necessary to treat or prevent imminent or life-threatening deterioration of the following conditions:  CNS failure or compromise and trauma   Critical care was time spent personally by me on the following activities:  Blood draw for specimens, development of treatment plan with patient or surrogate, discussions with consultants, evaluation of patient's response to treatment, examination of patient, obtaining history from patient or surrogate, ordering and performing treatments and interventions, ordering and review of laboratory studies, ordering and review of radiographic studies, pulse oximetry, re-evaluation of patient's condition and review of old charts      Eber Hong, MD 11/17/20 1127

## 2020-11-16 NOTE — ED Triage Notes (Signed)
Pt presents to ED via [facility] from [facility] for unwitnessed fall, unsure LOC. Pt was found in doorway. Pt c/o right sided head pain.

## 2020-11-16 NOTE — Progress Notes (Signed)
Pt arrived to 4NP10 from ED after being sent from [facility]. Pt wearing upper dentures. Daughter, [name], at bedside. Pt is alert but no vocal, only saying "yes" intermittently. No signs of pain. Pt placed in mitts and PIV wrapped. Pt arrived to unit on Cardene drip and was placed on tele.   Robina Ade, RN

## 2020-11-16 NOTE — H&P (Signed)
Subjective: Patient is a 75 y.o. female admitted for [facility]. Onset of symptoms was a few hours ago, stable since that time.  The pain is rated unknown, and is located at an unknown loacation. She fell earlier today at the [facility]. She has late stage dementia. She has had multiple surgeries by Jordan Likes and Channing Mutters. She is fairly non-verbal today, but winces, says the word "pain," and recognizes her daughter. She is transferred form [place].  Past Medical History:  Diagnosis Date  . Ankle fracture, left 06/08/2014  . Anxiety   . Asthma   . Colon polyp   . COPD (chronic obstructive pulmonary disease) (HCC)   . Dementia (HCC)   . Depression   . Diverticular disease   . Hyperlipidemia   . Hypertension   . IFG (impaired fasting glucose)   . Lumbar burst fracture (HCC) 06/14/2014  . Memory loss   . Mild sleep apnea   . Multiple fractures of left foot 06/08/2014    Past Surgical History:  Procedure Laterality Date  . ABDOMINAL HYSTERECTOMY    . ABDOMINAL SURGERY    . ANTERIOR LAT LUMBAR FUSION Left 06/22/2014   Procedure: Left Lumbar one Corpectomy with Fusion Thoracic twelve-Lumbar two,lateral plating,posterior percutaneous pedicle screws;  Surgeon: Temple Pacini, MD;  Location: [facility];  Service: Neurosurgery;  Laterality: Left;  . APPENDECTOMY    . BACK SURGERY    . CERVICAL FUSION  October 2007  . LUMBAR PERCUTANEOUS PEDICLE SCREW 1 LEVEL N/A 06/22/2014   Procedure: LUMBAR PERCUTANEOUS PEDICLE SCREW 1 LEVEL;  Surgeon: Temple Pacini, MD;  Location: [facility];  Service: Neurosurgery;  Laterality: N/A;  . NECK SURGERY    . ORIF TIBIA FRACTURE Left 06/04/2014   Procedure: OPEN REDUCTION INTERNAL FIXATION (ORIF) PILON FRACTURE AND OPEN REDUCTION INTERNAL FIXATION (ORIF) OF SYNDESMOTIC FRACTURE;  Surgeon: Eldred Manges, MD;  Location: [facility] OR;  Service: Orthopedics;  Laterality: Left;  Big C-arm, Flat Jackson, Biomet Anterolateral plates, cancellous bone graft 20cc. Surgeon will call. Available after 5pm   . ORIF TIBIA FRACTURE Left 06/14/2014   DR Gerlene Fee  . SIGMOIDECTOMY    . TONSILLECTOMY      Prior to Admission medications   Medication Sig Start Date End Date Taking? Authorizing Provider  acetaminophen (TYLENOL) 500 MG tablet Take 1 tablet (500 mg total) by mouth every 6 (six) hours as needed for mild pain or fever. For thoracic fracture. 08/29/20  Yes [name], [name], DO  albuterol (PROAIR HFA) 108 (90 Base) MCG/ACT inhaler Inhale 2 puffs into the lungs every 6 (six) hours as needed. for wheezing 04/25/19  Yes Merlyn Albert, MD  buPROPion (WELLBUTRIN XL) 150 MG 24 hr tablet Take one tablet po daily Patient taking differently: Take 150 mg by mouth daily. 04/30/20  Yes Babs Sciara, MD  diltiazem (CARDIZEM CD) 300 MG 24 hr capsule Take 1 capsule (300 mg total) by mouth daily. 08/22/20  Yes Taylor, [name], DO  donepezil (ARICEPT) 10 MG tablet TAKE (1) TABLET BY MOUTH EACH MORNING. Patient taking differently: Take 10 mg by mouth in the morning. 04/30/20  Yes Babs Sciara, MD  doxazosin (CARDURA) 2 MG tablet TAKE (1) TABLET BY MOUTH ONCE DAILY. Patient taking differently: Take 2 mg by mouth daily. 04/30/20  Yes Luking, Jonna Coup, MD  escitalopram (LEXAPRO) 20 MG tablet TAKE (1) TABLET BY MOUTH ONCE DAILY. Patient taking differently: Take 20 mg by mouth daily. 04/30/20  Yes Babs Sciara, MD  hydrochlorothiazide (HYDRODIURIL) 25 MG tablet TAKE (1) TABLET BY MOUTH ONCE DAILY. Patient taking differently: Take 25 mg by mouth daily. 04/30/20  Yes Babs Sciara, MD  meclizine (ANTIVERT) 25 MG tablet Take one tablet tid prn dizziness Patient taking differently: Take 25 mg by mouth 3 (three) times daily as needed for dizziness. 03/08/19  Yes Merlyn Albert, MD  meloxicam (MOBIC) 7.5 MG tablet TAKE (1) TABLET BY MOUTH ONCE DAILY. Patient taking differently: Take 7.5 mg by mouth daily. 11/28/19  Yes Merlyn Albert, MD  oxybutynin (DITROPAN-XL) 10 MG 24 hr tablet TAKE (1) TABLET BY MOUTH AT  BEDTIME Patient taking differently: Take 10 mg by mouth at bedtime. TAKE (1) TABLET BY MOUTH AT BEDTIME 04/30/20  Yes Babs Sciara, MD   Allergies  Allergen Reactions  . Iohexol Anaphylaxis and Shortness Of Breath    PT STATES SHE WENT INTO ANAPHYLACTIC SHOCK 74YRS AGO AFTER CT WITH CONTRAST AT [place]:Onset Date: 10/10/2009   . Shellfish Allergy Anaphylaxis  . Morphine And Related Other (See Comments)    Unknown   . Penicillins Rash    Can tolerate cephalosporins    Social History   Tobacco Use  . Smoking status: Former Smoker    Packs/day: 0.50    Years: 32.00    Pack years: 16.00    Types: Cigarettes    Start date: 04/11/1962    Quit date: 03/10/1994    Years since quitting: 26.7  . Smokeless tobacco: Never Used  Substance Use Topics  . Alcohol use: No    Alcohol/week: 0.0 standard drinks    Family History  Problem Relation Age of Onset  . Hypertension Father   . Diabetes Father   . Heart attack Father      Review of Systems  Positive ROS: unable to obtain  All other systems have been reviewed and were otherwise negative with the exception of those mentioned in the HPI and as above.  Objective: Vital signs in last 24 hours: Temp:  [97.4 F (36.3 C)-97.5 F (36.4 C)] 97.4 F (36.3 C) (04/02 1204) Pulse Rate:  [51-112] 63 (04/02 1315) Resp:  [13-31] 18 (04/02 1315) BP: (129-200)/(54-118) 141/61 (04/02 1315) SpO2:  [94 %-100 %] 94 % (04/02 1315) Weight:  [54.4 kg] 54.4 kg (04/02 0939)  General Appearance: arousable Head: Normocephalic, R frontal eccynmosis Eyes: PERRL, conjunctiva/corneas clear, gaze conj   Neck: Supple, symmetrical, trachea midline Back: Symmetric, no curvature, ROM normal, no CVA tenderness Lungs:  respirations unlabored Heart: Regular rate and rhythm Abdomen: Soft, non-tender Extremities: Extremities normal, atraumatic, no cyanosis or edema Pulses: 2+ and symmetric all extremities Skin: Skin color, texture, turgor normal, no  rashes or lesions  NEUROLOGIC:   Mental status: Alert, yawns, unable to test memory and attention span and knowledge Motor Exam - grossly normal, moving all  ext Sensory Exam - unable to test Reflexes: decr Coordination - grossly normal Gait - unable to test Balance - unable to test Cranial Nerves: I: smell Not tested  II: visual acuity  OS:   OD:  II: visual fields   II: pupils Equal, round, reactive to light  III,VII: ptosis None  III,IV,VI: extraocular muscles  Full ROM  V: mastication    V: facial light touch sensation    V,VII: corneal reflex    VII: facial muscle function - upper    VII: facial muscle function - lower   VIII: hearing   IX: soft palate elevation    IX,X: gag reflex  XI: trapezius strength    XI: sternocleidomastoid strength   XI: neck flexion strength    XII: tongue strength      Data Review Lab Results  Component Value Date   WBC 6.7 11/16/2020   HGB 13.9 11/16/2020   HCT 43.1 11/16/2020   MCV 90.2 11/16/2020   PLT 401 (H) 11/16/2020   Lab Results  Component Value Date   NA 142 11/16/2020   K 3.7 11/16/2020   CL 105 11/16/2020   CO2 28 11/16/2020   BUN 15 11/16/2020   CREATININE 0.45 11/16/2020   GLUCOSE 100 (H) 11/16/2020   Lab Results  Component Value Date   INR 1.1 11/16/2020    Assessment/Plan:  Estimated body mass index is 23.44 kg/m as calculated from the following:   Height as of this encounter: 5' (1.524 m).   Weight as of this encounter: 54.4 kg. Patient admitted for [facility]. Had long talk with daughter re: prognosis, supportive care     Tia Alert 11/16/2020 1:40 PM

## 2020-11-16 NOTE — ED Triage Notes (Signed)
Pt transfer from [facility] via EMS. Pt had unwitnessed fall at [facility]. Reported baseline is intermittent confusion, ambulates with [facility]. GCS was 12, pt will follow commands and answers questions partially, ie first name only, birth month only- per Waynetta Sandy, Charity fundraiser from [employer]. Pt has right subarachnoid hematoma. Pt is full code. Dr Anitra Lauth is accepting MD from [facility]- is at bedside upon arrival to ED.

## 2020-11-16 NOTE — ED Provider Notes (Addendum)
Patient arrived after being transferred from [facility] with a multifocal subarachnoid hemorrhage with parenchymal contusion.  Spoke with neurosurgery who will evaluate the images.  Also spoke with trauma surgery as they were notified prior to the transfer.  Patient is still awake and alert.  She is noted to be moving all 4 extremities.  Will answer the question of what her name is but otherwise is not talkative.  Pupils are reactive at this time.  She is not on anticoagulation.  Trauma evaluated the pt and does not have any other input.  Pt has 1 system head trauma without other issues at this time.  Dr. Yetta Barre with [facility] to see the pt.  Gwyneth Sprout, MD 11/16/20 1230    Gwyneth Sprout, MD 11/16/20 1345

## 2020-11-17 ENCOUNTER — Inpatient Hospital Stay (HOSPITAL_COMMUNITY): Payer: Medicare Other

## 2020-11-17 MED ORDER — HYDRALAZINE HCL 20 MG/ML IJ SOLN
10.0000 mg | INTRAMUSCULAR | Status: DC | PRN
  Administered 2020-11-17 – 2020-11-21 (×10): 10 mg via INTRAVENOUS
  Filled 2020-11-17 (×10): qty 1

## 2020-11-17 MED ORDER — LABETALOL HCL 5 MG/ML IV SOLN
10.0000 mg | INTRAVENOUS | Status: DC | PRN
  Administered 2020-11-17 – 2020-11-18 (×2): 20 mg via INTRAVENOUS
  Administered 2020-11-18 – 2020-11-19 (×4): 10 mg via INTRAVENOUS
  Administered 2020-11-19: 20 mg via INTRAVENOUS
  Administered 2020-11-20 – 2020-11-22 (×6): 10 mg via INTRAVENOUS
  Filled 2020-11-17 (×11): qty 4

## 2020-11-17 MED ORDER — HYDRALAZINE HCL 20 MG/ML IJ SOLN
5.0000 mg | Freq: Four times a day (QID) | INTRAMUSCULAR | Status: DC | PRN
  Administered 2020-11-17: 5 mg via INTRAVENOUS
  Filled 2020-11-17: qty 1

## 2020-11-17 NOTE — Progress Notes (Signed)
Subjective: The patient is somnolent but arousable.  I spoke with her nurse who states she has not noticed much difference in her clinical status throughout the day.  I got a call from Dr. Alfredo Batty regarding a worsening of the patient's head CT.  Objective: Vital signs in last 24 hours: Temp:  [97.7 F (36.5 C)-99 F (37.2 C)] 98.4 F (36.9 C) (04/03 1533) Pulse Rate:  [46-100] 62 (04/03 2000) Resp:  [16-21] 19 (04/03 2000) BP: (137-210)/(58-85) 181/78 (04/03 2000) SpO2:  [90 %-97 %] 97 % (04/03 1533) Estimated body mass index is 23.44 kg/m as calculated from the following:   Height as of this encounter: 5' (1.524 m).   Weight as of this encounter: 54.4 kg.   Intake/Output from previous day: 04/02 0701 - 04/03 0700 In: 331.7 [I.V.:331.7] Out: 400 [Urine:400] Intake/Output this shift: No intake/output data recorded.  Physical exam the patient is somnolent but arousable.  She will say "ouch" when I pinch her.  She will squeeze with her right hand.  She is left hemiparetic.  Her pupils are equal.  I have reviewed the patient's head CT performed today and compared with her scan yesterday.  Her bilateral posterior frontal hemorrhages are larger, particular on the right.  There is some compression of the corpus callosum.  She has significant brain atrophy.  Lab Results: Recent Labs    11/16/20 1040  WBC 6.7  HGB 13.9  HCT 43.1  PLT 401*   BMET Recent Labs    11/16/20 1040  NA 142  K 3.7  CL 105  CO2 28  GLUCOSE 100*  BUN 15  CREATININE 0.45  CALCIUM 8.7*    Studies/Results: CT HEAD WO CONTRAST  Result Date: 11/17/2020 CLINICAL DATA:  Cerebral hemorrhage. EXAM: CT HEAD WITHOUT CONTRAST TECHNIQUE: Contiguous axial images were obtained from the base of the skull through the vertex without intravenous contrast. COMPARISON:  CT head without contrast 11/16/2020 FINDINGS: Brain: Multifocal hemorrhage again noted. The right frontal hemorrhage has increased in size, most  evident on the coronal images. Hemorrhage now measures 5.3 x 3.7 by 5.5 cm. Maximal previous measurement was 3.1 cm. Extra-axial hemorrhage over the left frontal convexity is stable. Scattered foci of subarachnoid hemorrhage bilaterally is otherwise unchanged. There is increased mass effect with the increasing hemorrhage. Subfalcine herniation is now present 3 mm of midline shift. Increased extra-axial fluid is present over the left convexity. This may be secondary to the mass effect. Brainstem and cerebellum is unremarkable. Vascular: Vascular calcifications are present. No hyperdense vessel is evident. Skull: Calvarium is intact. No focal lytic or blastic lesions are present. No significant extracranial soft tissue lesion is present. Sinuses/Orbits: Fluid level present in the left maxillary sinus. Minimal mucosal thickening is present right maxillary sinus. Scattered ethmoid mucosal thickening is present. No additional fluid levels are present. Mastoid air cells are clear. The globes and orbits are within normal limits. IMPRESSION: 1. Increased size of right frontal hemorrhage, now measuring 5.3 x 3.7 x 5.5 cm. 2. Increased mass effect with 3 mm of midline shift. 3. Increased extra-axial fluid over the left convexity may be secondary to the mass effect. 4. Stable appearance of scattered foci of subarachnoid hemorrhage bilaterally. The above was relayed via text pager to Dr. Lovell Sheehan on 11/17/2020 at 18:40 . Electronically Signed   By: Marin Roberts M.D.   On: 11/17/2020 18:41   CT Head Wo Contrast  Result Date: 11/16/2020 CLINICAL DATA:  Unwitnessed fall EXAM: CT HEAD WITHOUT CONTRAST CT CERVICAL  SPINE WITHOUT CONTRAST TECHNIQUE: Multidetector CT imaging of the head and cervical spine was performed following the standard protocol without intravenous contrast. Multiplanar CT image reconstructions of the cervical spine were also generated. COMPARISON:  08/05/2020 FINDINGS: CT HEAD FINDINGS Brain: Patchy  subarachnoid hemorrhage along the bilateral cerebral convexities. Parenchymal hemorrhage in the anterior and superior right frontal lobe measuring up to 3 cm. A rim of edema is seen around this parenchymal hemorrhage. Cerebral atrophy which is generalized. No visible infarct. No hydrocephalus. Vascular: Atheromatous calcification Skull: Right-sided scalp swelling without calvarial fracture. Sinuses/Orbits: Endoscopic sinus surgery. No active sinusitis. No visible injury. CT CERVICAL SPINE FINDINGS Alignment: No traumatic malalignment Skull base and vertebrae: No acute fracture. C3-4, C4-5, and C5-6 ACDF with solid arthrodesis. Soft tissues and spinal canal: No prevertebral fluid or swelling. No visible canal hematoma. Prominent atheromatous calcification. Disc levels: Postoperative levels described above. Notable C2-3 facet arthritis with anterolisthesis. Upper chest: No visible injury Critical Value/emergent results were called by telephone at the time of interpretation on 11/16/2020 at 10:31 am to Dr Manus Gunning , who verbally acknowledged these results. IMPRESSION: 1. Multifocal subarachnoid hemorrhage correlating with trauma history. 2. ~3 cm superior right frontal parenchymal hemorrhage, presumed hemorrhagic contusion in this setting. 3. Age advanced brain atrophy. 4. Negative for cervical spine fracture. Electronically Signed   By: Marnee Spring M.D.   On: 11/16/2020 10:34   CT Cervical Spine Wo Contrast  Result Date: 11/16/2020 CLINICAL DATA:  Unwitnessed fall EXAM: CT HEAD WITHOUT CONTRAST CT CERVICAL SPINE WITHOUT CONTRAST TECHNIQUE: Multidetector CT imaging of the head and cervical spine was performed following the standard protocol without intravenous contrast. Multiplanar CT image reconstructions of the cervical spine were also generated. COMPARISON:  08/05/2020 FINDINGS: CT HEAD FINDINGS Brain: Patchy subarachnoid hemorrhage along the bilateral cerebral convexities. Parenchymal hemorrhage in the anterior  and superior right frontal lobe measuring up to 3 cm. A rim of edema is seen around this parenchymal hemorrhage. Cerebral atrophy which is generalized. No visible infarct. No hydrocephalus. Vascular: Atheromatous calcification Skull: Right-sided scalp swelling without calvarial fracture. Sinuses/Orbits: Endoscopic sinus surgery. No active sinusitis. No visible injury. CT CERVICAL SPINE FINDINGS Alignment: No traumatic malalignment Skull base and vertebrae: No acute fracture. C3-4, C4-5, and C5-6 ACDF with solid arthrodesis. Soft tissues and spinal canal: No prevertebral fluid or swelling. No visible canal hematoma. Prominent atheromatous calcification. Disc levels: Postoperative levels described above. Notable C2-3 facet arthritis with anterolisthesis. Upper chest: No visible injury Critical Value/emergent results were called by telephone at the time of interpretation on 11/16/2020 at 10:31 am to Dr Manus Gunning , who verbally acknowledged these results. IMPRESSION: 1. Multifocal subarachnoid hemorrhage correlating with trauma history. 2. ~3 cm superior right frontal parenchymal hemorrhage, presumed hemorrhagic contusion in this setting. 3. Age advanced brain atrophy. 4. Negative for cervical spine fracture. Electronically Signed   By: Marnee Spring M.D.   On: 11/16/2020 10:34   DG Hips Bilat W or Wo Pelvis 3-4 Views  Result Date: 11/16/2020 CLINICAL DATA:  75 year old female status post unwitnessed fall EXAM: DG HIP (WITH OR WITHOUT PELVIS) 3-4V BILAT COMPARISON:  None. FINDINGS: The bones appear diffusely demineralized. Lack of density can make detection of nondisplaced fractures difficult. Within these limitations, no convincing evidence of acute fracture or malalignment. Large rectal stool burden concerning for constipation/impaction. Evidence of prior L4-L5 and L5-S1 posterior decompression, discectomy and interbody cage placement. No lytic or blastic osseous lesion. IMPRESSION: 1. No acute fracture or  malalignment. 2. The bones are diffusely demineralized suggesting underlying  osteoporosis. This limits the sensitivity for detection of nondisplaced fractures by conventional x-ray. 3. Moderate amount of formed stool within the rectum concerning for constipation/impaction. Electronically Signed   By: Malachy Moan M.D.   On: 11/16/2020 10:23    Assessment/Plan: Intracerebral hemorrhages: The patient's follow-up scan is worse.  I do not know what her baseline status is but it seems she is not far from what Dr. Yetta Barre described this morning.  I do not think she needs surgery presently, and furthermore, she does not seem like an appropriate surgical candidate.  We will plan to continue observation and plan to repeat her CAT scan tomorrow.  LOS: 1 day     Cristi Loron 11/17/2020, 8:32 PM

## 2020-11-17 NOTE — Progress Notes (Signed)
Pt's blood pressure 193/75 and has no PRN bp medications. Pt also is less alert than yesterday, no vocalization this am, and requires repeated verbalization from RN to open eyes and is not following commands. Message left with receptionist at [facility] about this. Verlin Dike, NP called back and said that she would be rounding soon.  4585: order placed for head CT.  1006: Pt bp 200/77. This RN called Verlin Dike, NP, to request bp medication. See new orders.   Robina Ade, RN

## 2020-11-17 NOTE — Progress Notes (Signed)
Subjective: Patient states some words, arouses easily, FC, MAEx4, overall stable from admission  Objective: Vital signs in last 24 hours: Temp:  [97.4 F (36.3 C)-99 F (37.2 C)] 97.7 F (36.5 C) (04/03 0749) Pulse Rate:  [51-112] 59 (04/03 0900) Resp:  [13-31] 16 (04/03 0900) BP: (117-200)/(48-118) 184/66 (04/03 0900) SpO2:  [90 %-100 %] 96 % (04/03 0749)  Intake/Output from previous day: 04/02 0701 - 04/03 0700 In: 331.7 [I.V.:331.7] Out: 400 [Urine:400] Intake/Output this shift: No intake/output data recorded.  arouses easily, belly soft, MAEx4, opens eyes to voice  Lab Results: Lab Results  Component Value Date   WBC 6.7 11/16/2020   HGB 13.9 11/16/2020   HCT 43.1 11/16/2020   MCV 90.2 11/16/2020   PLT 401 (H) 11/16/2020   Lab Results  Component Value Date   INR 1.1 11/16/2020   BMET Lab Results  Component Value Date   NA 142 11/16/2020   K 3.7 11/16/2020   CL 105 11/16/2020   CO2 28 11/16/2020   GLUCOSE 100 (H) 11/16/2020   BUN 15 11/16/2020   CREATININE 0.45 11/16/2020   CALCIUM 8.7 (L) 11/16/2020    Studies/Results: CT Head Wo Contrast  Result Date: 11/16/2020 CLINICAL DATA:  Unwitnessed fall EXAM: CT HEAD WITHOUT CONTRAST CT CERVICAL SPINE WITHOUT CONTRAST TECHNIQUE: Multidetector CT imaging of the head and cervical spine was performed following the standard protocol without intravenous contrast. Multiplanar CT image reconstructions of the cervical spine were also generated. COMPARISON:  08/05/2020 FINDINGS: CT HEAD FINDINGS Brain: Patchy subarachnoid hemorrhage along the bilateral cerebral convexities. Parenchymal hemorrhage in the anterior and superior right frontal lobe measuring up to 3 cm. A rim of edema is seen around this parenchymal hemorrhage. Cerebral atrophy which is generalized. No visible infarct. No hydrocephalus. Vascular: Atheromatous calcification Skull: Right-sided scalp swelling without calvarial fracture. Sinuses/Orbits: Endoscopic sinus  surgery. No active sinusitis. No visible injury. CT CERVICAL SPINE FINDINGS Alignment: No traumatic malalignment Skull base and vertebrae: No acute fracture. C3-4, C4-5, and C5-6 ACDF with solid arthrodesis. Soft tissues and spinal canal: No prevertebral fluid or swelling. No visible canal hematoma. Prominent atheromatous calcification. Disc levels: Postoperative levels described above. Notable C2-3 facet arthritis with anterolisthesis. Upper chest: No visible injury Critical Value/emergent results were called by telephone at the time of interpretation on 11/16/2020 at 10:31 am to Dr Manus Gunning , who verbally acknowledged these results. IMPRESSION: 1. Multifocal subarachnoid hemorrhage correlating with trauma history. 2. ~3 cm superior right frontal parenchymal hemorrhage, presumed hemorrhagic contusion in this setting. 3. Age advanced brain atrophy. 4. Negative for cervical spine fracture. Electronically Signed   By: Marnee Spring M.D.   On: 11/16/2020 10:34   CT Cervical Spine Wo Contrast  Result Date: 11/16/2020 CLINICAL DATA:  Unwitnessed fall EXAM: CT HEAD WITHOUT CONTRAST CT CERVICAL SPINE WITHOUT CONTRAST TECHNIQUE: Multidetector CT imaging of the head and cervical spine was performed following the standard protocol without intravenous contrast. Multiplanar CT image reconstructions of the cervical spine were also generated. COMPARISON:  08/05/2020 FINDINGS: CT HEAD FINDINGS Brain: Patchy subarachnoid hemorrhage along the bilateral cerebral convexities. Parenchymal hemorrhage in the anterior and superior right frontal lobe measuring up to 3 cm. A rim of edema is seen around this parenchymal hemorrhage. Cerebral atrophy which is generalized. No visible infarct. No hydrocephalus. Vascular: Atheromatous calcification Skull: Right-sided scalp swelling without calvarial fracture. Sinuses/Orbits: Endoscopic sinus surgery. No active sinusitis. No visible injury. CT CERVICAL SPINE FINDINGS Alignment: No traumatic  malalignment Skull base and vertebrae: No acute fracture. C3-4, C4-5,  and C5-6 ACDF with solid arthrodesis. Soft tissues and spinal canal: No prevertebral fluid or swelling. No visible canal hematoma. Prominent atheromatous calcification. Disc levels: Postoperative levels described above. Notable C2-3 facet arthritis with anterolisthesis. Upper chest: No visible injury Critical Value/emergent results were called by telephone at the time of interpretation on 11/16/2020 at 10:31 am to Dr Manus Gunning , who verbally acknowledged these results. IMPRESSION: 1. Multifocal subarachnoid hemorrhage correlating with trauma history. 2. ~3 cm superior right frontal parenchymal hemorrhage, presumed hemorrhagic contusion in this setting. 3. Age advanced brain atrophy. 4. Negative for cervical spine fracture. Electronically Signed   By: Marnee Spring M.D.   On: 11/16/2020 10:34   DG Hips Bilat W or Wo Pelvis 3-4 Views  Result Date: 11/16/2020 CLINICAL DATA:  75 year old female status post unwitnessed fall EXAM: DG HIP (WITH OR WITHOUT PELVIS) 3-4V BILAT COMPARISON:  None. FINDINGS: The bones appear diffusely demineralized. Lack of density can make detection of nondisplaced fractures difficult. Within these limitations, no convincing evidence of acute fracture or malalignment. Large rectal stool burden concerning for constipation/impaction. Evidence of prior L4-L5 and L5-S1 posterior decompression, discectomy and interbody cage placement. No lytic or blastic osseous lesion. IMPRESSION: 1. No acute fracture or malalignment. 2. The bones are diffusely demineralized suggesting underlying osteoporosis. This limits the sensitivity for detection of nondisplaced fractures by conventional x-ray. 3. Moderate amount of formed stool within the rectum concerning for constipation/impaction. Electronically Signed   By: Malachy Moan M.D.   On: 11/16/2020 10:23    Assessment/Plan: [facility] - repeat CT head  Supportive care  Estimated body  mass index is 23.44 kg/m as calculated from the following:   Height as of this encounter: 5' (1.524 m).   Weight as of this encounter: 54.4 kg.    LOS: 1 day    Tia Alert 11/17/2020, 9:46 AM

## 2020-11-17 NOTE — Evaluation (Addendum)
Occupational Therapy Evaluation Patient Details Name: Nichole [name] MRN: [id number] DOB: 1945/12/15 Today's Date: 11/17/2020    History of Present Illness 75 y.o. female who presented 4/2 after being transferred from [facility] in which pt arrived there following an unwitnessed fall at her SNF. Imaging revealed multifocal subarachnoid hemorrhage and superior R frontal parenchymal hemorrhage. Negative for c-spine fx. PMH: HTN, multiple fxs of L foot/ankle, dementia, lumbar burst fx, HLD, depression, COPD, asthma, and anxiety.   Clinical Impression   PTA, pt was living at [facility] and required assistance of ADLs and mobility with RW. Pt currently requiring Max-Total A for ADLs and bed mobility due to cognitive deficits. Pt presenting with decreased balance, awareness to L, functional use of LUE, and cognition. Pt would benefit from further acute OT to facilitate safe dc. Recommend dc to SNF. for further OT to optimize safety, independence with ADLs, and return to PLOF.     Follow Up Recommendations  SNF    Equipment Recommendations  Other (comment) (Defer to next venue)    Recommendations for Other Services PT consult     Precautions / Restrictions Precautions Precautions: Fall Restrictions Weight Bearing Restrictions: No      Mobility Bed Mobility Overal bed mobility: Needs Assistance Bed Mobility: Supine to Sit;Sit to Supine     Supine to sit: Max assist;+2 for physical assistance Sit to supine: Max assist;+2 for physical assistance   General bed mobility comments: Max A +2 to bringing BLEs towards EOB and elevating trunk. Poro following of commands    Transfers Overall transfer level: Needs assistance Equipment used: 2 person hand held assist Transfers: Sit to/from Stand Sit to Stand: Mod assist;+2 physical assistance         General transfer comment: Mod A to power up and present posterior lean    Balance Overall balance assessment: Needs  assistance Sitting-balance support: No upper extremity supported;Feet supported Sitting balance-Leahy Scale: Poor   Postural control: Posterior lean Standing balance support: Bilateral upper extremity supported;During functional activity Standing balance-Leahy Scale: Poor                             ADL either performed or assessed with clinical judgement   ADL Overall ADL's : Needs assistance/impaired                                       General ADL Comments: Total A for ADLs due to cognition. Pt presenting with poor funcitonal use of LUE, decreased attention to L, and poor balance     Vision   Vision Assessment?: Yes;Vision impaired- to be further tested in functional context Alignment/Gaze Preference: Head turned;Gaze right Additional Comments: Pt able to track her daughter's face with almost midline with Max cues. Will continue to follwo acutely as admitted.     Perception Perception Perception Tested?: Yes Perception Deficits: Inattention/neglect Inattention/Neglect: Does not attend to left visual field;Does not attend to left side of body   Praxis      Pertinent Vitals/Pain Pain Assessment: Faces Faces Pain Scale: Hurts little more Pain Intervention(s): Monitored during session;Repositioned     Hand Dominance Right   Extremity/Trunk Assessment Upper Extremity Assessment Upper Extremity Assessment: LUE deficits/detail LUE Deficits / Details: Keeping her hand closed in fist. Able to perform PROM and extend fingers. Tendency for flexion at fingers and elbow. Able to perform PROM.  Decreased active movement. Edema noted LUE Coordination: decreased fine motor;decreased gross motor   Lower Extremity Assessment Lower Extremity Assessment: Defer to PT evaluation   Cervical / Trunk Assessment Cervical / Trunk Assessment: Kyphotic   Communication Communication Communication: No difficulties   Cognition Arousal/Alertness:  Lethargic;Awake/alert (Initially keeping eye closed; once awake after washing face, pt maintaining eyes open) Behavior During Therapy: Flat affect;Restless Overall Cognitive Status: History of cognitive impairments - at baseline                                 General Comments: Dementia at baseline.   General Comments  Daughter present throughout    Exercises     Shoulder Instructions      Home Living Family/patient expects to be discharged to:: Skilled nursing facility   Available Help at Discharge: Skilled Nursing Facility Type of Home: Skilled Nursing Facility                                  Prior Functioning/Environment Level of Independence: Needs assistance  Gait / Transfers Assistance Needed: Pt needs min guard for ambulating with PT with RW ":the length of the hall?":. Pt with x3 falls since October when walking alone. Pt needing assistance with all functional mobility. Pt can use a w/c independently but can be impulsive to stand up. ADL's / Homemaking Assistance Needed: Staff assist her in ADLs            OT Problem List: Decreased strength;Decreased range of motion;Impaired balance (sitting and/or standing);Decreased activity tolerance;Decreased knowledge of precautions;Decreased safety awareness;Decreased knowledge of use of DME or AE;Decreased cognition;Impaired UE functional use      OT Treatment/Interventions: Self-care/ADL training;Therapeutic exercise;Energy conservation;DME and/or AE instruction;Therapeutic activities;Patient/family education    OT Goals(Current goals can be found in the care plan section) Acute Rehab OT Goals Patient Stated Goal: Unstated; Daughter would like for her to walk again OT Goal Formulation: With family Time For Goal Achievement: 12/01/20 Potential to Achieve Goals: Good  OT Frequency: Min 2X/week   Barriers to D/C:            Co-evaluation PT/OT/SLP Co-Evaluation/Treatment: Yes Reason for  Co-Treatment: To address functional/ADL transfers;For patient/therapist safety PT goals addressed during session: Mobility/safety with mobility;Balance;Proper use of DME OT goals addressed during session: ADL's and self-care      AM-PAC OT "6 Clicks" Daily Activity     Outcome Measure Help from another person eating meals?: A Lot Help from another person taking care of personal grooming?: A Lot Help from another person toileting, which includes using toliet, bedpan, or urinal?: Total Help from another person bathing (including washing, rinsing, drying)?: Total Help from another person to put on and taking off regular upper body clothing?: Total Help from another person to put on and taking off regular lower body clothing?: Total 6 Click Score: 8   End of Session Equipment Utilized During Treatment: Rolling Ruble;Gait belt Nurse Communication: Mobility status  Activity Tolerance: Patient tolerated treatment well Patient left: in bed;with call bell/phone within reach;with bed alarm set;with family/visitor present  OT Visit Diagnosis: Unsteadiness on feet (R26.81);Other abnormalities of gait and mobility (R26.89);Muscle weakness (generalized) (M62.81);Pain                Time: 4235-3614 OT Time Calculation (min): 29 min Charges:  OT General Charges $OT Visit: 1 Visit OT Evaluation $OT Eval  Moderate Complexity: 1 Mod  Yadier Bramhall MSOT, OTR/L Acute Rehab Pager: [phone] Office: [phone]  Theodoro Grist Ulyses Panico 11/17/2020, 4:29 PM
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
## 2020-11-17 NOTE — Evaluation (Signed)
Physical Therapy Evaluation Patient Details Name: Nichole [name] MRN: [id number] DOB: 11/23/1945 Today's Date: 11/17/2020   History of Present Illness  75 y.o. female who presented 4/2 after being transferred from [facility] in which pt arrived there following an unwitnessed fall at her SNF. Imaging revealed multifocal subarachnoid hemorrhage and superior R frontal parenchymal hemorrhage. Negative for c-spine fx. PMH: HTN, multiple fxs of L foot/ankle, dementia, lumbar burst fx, HLD, depression, COPD, asthma, and anxiety.  Clinical Impression  Pt presents with condition above and deficits mentioned below, see PT Problem List. PTA, pt was living at [facility] and required assistance of ADLs and mobility with RW. Pt was able to use her w/c without physical assistance, but was impulsive to get up, likely due to her dementia. Pt with hx of x3 falls recently. Pt currently requiring Max-Total A for ADLs and bed mobility due to cognitive deficits and impaired strength and coordination. Pt presenting with decreased balance, awareness to L, functional use of L limbs, and cognition. Pt would benefit from further acute PT to facilitate safe dc. Recommend dc to SNF for further PT to optimize safety, independence with functional mobility, and return to PLOF. Recommending upon return to SNF they utilize chair and bed alarms to notify staff when pt gets up to reduce risk for further future falls and injuries.    Follow Up Recommendations SNF;Supervision/Assistance - 24 hour    Equipment Recommendations  Other (comment) (defer to next venue)    Recommendations for Other Services OT consult     Precautions / Restrictions Precautions Precautions: Fall Restrictions Weight Bearing Restrictions: No      Mobility  Bed Mobility Overal bed mobility: Needs Assistance Bed Mobility: Supine to Sit;Sit to Supine     Supine to sit: Max assist;+2 for physical assistance Sit to supine: Max assist;+2 for physical  assistance   General bed mobility comments: Max A +2 to bringing BLEs towards EOB and elevating trunk. Poor following of commands    Transfers Overall transfer level: Needs assistance Equipment used: 2 person hand held assist Transfers: Sit to/from Stand Sit to Stand: Mod assist;+2 physical assistance         General transfer comment: Mod A to power up and present posterior lean  Ambulation/Gait Ambulation/Gait assistance: Mod assist;+2 physical assistance;+2 safety/equipment Gait Distance (Feet): 3 Feet Assistive device: Rolling Echeverria (2 wheeled) Gait Pattern/deviations: Shuffle;Decreased stride length;Decreased step length - right;Decreased step length - left;Trunk flexed Gait velocity: reduced Gait velocity interpretation: <1.31 ft/sec, indicative of household ambulator General Gait Details: Manually pushed RW anteriorly to cue pt to lean anteriorly and initiate steps, mod success. ModAx2 for RW management and steadying of pt and to shift weight for leg lift to step posteriorly as needed. Short shuffling, unsteady steps. Pt needing physical assistance to open L hand to place and release RW.  Stairs            Wheelchair Mobility    Modified Rankin (Stroke Patients Only)       Balance Overall balance assessment: Needs assistance Sitting-balance support: No upper extremity supported;Feet supported Sitting balance-Leahy Scale: Poor Sitting balance - Comments: Pt with posterior and L lateral lean needing mod-maxA to prevent. Postural control: Posterior lean Standing balance support: Bilateral upper extremity supported;During functional activity Standing balance-Leahy Scale: Poor Standing balance comment: ModAx2 and bil UE support.  Pertinent Vitals/Pain Pain Assessment: Faces Faces Pain Scale: Hurts little more Pain Intervention(s): Limited activity within patient's tolerance;Monitored during session;Repositioned    Home  Living Family/patient expects to be discharged to:: Skilled nursing facility   Available Help at Discharge: Skilled Nursing Facility Type of Home: Skilled Nursing Facility                Prior Function Level of Independence: Needs assistance   Gait / Transfers Assistance Needed: Pt needs min guard for ambulating with PT with RW "the length of the hall", per daughter. Pt with x3 falls since October when walking alone. Pt needing assistance with all functional mobility. Pt can use a w/c independently but can be impulsive to stand up.  ADL's / Homemaking Assistance Needed: Staff assist her in ADLs        Hand Dominance   Dominant Hand: Right    Extremity/Trunk Assessment   Upper Extremity Assessment Upper Extremity Assessment: Defer to OT evaluation LUE Deficits / Details: Keeping her hand closed in fist. Able to perform PROM and extend fingers. Tendency for flexion at fingers and elbow. Able to perform PROM. Decreased active movement. Edema noted LUE Coordination: decreased fine motor;decreased gross motor    Lower Extremity Assessment Lower Extremity Assessment: LLE deficits/detail LLE Deficits / Details: Tendency to extend knee and with less active movement compared to R; unable to formally test due to impaired cognition and command following    Cervical / Trunk Assessment Cervical / Trunk Assessment: Kyphotic  Communication   Communication: No difficulties  Cognition Arousal/Alertness: Lethargic;Awake/alert (Initially keeping eye closed; once awake after washing face, pt maintaining eyes open) Behavior During Therapy: Flat affect;Restless Overall Cognitive Status: History of cognitive impairments - at baseline                                 General Comments: Dementia at baseline.      General Comments General comments (skin integrity, edema, etc.): Daughter present throughout; Pts' eyes only tracking to midline and holding at midline for < 1 sec  before returns to R, head turned to R throughout    Exercises     Assessment/Plan    PT Assessment Patient needs continued PT services  PT Problem List Decreased strength;Decreased range of motion;Decreased activity tolerance;Decreased balance;Decreased mobility;Decreased coordination;Decreased cognition;Decreased knowledge of use of DME;Decreased safety awareness       PT Treatment Interventions DME instruction;Gait training;Functional mobility training;Therapeutic activities;Therapeutic exercise;Balance training;Neuromuscular re-education;Cognitive remediation;Patient/family education    PT Goals (Current goals can be found in the Care Plan section)  Acute Rehab PT Goals Patient Stated Goal: Unstated; Daughter would like for her to walk again PT Goal Formulation: With patient/family Time For Goal Achievement: 12/01/20 Potential to Achieve Goals: Fair    Frequency Min 3X/week   Barriers to discharge        Co-evaluation PT/OT/SLP Co-Evaluation/Treatment: Yes Reason for Co-Treatment: Necessary to address cognition/behavior during functional activity;For patient/therapist safety;To address functional/ADL transfers PT goals addressed during session: Mobility/safety with mobility;Balance OT goals addressed during session: ADL's and self-care       AM-PAC PT "6 Clicks" Mobility  Outcome Measure Help needed turning from your back to your side while in a flat bed without using bedrails?: A Lot Help needed moving from lying on your back to sitting on the side of a flat bed without using bedrails?: A Lot Help needed moving to and from a bed to a chair (  including a wheelchair)?: A Lot Help needed standing up from a chair using your arms (e.g., wheelchair or bedside chair)?: A Lot Help needed to walk in hospital room?: A Lot Help needed climbing 3-5 steps with a railing? : Total 6 Click Score: 11    End of Session Equipment Utilized During Treatment: Gait belt Activity Tolerance:  Patient limited by lethargy Patient left: in bed;with call bell/phone within reach;with bed alarm set;with family/visitor present Nurse Communication: Mobility status PT Visit Diagnosis: Unsteadiness on feet (R26.81);Other abnormalities of gait and mobility (R26.89);Muscle weakness (generalized) (M62.81);Repeated falls (R29.6);History of falling (Z91.81);Difficulty in walking, not elsewhere classified (R26.2);Other symptoms and signs involving the nervous system (R29.898);Hemiplegia and hemiparesis Hemiplegia - Right/Left: Left Hemiplegia - dominant/non-dominant: Non-dominant Hemiplegia - caused by:  [facility] and superior R frontal parenchymal hemorrhage with fall)    Time: 4854-6270 PT Time Calculation (min) (ACUTE ONLY): 49 min   Charges:   PT Evaluation $PT Eval Moderate Complexity: 1 Mod PT Treatments $Therapeutic Activity: 8-22 mins        Raymond Gurney, PT, DPT Acute Rehabilitation Services  Pager: [phone] Office: [phone]   Jewel Baize 11/17/2020, 5:45 PM

## 2020-11-17 NOTE — Progress Notes (Signed)
PT Cancellation Note  Patient Details Name: Nichole [name] MRN: [id number] DOB: 1946/04/14   Cancelled Treatment:    Reason Eval/Treat Not Completed: Medical issues which prohibited therapy;Patient's level of consciousness. RN reporting pt has significantly high blood pressure this morning with SBP being in the 190s. RN also reporting pt appears to be less verbal and with decreased arousal compared to prior date. RN requesting hold at this time as neuro team should be coming to assess pt this morning. Will follow-up as time permits.   Raymond Gurney, PT, DPT Acute Rehabilitation Services  Pager: [phone] Office: [phone]     Jewel Baize 11/17/2020, 8:54 AM

## 2020-11-17 NOTE — Evaluation (Signed)
Clinical/Bedside Swallow Evaluation Patient Details  Name: Nichole [name] MRN: [id number] Date of Birth: Nov 07, 1945  Today's Date: 11/17/2020 Time: SLP Start Time (ACUTE ONLY): 1053 SLP Stop Time (ACUTE ONLY): 1110 SLP Time Calculation (min) (ACUTE ONLY): 17 min  Past Medical History:  Past Medical History:  Diagnosis Date  . Ankle fracture, left 06/08/2014  . Anxiety   . Asthma   . Colon polyp   . COPD (chronic obstructive pulmonary disease) (HCC)   . Dementia (HCC)   . Depression   . Diverticular disease   . Hyperlipidemia   . Hypertension   . IFG (impaired fasting glucose)   . Lumbar burst fracture (HCC) 06/14/2014  . Memory loss   . Mild sleep apnea   . Multiple fractures of left foot 06/08/2014   Past Surgical History:  Past Surgical History:  Procedure Laterality Date  . ABDOMINAL HYSTERECTOMY    . ABDOMINAL SURGERY    . ANTERIOR LAT LUMBAR FUSION Left 06/22/2014   Procedure: Left Lumbar one Corpectomy with Fusion Thoracic twelve-Lumbar two,lateral plating,posterior percutaneous pedicle screws;  Surgeon: Temple Pacini, MD;  Location: [facility];  Service: Neurosurgery;  Laterality: Left;  . APPENDECTOMY    . BACK SURGERY    . CERVICAL FUSION  October 2007  . LUMBAR PERCUTANEOUS PEDICLE SCREW 1 LEVEL N/A 06/22/2014   Procedure: LUMBAR PERCUTANEOUS PEDICLE SCREW 1 LEVEL;  Surgeon: Temple Pacini, MD;  Location: [facility];  Service: Neurosurgery;  Laterality: N/A;  . NECK SURGERY    . ORIF TIBIA FRACTURE Left 06/04/2014   Procedure: OPEN REDUCTION INTERNAL FIXATION (ORIF) PILON FRACTURE AND OPEN REDUCTION INTERNAL FIXATION (ORIF) OF SYNDESMOTIC FRACTURE;  Surgeon: Eldred Manges, MD;  Location: [facility];  Service: Orthopedics;  Laterality: Left;  Big C-arm, Flat Jackson, Biomet Anterolateral plates, cancellous bone graft 20cc. Surgeon will call. Available after 5pm  . ORIF TIBIA FRACTURE Left 06/14/2014   DR Gerlene Fee  . SIGMOIDECTOMY    . TONSILLECTOMY     HPI:  75 y.o.  female admitted for CHI after fall @ SNF. Medical hx includes late stage dementia and COPD.   Assessment / Plan / Recommendation Clinical Impression  Pt presents with a mild oral dysphagia. Pts daughter at bedside and assisted with hx regarding baseline diet. Pt has been a resident at a SNF since Oct 2021. Per daughter she has a very good appetite and consuming softer solids after losing lower denture plate @ SNF. Upper dentures were in place. Pt unable to follow any simple commands due to advanced dementia but accepted majority of POs when offered.  No overt s/sx of aspiration exhibited with any POs. Pt with inadeqaute mastication of solids, at times expectorating pieces of graham cracker. When small amounts offered, pt exhibited functional mastication and oral clearance. Recommned dysphagia 3 (mechanical soft) and thin liquids with meds in puree. SLP to follow up for diet tolerance.  SLP Visit Diagnosis: Dysphagia, oral phase (R13.11)    Aspiration Risk  Mild aspiration risk    Diet Recommendation   Dysphagia 3 (mechanical soft) thin liquids   Medication Administration: Whole meds with puree    Other  Recommendations Oral Care Recommendations: Oral care BID   Follow up Recommendations Skilled Nursing facility      Frequency and Duration min 1 x/week  1 week       Prognosis Prognosis for Safe Diet Advancement: Fair Barriers to Reach Goals: Cognitive deficits;Severity of deficits      Swallow Study  General Date of Onset: 11/15/20 HPI: 75 y.o. female admitted for CHI after fall @ SNF. Medical hx includes late stage dementia and COPD. Type of Study: Bedside Swallow Evaluation Previous Swallow Assessment: none on file Diet Prior to this Study: NPO Temperature Spikes Noted: No Respiratory Status: Room air History of Recent Intubation: No Behavior/Cognition: Alert;Doesn't follow directions Oral Cavity Assessment: Within Functional Limits Oral Care Completed by SLP: No Oral  Cavity - Dentition: Dentures, top (edentulous lower) Vision: Functional for self-feeding Self-Feeding Abilities: Total assist Patient Positioning: Upright in bed Baseline Vocal Quality: Not observed Volitional Cough: Cognitively unable to elicit Volitional Swallow: Unable to elicit    Oral/Motor/Sensory Function Overall Oral Motor/Sensory Function: Generalized oral weakness (unable to follow commands for formal OME)   Ice Chips Ice chips: Not tested   Thin Liquid Thin Liquid: Within functional limits Presentation: Cup;Straw    Nectar Thick Nectar Thick Liquid: Not tested   Honey Thick Honey Thick Liquid: Not tested   Puree Puree: Within functional limits   Solid     Solid: Impaired Presentation: Spoon Oral Phase Impairments: Reduced labial seal;Impaired mastication (at times expectoration of unmasticated solid) Oral Phase Functional Implications: Impaired mastication;Oral residue Pharyngeal Phase Impairments: Suspected delayed Swallow;Multiple swallows      Ardyth Gal MA, CCC-SLP Acute Rehabilitation Services   11/17/2020,11:39 AM

## 2020-11-18 NOTE — Progress Notes (Signed)
She is clinically stable from last night.  She has late stage dementia and is fairly nonverbal but she opens her eyes to stimulus, she is left hemiparetic but moves her fingers somewhat, she localizes briskly on the right, her daughter states that she is eating well.  Difficult situation.  I have recommended DNR.  I recommended supportive care.  Obviously given her late stage dementia I would not recommend aggressive care such as craniotomy.  Her premorbid status was quite limited and obviously is going to be more limited now.  The daughter is "hoping for a miracle."  Continue supportive care for now.  Repeat CT scan in the next couple days

## 2020-11-18 NOTE — Plan of Care (Signed)

## 2020-11-18 NOTE — Social Work (Signed)
CSW called patient spouse, received no answer. Called patient's daughter, no answer, unabel to leave message.   CSW will continue to follow and assist with discharge planning.  Antony Blackbird, MSW, LCSW Clinical Social Worker

## 2020-11-19 LAB — GLUCOSE, CAPILLARY
Glucose-Capillary: 106 mg/dL — ABNORMAL HIGH (ref 70–99)
Glucose-Capillary: 160 mg/dL — ABNORMAL HIGH (ref 70–99)
Glucose-Capillary: 108 mg/dL — ABNORMAL HIGH (ref 70–99)

## 2020-11-19 NOTE — Progress Notes (Signed)
Overall stable, opens eyes to pain, some verbalization, looks at me, localizes with R arm, dense L hemiparesis. Continue supportive care.

## 2020-11-19 NOTE — TOC CAGE-AID Note (Signed)
Transition of Care [facility]) - CAGE-AID Screening   Patient Details  Name: Nichole [name] MRN: [id number] Date of Birth: 03/28/1946  Clinical Narrative:  Patient admitted for unwitnessed fall. Disoriented X4 at this time and unable to participate in screening.  CAGE-AID Screening: Substance Abuse Screening unable to be completed due to: : Patient unable to participate

## 2020-11-19 NOTE — Progress Notes (Signed)
Physical Therapy Treatment Patient Details Name: Nichole [name] MRN: [id number] DOB: 01/16/1946 Today's Date: 11/19/2020    History of Present Illness 75 y.o. female who presented 4/2 after being transferred from [facility] in which pt arrived there following an unwitnessed fall at her SNF. Imaging revealed multifocal subarachnoid hemorrhage and superior R frontal parenchymal hemorrhage. Negative for c-spine fx. CT head 4/3 showing increased size of right frontal hemorrhage, increased mass effect with 65mm of midline shift and increased extra-axial fluid over the left convexity. PMH: HTN, multiple fxs of L foot/ankle, dementia, lumbar burst fx, HLD, depression, COPD, asthma, and anxiety.    PT Comments    Pt with regression towards her physical therapy goals. Not following commands and requiring totalA + 2 for all aspects of mobility. Unable to stand. Will continue to follow acutely to reassess.    Follow Up Recommendations  SNF;Supervision/Assistance - 24 hour     Equipment Recommendations  Other (comment) (defer to next venue)    Recommendations for Other Services       Precautions / Restrictions Precautions Precautions: Fall Restrictions Weight Bearing Restrictions: No    Mobility  Bed Mobility Overal bed mobility: Needs Assistance Bed Mobility: Supine to Sit;Sit to Supine     Supine to sit: +2 for physical assistance;Total assist Sit to supine: +2 for physical assistance;Total assist   General bed mobility comments: TotalA + 2 for all aspects of bed mobility    Transfers Overall transfer level: Needs assistance Equipment used: None Transfers: Sit to/from Stand Sit to Stand: Total assist         General transfer comment: Able to minimally clear hips from edge of bed with totalA  Ambulation/Gait                 Stairs             Wheelchair Mobility    Modified Rankin (Stroke Patients Only) Modified Rankin (Stroke Patients  Only) Pre-Morbid Rankin Score: Moderately severe disability Modified Rankin: Severe disability     Balance Overall balance assessment: Needs assistance Sitting-balance support: No upper extremity supported;Feet supported Sitting balance-Leahy Scale: Poor Sitting balance - Comments: Pt with poor trunk control with kyphotic posture, requiring min-maxA                                    Cognition Arousal/Alertness: Lethargic Behavior During Therapy: Flat affect Overall Cognitive Status: History of cognitive impairments - at baseline                                 General Comments: Dementia at baseline. Did not follow commands this session      Exercises General Exercises - Upper Extremity Shoulder Flexion: PROM;Both;10 reps;Supine General Exercises - Lower Extremity Ankle Circles/Pumps: PROM;Both;5 reps;Supine Long Arc Quad: PROM;5 reps;Both;Seated Heel Slides: PROM;Both;5 reps;Supine    General Comments        Pertinent Vitals/Pain Pain Assessment: Faces Faces Pain Scale: No hurt    Home Living                      Prior Function            PT Goals (current goals can now be found in the care plan section) Acute Rehab PT Goals Patient Stated Goal: Unable to state PT Goal Formulation: With patient/family  Time For Goal Achievement: 12/01/20 Potential to Achieve Goals: Fair Progress towards PT goals: Not progressing toward goals - comment    Frequency    Min 2X/week      PT Plan Frequency needs to be updated    Co-evaluation              AM-PAC PT "6 Clicks" Mobility   Outcome Measure  Help needed turning from your back to your side while in a flat bed without using bedrails?: Total Help needed moving from lying on your back to sitting on the side of a flat bed without using bedrails?: Total Help needed moving to and from a bed to a chair (including a wheelchair)?: Total Help needed standing up from a chair  using your arms (e.g., wheelchair or bedside chair)?: Total Help needed to walk in hospital room?: Total   6 Click Score: 5    End of Session Equipment Utilized During Treatment: Gait belt Activity Tolerance: Patient limited by lethargy Patient left: in bed;with call bell/phone within reach;with bed alarm set Nurse Communication: Mobility status PT Visit Diagnosis: Unsteadiness on feet (R26.81);Other abnormalities of gait and mobility (R26.89);Muscle weakness (generalized) (M62.81);Repeated falls (R29.6);History of falling (Z91.81);Difficulty in walking, not elsewhere classified (R26.2);Other symptoms and signs involving the nervous system (R29.898);Hemiplegia and hemiparesis Hemiplegia - Right/Left: Left Hemiplegia - dominant/non-dominant: Non-dominant Hemiplegia - caused by:  [facility] and superior R frontal parenchymal hemorrhage with fall)     Time: 1191-4782 PT Time Calculation (min) (ACUTE ONLY): 21 min  Charges:  $Therapeutic Activity: 8-22 mins                     Lillia Pauls, PT, DPT Acute Rehabilitation Services Pager [phone] Office [phone]    Norval Morton 11/19/2020, 2:50 PM

## 2020-11-19 NOTE — Care Management Important Message (Signed)
Important Message  Patient Details  Name: Nichole [name] MRN: [id number] Date of Birth: 1946-07-10   Medicare Important Message Given:  Yes     Petr Bontempo Stefan Church 11/19/2020, 1:44 PM

## 2020-11-20 ENCOUNTER — Inpatient Hospital Stay (HOSPITAL_COMMUNITY): Payer: Medicare Other

## 2020-11-20 NOTE — Progress Notes (Signed)
When nurse was reporting off to oncoming RN noticed patients IV was infiltrated. Will remove and get new access.

## 2020-11-20 NOTE — Progress Notes (Signed)
Patient is overall stable.  She opens her eyes more spontaneously.  Her daughter states that she is saying some words like light and food.  She is densely left hemiparetic.  She moves her right side well.  Sounds like she is eating okay and taking her medications.  Continue therapy.  Will need placement.  Will follow up with new head CT today or tomorrow.  Palliative care asked to consult on the patient to help with management moving forward
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
## 2020-11-21 DIAGNOSIS — I609 Nontraumatic subarachnoid hemorrhage, unspecified: Secondary | ICD-10-CM

## 2020-11-21 DIAGNOSIS — Z7189 Other specified counseling: Secondary | ICD-10-CM

## 2020-11-21 LAB — SARS CORONAVIRUS 2 (TAT 6-24 HRS): SARS Coronavirus 2: NEGATIVE

## 2020-11-21 NOTE — Progress Notes (Signed)
  Speech Language Pathology Treatment: Dysphagia  Patient Details Name: MINTA FAIR MRN: [id number] DOB: [birth year] Today's Date: 11/21/2020 Time: 9449-6759 SLP Time Calculation (min) (ACUTE ONLY): 8 min  Assessment / Plan / Recommendation Clinical Impression  Pt seen with Dys 3 texture/thin liquids for tolerance without family at bedside. Due to dementia she was unable to follow command, kept eyes closed and accepted straw sips thin with adequate swallow/respiratory reciprocity. Upper denture plate donned and pt expectorated piece of graham cracker. Spoke with Hydrologist and pt is able to masticate pot roast and most items on tray and expectorated food higher in texture that she is unable to masticate. Recommend continue Dys 3/thin, full assist with meals and no need for further follow up.   HPI HPI: 75 y.o. female admitted for CHI after fall @ SNF. Medical hx includes late stage dementia and COPD.      SLP Plan  Discharge SLP treatment due to (comment)       Recommendations  Diet recommendations: Dysphagia 3 (mechanical soft);Thin liquid Liquids provided via: Cup;Straw Medication Administration: Crushed with puree Supervision: Full supervision/cueing for compensatory strategies;Staff to assist with self feeding Compensations: Minimize environmental distractions;Slow rate;Small sips/bites;Lingual sweep for clearance of pocketing Postural Changes and/or Swallow Maneuvers: Seated upright 90 degrees                Oral Care Recommendations: Oral care BID Follow up Recommendations: Skilled Nursing facility SLP Visit Diagnosis: Dysphagia, unspecified (R13.10) Plan: Discharge SLP treatment due to (comment)       GO                Royce Macadamia 11/21/2020, 5:06 PM   Breck Coons Lonell Face.Ed Nurse, children's [phone] Office [phone]

## 2020-11-21 NOTE — Progress Notes (Signed)
Subjective: Patient resting comfortably in bed, left hemiplegic  Objective: Vital signs in last 24 hours: Temp:  [97.6 F (36.4 C)-101.4 F (38.6 C)] 97.6 F (36.4 C) (04/07 0800) Pulse Rate:  [55-69] 58 (04/07 0800) Resp:  [14-23] 19 (04/07 0800) BP: (157-182)/(58-77) 166/63 (04/07 0800) SpO2:  [95 %-96 %] 96 % (04/07 0800)  Intake/Output from previous day: 04/06 0701 - 04/07 0700 In: 740 [P.O.:740] Out: 300 [Urine:300] Intake/Output this shift: No intake/output data recorded.    Lab Results: Lab Results  Component Value Date   WBC 6.7 11/16/2020   HGB 13.9 11/16/2020   HCT 43.1 11/16/2020   MCV 90.2 11/16/2020   PLT 401 (H) 11/16/2020   Lab Results  Component Value Date   INR 1.1 11/16/2020   BMET Lab Results  Component Value Date   NA 142 11/16/2020   K 3.7 11/16/2020   CL 105 11/16/2020   CO2 28 11/16/2020   GLUCOSE 100 (H) 11/16/2020   BUN 15 11/16/2020   CREATININE 0.45 11/16/2020   CALCIUM 8.7 (L) 11/16/2020    Studies/Results: CT HEAD WO CONTRAST  Result Date: 11/20/2020 CLINICAL DATA:  Follow-up cerebral hemorrhage. EXAM: CT HEAD WITHOUT CONTRAST TECHNIQUE: Contiguous axial images were obtained from the base of the skull through the vertex without intravenous contrast. COMPARISON:  11/17/2020 FINDINGS: Brain: Multifocal intracranial hemorrhage is again seen including hemorrhagic contusions in the frontal lobes at the vertex. The dominant hemorrhage in the right frontal lobe measures 3.5 x 4.8 x 3.7 cm, mildly decreased in size from the prior study. Mass effect on the body of the right lateral ventricle has mildly decreased. Edema in the high right greater than left frontal lobes has not significantly changed. Scattered small to moderate volume subarachnoid hemorrhage over both cerebral convexities is similar to the prior study. There is new small volume intraventricular hemorrhage in the occipital horns of both lateral ventricles. A low-density subdural  fluid collection over the left cerebral convexity has mildly enlarged and now measures up to 1.0 cm in thickness. There is no significant midline shift. There is mild cerebral atrophy. Patchy hypodensities in the cerebral white matter bilaterally are unchanged and nonspecific but compatible with moderate chronic small vessel ischemic disease. The basilar cisterns are patent. Vascular: Calcified atherosclerosis at the skull base. No hyperdense vessel. Skull: No fracture or suspicious osseous lesion. Sinuses/Orbits: Prior functional endoscopic sinus surgery. Minimal scattered mucosal thickening in the paranasal sinuses. Unchanged small right mastoid effusion. Unremarkable orbits. Other: Mildly decreased size of small right frontal scalp hematoma. IMPRESSION: 1. Multifocal intracranial hemorrhage as detailed above with mildly decreased size of the dominant right frontal hemorrhage. No midline shift. 2. New small volume intraventricular hemorrhage. 3. Mildly increased size of low-density subdural fluid collection over the left cerebral convexity, possibly a traumatic hygroma. 4. Unchanged subarachnoid hemorrhage. Electronically Signed   By: Sebastian Ache M.D.   On: 11/20/2020 14:53    Assessment/Plan: S/p CHI. Patient opens eyes to voice. CT overall stable.  Awaiting SNF placement.    LOS: 5 days    Tiana Loft Giavonni Fonder 11/21/2020, 8:08 AM

## 2020-11-21 NOTE — Plan of Care (Signed)

## 2020-11-21 NOTE — Consult Note (Signed)
Consultation Note Date: 11/21/2020   Patient Name: Nichole [name]  DOB: May 01, 1946  MRN: [id number]  Age / Sex: 75 y.o., female  PCP: [name], DO Referring Physician: Tia Alert, MD  Reason for Consultation: Establishing goals of care  HPI/Patient Profile: 75 y.o. female  with past medical history of HTN, dementia, CoPD, depression, anxiety, asthma admitted on 11/16/2020 after a fall. Workup reveals subarachnoid hemorhage and R frontal parenchymal hemorrhage. Palliative medicine consulted for GOC.   Clinical Assessment and Goals of Care:  I have reviewed medical records including EPIC notes, labs and imaging,  examined the patient and spoke with patient's daughter via phone  to discuss diagnosis prognosis, GOC, EOL wishes, disposition and options.  Ms Gau has significant aphasia and has had this for a while- her daughter was previously able to communicate with her and understand her gestures to make her needs knows. Nichole [name] shares that this seems worsened since this admission. She is pleased that her Mom continues to eat a good amount.   As far as functional and nutritional status- there has been ongoing decline and even more now with this fall. Nichole [name] was tearful as she discussed the emotional toll it is to lose her Mom and to see her declining from the person that she knew.   We discussed her current illness and what it means in the larger context of her on-going co-morbidities.  Natural disease trajectory and expectations at EOL were discussed.  Palliative Care services outpatient were explained and offered.  Questions and concerns were addressed.  The family was encouraged to call with questions or concerns.    Primary Decision Maker NEXT OF KIN- daughter Nichole [name] and husband [name]    SUMMARY OF RECOMMENDATIONS -Plan for d/c tomorrow to [facility] -Meeting tomorrow morning at  10am for further GOC discussion and will complete MOST form at that visit -Family welcomes referral for outpatient Palliative to see at facility    Code Status/Advance Care Planning:  DNR  Prognosis:    Unable to determine- likely limited to less than a year due to dementia and continuing decline now with fall with subarachnoid hemorrhage  Discharge Planning: Skilled Nursing Facility for rehab with Palliative care service follow-up  Primary Diagnoses: Present on Admission: . Closed head injury   I have reviewed the medical record, interviewed the patient and family, and examined the patient. The following aspects are pertinent.  Past Medical History:  Diagnosis Date  . Ankle fracture, left 06/08/2014  . Anxiety   . Asthma   . Colon polyp   . COPD (chronic obstructive pulmonary disease) (HCC)   . Dementia (HCC)   . Depression   . Diverticular disease   . Hyperlipidemia   . Hypertension   . IFG (impaired fasting glucose)   . Lumbar burst fracture (HCC) 06/14/2014  . Memory loss   . Mild sleep apnea   . Multiple fractures of left foot 06/08/2014   Social History   Socioeconomic History  . Marital status: Married  Spouse name: [name]  . Number of children: 3  . Years of education: GED  . Highest education level: Not on file  Occupational History    Comment: Retired  Tobacco Use  . Smoking status: Former Smoker    Packs/day: 0.50    Years: 32.00    Pack years: 16.00    Types: Cigarettes    Start date: 04/11/1962    Quit date: 03/10/1994    Years since quitting: 26.7  . Smokeless tobacco: Never Used  Vaping Use  . Vaping Use: Never used  Substance and Sexual Activity  . Alcohol use: No    Alcohol/week: 0.0 standard drinks  . Drug use: No  . Sexual activity: Never  Other Topics Concern  . Not on file  Social History Narrative   Patient lives at home with her husband [name]). Patient is retired.    GED.   Caffeine- coffee- 1 cup daily   Right handed.    Social Determinants of Health   Financial Resource Strain: Not on file  Food Insecurity: Not on file  Transportation Needs: Not on file  Physical Activity: Not on file  Stress: Not on file  Social Connections: Not on file   Scheduled Meds: . buPROPion  150 mg Oral Daily  . diltiazem  300 mg Oral Daily  . donepezil  10 mg Oral q AM  . doxazosin  2 mg Oral Daily  . escitalopram  20 mg Oral Daily  . hydrochlorothiazide  25 mg Oral Daily  . oxybutynin  10 mg Oral QHS   Continuous Infusions: . 0.9 % sodium chloride with kcl 50 mL/hr at 11/21/20 1244   PRN Meds:.acetaminophen, albuterol, hydrALAZINE, labetalol, ondansetron **OR** ondansetron (ZOFRAN) IV Medications Prior to Admission:  Prior to Admission medications   Medication Sig Start Date End Date Taking? Authorizing Provider  acetaminophen (TYLENOL) 500 MG tablet Take 1 tablet (500 mg total) by mouth every 6 (six) hours as needed for mild pain or fever. For thoracic fracture. 08/29/20  Yes [name], [name], DO  albuterol (PROAIR HFA) 108 (90 Base) MCG/ACT inhaler Inhale 2 puffs into the lungs every 6 (six) hours as needed. for wheezing 04/25/19  Yes Merlyn Albert, MD  buPROPion (WELLBUTRIN XL) 150 MG 24 hr tablet Take one tablet po daily Patient taking differently: Take 150 mg by mouth daily. 04/30/20  Yes Babs Sciara, MD  diltiazem (CARDIZEM CD) 300 MG 24 hr capsule Take 1 capsule (300 mg total) by mouth daily. 08/22/20  Yes Taylor, [name], DO  donepezil (ARICEPT) 10 MG tablet TAKE (1) TABLET BY MOUTH EACH MORNING. Patient taking differently: Take 10 mg by mouth in the morning. 04/30/20  Yes Babs Sciara, MD  doxazosin (CARDURA) 2 MG tablet TAKE (1) TABLET BY MOUTH ONCE DAILY. Patient taking differently: Take 2 mg by mouth daily. 04/30/20  Yes Luking, Jonna Coup, MD  escitalopram (LEXAPRO) 20 MG tablet TAKE (1) TABLET BY MOUTH ONCE DAILY. Patient taking differently: Take 20 mg by mouth daily. 04/30/20  Yes Luking, Jonna Coup, MD   hydrochlorothiazide (HYDRODIURIL) 25 MG tablet TAKE (1) TABLET BY MOUTH ONCE DAILY. Patient taking differently: Take 25 mg by mouth daily. 04/30/20  Yes Babs Sciara, MD  meclizine (ANTIVERT) 25 MG tablet Take one tablet tid prn dizziness Patient taking differently: Take 25 mg by mouth 3 (three) times daily as needed for dizziness. 03/08/19  Yes Merlyn Albert, MD  meloxicam (MOBIC) 7.5 MG tablet TAKE (1) TABLET BY MOUTH  ONCE DAILY. Patient taking differently: Take 7.5 mg by mouth daily. 11/28/19  Yes Merlyn Albert, MD  oxybutynin (DITROPAN-XL) 10 MG 24 hr tablet TAKE (1) TABLET BY MOUTH AT BEDTIME Patient taking differently: Take 10 mg by mouth at bedtime. TAKE (1) TABLET BY MOUTH AT BEDTIME 04/30/20  Yes Babs Sciara, MD   Allergies  Allergen Reactions  . Iohexol Anaphylaxis and Shortness Of Breath    PT STATES SHE WENT INTO ANAPHYLACTIC SHOCK 30YRS AGO AFTER CT WITH CONTRAST AT [facility]:Onset Date: 10/10/2009   . Shellfish Allergy Anaphylaxis  . Morphine And Related Other (See Comments)    Unknown   . Penicillins Rash    Can tolerate cephalosporins   Review of Systems  Unable to perform ROS: Dementia    Vital Signs: BP (!) 150/60 (BP Location: Left Arm)   Pulse (!) 55   Temp (!) 100.8 F (38.2 C) (Axillary)   Resp 18   Ht 5' (1.524 m)   Wt 54.4 kg   SpO2 96%   BMI 23.44 kg/m  Pain Scale: PAINAD   Pain Score: 0-No pain   SpO2: SpO2: 96 % O2 Device:SpO2: 96 % O2 Flow Rate: .   IO: Intake/output summary:   Intake/Output Summary (Last 24 hours) at 11/21/2020 1537 Last data filed at 11/21/2020 0455 Gross per 24 hour  Intake 260 ml  Output 300 ml  Net -40 ml    LBM: Last BM Date: 11/18/20 Baseline Weight: Weight: 54.4 kg Most recent weight: Weight: 54.4 kg     Palliative Assessment/Data: PPS: 30%     Thank you for this consult. Palliative medicine will continue to follow and assist as needed.   Time In: 1502 Time Out: 1605 Time Total: 63  mins Greater than 50%  of this time was spent counseling and coordinating care related to the above assessment and plan.  Signed by: Ocie Bob, AGNP-C Palliative Medicine    Please contact Palliative Medicine Team phone at [phone] for questions and concerns.  For individual provider: See Loretha Stapler

## 2020-11-21 NOTE — Progress Notes (Signed)
Occupational Therapy Treatment Patient Details Name: Nichole [name] MRN: [id number] DOB: 1946-07-02 Today's Date: 11/21/2020    History of present illness 75 y.o. female who presented 4/2 after being transferred from [facility] in which pt arrived there following an unwitnessed fall at her SNF. Imaging revealed multifocal subarachnoid hemorrhage and superior R frontal parenchymal hemorrhage. Negative for c-spine fx. CT head 4/3 showing increased size of right frontal hemorrhage, increased mass effect with 28mm of midline shift and increased extra-axial fluid over the left convexity. PMH: HTN, multiple fxs of L foot/ankle, dementia, lumbar burst fx, HLD, depression, COPD, asthma, and anxiety.   OT comments  Pt continues to present with decreased cognition, balance, strength, and activity tolerance. Poor following of cues and engagement in ADLs. Facilitating self feeding task. Pt requiring Total hand over hand to bring spoon to mouth and at times pt pushing her hand away. Total A +2 for bed mobility and sit<>stand. Continue to recommend dc to SNF and will continue to follow acutely as admitted.    Follow Up Recommendations  SNF    Equipment Recommendations  Other (comment) (Defer to next venue)    Recommendations for Other Services PT consult    Precautions / Restrictions Precautions Precautions: Fall       Mobility Bed Mobility Overal bed mobility: Needs Assistance Bed Mobility: Supine to Sit;Sit to Supine     Supine to sit: +2 for physical assistance;Total assist Sit to supine: +2 for physical assistance;Total assist   General bed mobility comments: TotalA + 2 for all aspects of bed mobility    Transfers Overall transfer level: Needs assistance Equipment used: None Transfers: Sit to/from Stand Sit to Stand: Total assist         General transfer comment: Total A to power up and pt resisting upright posture of trunk    Balance Overall balance assessment: Needs  assistance Sitting-balance support: No upper extremity supported;Feet supported Sitting balance-Leahy Scale: Poor Sitting balance - Comments: Pt with poor trunk control with kyphotic posture, requiring min-maxA Postural control: Posterior lean;Left lateral lean (pushing) Standing balance support: Bilateral upper extremity supported;During functional activity Standing balance-Leahy Scale: Poor Standing balance comment: Total A                           ADL either performed or assessed with clinical judgement   ADL Overall ADL's : Needs assistance/impaired Eating/Feeding: Total assistance;Sitting Eating/Feeding Details (indicate cue type and reason): Attempting to facilitate hadn over hand for self feeding chocolate pudding. Pt however, pushing hand back when approaching face. Pt very receptive to total A for feeding and opening her mouth as pudding approaches. Total A for sitting balance atEOB due to L lean/pushing                                   General ADL Comments: Total A for ADLs due to cognition. Pt presenting with poor funcitonal use of LUE, decreased attention to L, and poor balance     Vision   Vision Assessment?: Yes;Vision impaired- to be further tested in functional context Alignment/Gaze Preference: Head turned;Gaze right   Perception     Praxis      Cognition Arousal/Alertness: Lethargic Behavior During Therapy: Flat affect Overall Cognitive Status: History of cognitive impairments - at baseline  General Comments: Dementia at baseline. Did not follow commands this session        Exercises     Shoulder Instructions       General Comments VSS    Pertinent Vitals/ Pain       Pain Assessment: Faces Faces Pain Scale: Hurts a little bit Pain Location: Generalized Pain Intervention(s): Monitored during session;Repositioned  Home Living                                           Prior Functioning/Environment              Frequency  Min 2X/week        Progress Toward Goals  OT Goals(current goals can now be found in the care plan section)  Progress towards OT goals: Not progressing toward goals - comment  Acute Rehab OT Goals Patient Stated Goal: Unable to state OT Goal Formulation: With family Time For Goal Achievement: 12/01/20 Potential to Achieve Goals: Good ADL Goals Pt Will Perform Eating: with min assist;sitting;bed level Pt Will Perform Grooming: with min assist;bed level;sitting Additional ADL Goal #1: Pt will perform bed mobility with Min A in preparation for ADLs Additional ADL Goal #2: Pt will tolerate sitting at EOB with Min A in preparation for ADLs Additional ADL Goal #3: Pt will attend to ADL items in L visual field with Mod cues  Plan Discharge plan remains appropriate    Co-evaluation    PT/OT/SLP Co-Evaluation/Treatment: Yes Reason for Co-Treatment: Necessary to address cognition/behavior during functional activity;Complexity of the patient's impairments (multi-system involvement);For patient/therapist safety;To address functional/ADL transfers   OT goals addressed during session: ADL's and self-care      AM-PAC OT "6 Clicks" Daily Activity     Outcome Measure   Help from another person eating meals?: A Lot Help from another person taking care of personal grooming?: A Lot Help from another person toileting, which includes using toliet, bedpan, or urinal?: Total Help from another person bathing (including washing, rinsing, drying)?: Total Help from another person to put on and taking off regular upper body clothing?: Total Help from another person to put on and taking off regular lower body clothing?: Total 6 Click Score: 8    End of Session Equipment Utilized During Treatment: Rolling [place];Gait belt  OT Visit Diagnosis: Unsteadiness on feet (R26.81);Other abnormalities of gait and mobility (R26.89);Muscle  weakness (generalized) (M62.81);Pain   Activity Tolerance Patient tolerated treatment well   Patient Left in bed;with call bell/phone within reach;with bed alarm set   Nurse Communication Mobility status        Time: 2671-2458 OT Time Calculation (min): 23 min  Charges: OT General Charges $OT Visit: 1 Visit OT Treatments $Self Care/Home Management : 8-22 mins  Tanecia Mccay MSOT, OTR/L Acute Rehab Pager: [phone] Office: [phone]   Theodoro Grist Shaheer Bonfield 11/21/2020, 4:18 PM

## 2020-11-21 NOTE — Progress Notes (Signed)
Physical Therapy Treatment Patient Details Name: Nichole [name] MRN: [id number] DOB: 10/29/1945 Today's Date: 11/21/2020    History of Present Illness 75 y.o. female who presented 4/2 after being transferred from [facility] in which pt arrived there following an unwitnessed fall at her SNF. Imaging revealed multifocal subarachnoid hemorrhage and superior R frontal parenchymal hemorrhage. Negative for c-spine fx. CT head 4/3 showing increased size of right frontal hemorrhage, increased mass effect with 10mm of midline shift and increased extra-axial fluid over the left convexity. PMH: HTN, multiple fxs of L foot/ankle, dementia, lumbar burst fx, HLD, depression, COPD, asthma, and anxiety.    PT Comments    The pt was seen by PT/OT to address deficits with stability, strength, and OOB mobility. The pt presents with decreased consistency in command following, increased need for physical support to maintain seated stability, and increased support to maintain static stance. The pt required totalA of 2 today to achieve partial stand, and was therefore unable to initiate steps with either HHA or AD. The pt was able to maintain sitting EOB for 15-20 min, but required mod/maxA to maintain upright due to strong L lateral lean with intermittent pushing with RUE. Continue to recommend skilled therapies to progress mobility and SNF at d/c.     Follow Up Recommendations  SNF;Supervision/Assistance - 24 hour     Equipment Recommendations   (defer to post acute)    Recommendations for Other Services       Precautions / Restrictions Precautions Precautions: Fall Restrictions Weight Bearing Restrictions: No    Mobility  Bed Mobility Overal bed mobility: Needs Assistance Bed Mobility: Supine to Sit;Sit to Supine     Supine to sit: +2 for physical assistance;Total assist Sit to supine: +2 for physical assistance;Total assist   General bed mobility comments: TotalA + 2 for all aspects of bed  mobility    Transfers Overall transfer level: Needs assistance Equipment used: 2 person hand held assist Transfers: Sit to/from Stand Sit to Stand: Total assist;+2 physical assistance         General transfer comment: Total A to power up and pt resisting upright posture of trunk  Ambulation/Gait             General Gait Details: pt unable to achieve full stand at this session, unable to initiate gait    Modified Rankin (Stroke Patients Only) Modified Rankin (Stroke Patients Only) Pre-Morbid Rankin Score: Moderately severe disability Modified Rankin: Severe disability     Balance Overall balance assessment: Needs assistance Sitting-balance support: No upper extremity supported;Feet supported Sitting balance-Leahy Scale: Poor Sitting balance - Comments: Pt with poor trunk control with kyphotic posture, requiring min-maxA Postural control: Posterior lean;Left lateral lean (pushing with RUE) Standing balance support: Bilateral upper extremity supported;During functional activity Standing balance-Leahy Scale: Poor Standing balance comment: Total A                            Cognition Arousal/Alertness: Lethargic Behavior During Therapy: Flat affect Overall Cognitive Status: History of cognitive impairments - at baseline                                 General Comments: Dementia at baseline. Did not follow commands this session      Exercises Other Exercises Other Exercises: lateral lean to elbow, maxA to maintain balance, use of facilitation at pelvis to initiate movements. maxA  to maintain seated balance    General Comments General comments (skin integrity, edema, etc.): VSS      Pertinent Vitals/Pain Pain Assessment: Faces Faces Pain Scale: Hurts a little bit Pain Location: Generalized Pain Intervention(s): Monitored during session;Limited activity within patient's tolerance;Repositioned           PT Goals (current goals can  now be found in the care plan section) Acute Rehab PT Goals Patient Stated Goal: unable to state PT Goal Formulation: With patient/family Time For Goal Achievement: 12/01/20 Potential to Achieve Goals: Fair Progress towards PT goals: Not progressing toward goals - comment    Frequency    Min 2X/week      PT Plan Current plan remains appropriate    Co-evaluation PT/OT/SLP Co-Evaluation/Treatment: Yes Reason for Co-Treatment: Necessary to address cognition/behavior during functional activity;Complexity of the patient's impairments (multi-system involvement);For patient/therapist safety PT goals addressed during session: Mobility/safety with mobility;Balance OT goals addressed during session: ADL's and self-care      AM-PAC PT "6 Clicks" Mobility   Outcome Measure  Help needed turning from your back to your side while in a flat bed without using bedrails?: Total Help needed moving from lying on your back to sitting on the side of a flat bed without using bedrails?: Total Help needed moving to and from a bed to a chair (including a wheelchair)?: Total Help needed standing up from a chair using your arms (e.g., wheelchair or bedside chair)?: Total Help needed to walk in hospital room?: Total Help needed climbing 3-5 steps with a railing? : Total 6 Click Score: 6    End of Session Equipment Utilized During Treatment: Gait belt Activity Tolerance: Patient limited by lethargy Patient left: in bed;with call bell/phone within reach;with bed alarm set Nurse Communication: Mobility status PT Visit Diagnosis: Unsteadiness on feet (R26.81);Other abnormalities of gait and mobility (R26.89);Muscle weakness (generalized) (M62.81);Repeated falls (R29.6);History of falling (Z91.81);Difficulty in walking, not elsewhere classified (R26.2);Other symptoms and signs involving the nervous system (R29.898);Hemiplegia and hemiparesis Hemiplegia - Right/Left: Left Hemiplegia - dominant/non-dominant:  Non-dominant Hemiplegia - caused by:  [facility] and superior R frontal parenchymal hemorrhage with fall)     Time: 1447-1510 PT Time Calculation (min) (ACUTE ONLY): 23 min  Charges:  $Therapeutic Activity: 8-22 mins                     Rolm Baptise, PT, DPT   Acute Rehabilitation Department Pager #: [phone]   Gaetana Michaelis 11/21/2020, 5:32 PM

## 2020-11-21 NOTE — TOC Progression Note (Signed)
Transition of Care [facility]) - Progression Note    Patient Details  Name: Nichole [name] MRN: [id number] Date of Birth: 10-29-1945  Transition of Care [facility]) CM/SW Contact  Eduard Roux, [place] Phone Number: 11/21/2020, 2:33 PM  Clinical Narrative:     CSW called patient's daughter,Cheryl and provided bed offers- she will share bed offers with family and call CSW back today with SNF choice.  Antony Blackbird, MSW, LCSW Clinical Social Worker   Expected Discharge Plan: Skilled Nursing Facility Barriers to Discharge: Continued Medical Work up,SNF Pending bed offer  Expected Discharge Plan and Services Expected Discharge Plan: Skilled Nursing Facility In-house Referral: Clinical Social Work     Living arrangements for the past 2 months:  (Long Term Care facility)                                       Social Determinants of Health (SDOH) Interventions    Readmission Risk Interventions No flowsheet data found.

## 2020-11-21 NOTE — TOC Progression Note (Signed)
Transition of Care [facility]) - Progression Note    Patient Details  Name: YAMILETTE GARRETSON MRN: [id number] Date of Birth: 02-08-1946  Transition [facility] Care [facility]) CM/SW Contact  Eduard Roux, [place] Phone Number: 11/21/2020, 3:01 PM  Clinical Narrative:     Patient's family accepted bed offer with [facility]- CSW called [facility], confirmed bed offer and informed of anticipated discharge tomorrow.   Antony Blackbird, MSW, LCSW Clinical Social Worker   Expected Discharge Plan: Skilled Nursing Facility Barriers to Discharge: Continued Medical Work up,SNF Pending bed offer  Expected Discharge Plan and Services Expected Discharge Plan: Skilled Nursing Facility In-house Referral: Clinical Social Work     Living arrangements for the past 2 months:  (Long Term Care facility)                                       Social Determinants of Health (SDOH) Interventions    Readmission Risk Interventions No flowsheet data found.

## 2020-11-22 ENCOUNTER — Inpatient Hospital Stay
Admission: RE | Admit: 2020-11-22 | Discharge: 2021-02-14 | Disposition: E | Payer: Medicare Other | Source: Ambulatory Visit | Attending: Internal Medicine | Admitting: Internal Medicine

## 2020-11-22 DIAGNOSIS — F0390 Unspecified dementia without behavioral disturbance: Secondary | ICD-10-CM

## 2020-11-22 NOTE — Discharge Instructions (Signed)
Please ensure patient is set up with [facility] at facility
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
## 2020-11-22 NOTE — Progress Notes (Signed)
Report called to [place] at the [facility].  Daughter, [name], notified that patient was being transferred at this time.  Patient discharged to [facility] via transport/stretcher.

## 2020-11-22 NOTE — TOC Transition Note (Signed)
Transition of Care [facility]) - CM/SW Discharge Note   Patient Details  Name: Nichole [name] MRN: [id number] Date of Birth: July 06, 1946  Transition of Care [facility]) CM/SW Contact:  Eduard Roux, LCSW Phone Number: 2020-12-02, 11:55 AM   Clinical Narrative:     Patient will Discharge to: [facility]  Discharge Date: 02-Dec-2020 Family Notified:Earl, spouse  Transport [facility]  Per MD patient is ready for discharge. RN, patient, and facility notified of discharge. Discharge Summary sent to facility. RN given number for [phone]. Ambulance transport requested for patient.   Clinical Social Worker signing off.   Final next level of care: Skilled Nursing Facility Barriers to Discharge: Barriers Resolved   Patient Goals and CMS Choice        Discharge Placement              Patient chooses bed at: [facility] Patient to be transferred to facility by: [facility] Name of family member notified: Earl,spouse Patient and family notified of of transfer: 12/02/2020  Discharge Plan and Services In-house Referral: Clinical Social Work                                   Social Determinants of Health (SDOH) Interventions     Readmission Risk Interventions No flowsheet data found.

## 2020-11-22 NOTE — Plan of Care (Signed)

## 2020-11-22 NOTE — Progress Notes (Addendum)
Subjective: Patient resting comfortably in bed  Objective: Vital signs in last 24 hours: Temp:  [97.6 F (36.4 C)-100.8 F (38.2 C)] 98.4 F (36.9 C) (04/07 1928) Pulse Rate:  [54-61] 56 (04/08 0308) Resp:  [16-21] 19 (04/08 0355) BP: (150-183)/(54-65) 168/65 (04/08 0308) SpO2:  [96 %-97 %] 97 % (04/07 1545)  Intake/Output from previous day: 04/07 0701 - 04/08 0700 In: 600 [P.O.:600] Out: 300 [Urine:300] Intake/Output this shift: No intake/output data recorded.  Neurologic: left hemiplegic  Lab Results: Lab Results  Component Value Date   WBC 6.7 11/16/2020   HGB 13.9 11/16/2020   HCT 43.1 11/16/2020   MCV 90.2 11/16/2020   PLT 401 (H) 11/16/2020   Lab Results  Component Value Date   INR 1.1 11/16/2020   BMET Lab Results  Component Value Date   NA 142 11/16/2020   K 3.7 11/16/2020   CL 105 11/16/2020   CO2 28 11/16/2020   GLUCOSE 100 (H) 11/16/2020   BUN 15 11/16/2020   CREATININE 0.45 11/16/2020   CALCIUM 8.7 (L) 11/16/2020    Studies/Results: CT HEAD WO CONTRAST  Result Date: 11/20/2020 CLINICAL DATA:  Follow-up cerebral hemorrhage. EXAM: CT HEAD WITHOUT CONTRAST TECHNIQUE: Contiguous axial images were obtained from the base of the skull through the vertex without intravenous contrast. COMPARISON:  11/17/2020 FINDINGS: Brain: Multifocal intracranial hemorrhage is again seen including hemorrhagic contusions in the frontal lobes at the vertex. The dominant hemorrhage in the right frontal lobe measures 3.5 x 4.8 x 3.7 cm, mildly decreased in size from the prior study. Mass effect on the body of the right lateral ventricle has mildly decreased. Edema in the high right greater than left frontal lobes has not significantly changed. Scattered small to moderate volume subarachnoid hemorrhage over both cerebral convexities is similar to the prior study. There is new small volume intraventricular hemorrhage in the occipital horns of both lateral ventricles. A low-density  subdural fluid collection over the left cerebral convexity has mildly enlarged and now measures up to 1.0 cm in thickness. There is no significant midline shift. There is mild cerebral atrophy. Patchy hypodensities in the cerebral white matter bilaterally are unchanged and nonspecific but compatible with moderate chronic small vessel ischemic disease. The basilar cisterns are patent. Vascular: Calcified atherosclerosis at the skull base. No hyperdense vessel. Skull: No fracture or suspicious osseous lesion. Sinuses/Orbits: Prior functional endoscopic sinus surgery. Minimal scattered mucosal thickening in the paranasal sinuses. Unchanged small right mastoid effusion. Unremarkable orbits. Other: Mildly decreased size of small right frontal scalp hematoma. IMPRESSION: 1. Multifocal intracranial hemorrhage as detailed above with mildly decreased size of the dominant right frontal hemorrhage. No midline shift. 2. New small volume intraventricular hemorrhage. 3. Mildly increased size of low-density subdural fluid collection over the left cerebral convexity, possibly a traumatic hygroma. 4. Unchanged subarachnoid hemorrhage. Electronically Signed   By: Sebastian Ache M.D.   On: 11/20/2020 14:53    Assessment/Plan: 75 year old s/p CHI with hemorrhagic contusions. Unchanged left hemiplegia, opens eyes to voice. Palliative care now involved. Awaiting SNF placement. Family meeting this morning at 10am   LOS: 6 days    Tiana Loft [facility] 13-Dec-2020, 7:50 AM

## 2020-11-25 ENCOUNTER — Non-Acute Institutional Stay (SKILLED_NURSING_FACILITY): Payer: Medicare Other | Admitting: Adult Health

## 2020-11-25 ENCOUNTER — Encounter: Payer: Self-pay | Admitting: Adult Health

## 2020-11-25 DIAGNOSIS — S06349S Traumatic hemorrhage of right cerebrum with loss of consciousness of unspecified duration, sequela: Secondary | ICD-10-CM | POA: Diagnosis not present

## 2020-11-25 DIAGNOSIS — G8194 Hemiplegia, unspecified affecting left nondominant side: Secondary | ICD-10-CM | POA: Insufficient documentation

## 2020-11-25 DIAGNOSIS — K5909 Other constipation: Secondary | ICD-10-CM

## 2020-11-25 DIAGNOSIS — J449 Chronic obstructive pulmonary disease, unspecified: Secondary | ICD-10-CM | POA: Diagnosis not present

## 2020-11-25 DIAGNOSIS — R3915 Urgency of urination: Secondary | ICD-10-CM

## 2020-11-25 DIAGNOSIS — I10 Essential (primary) hypertension: Secondary | ICD-10-CM

## 2020-11-25 DIAGNOSIS — F03B18 Unspecified dementia, moderate, with other behavioral disturbance: Secondary | ICD-10-CM

## 2020-11-25 DIAGNOSIS — S22080S Wedge compression fracture of T11-T12 vertebra, sequela: Secondary | ICD-10-CM

## 2020-11-25 DIAGNOSIS — F339 Major depressive disorder, recurrent, unspecified: Secondary | ICD-10-CM

## 2020-11-25 DIAGNOSIS — F0391 Unspecified dementia with behavioral disturbance: Secondary | ICD-10-CM

## 2020-11-25 NOTE — Progress Notes (Signed)
Location:  [facility] Room Number: 151/P Place of Service:  SNF (31)   CODE STATUS: DNR  Allergies  Allergen Reactions  . Iohexol Anaphylaxis and Shortness Of Breath    PT STATES SHE WENT INTO ANAPHYLACTIC SHOCK 30YRS AGO AFTER CT WITH CONTRAST AT [place]:Onset Date: 10/10/2009   . Shellfish Allergy Anaphylaxis  . Morphine And Related Other (See Comments)    Unknown   . Penicillins Rash    Can tolerate cephalosporins    Chief Complaint  Patient presents with  . Hospitalization Follow-up    Hospitalization Follow Up     HPI:  She is a 75 year old woman who has been hospitalized from 11-16-20 through 12-02-2020. Her medical history includes: copd; hypertension; dementia. She had been in another facility when she suffered a fall with a CHI with a large R frontal ICH. She did have a palliative care consult while in the hospital. She is awake; will not make eye contact. She has left hemiplegia. She was very constipated over the weekend and did require a fleets enema. She is here for long term placement. She will continue to be followed for her chronic illnesses including: Moderate dementia with behavioral disturbance:    Traumatic hemorrhage of right cerebrum with loss of consciousness sequela: left side hemiplegia:   Chronic obstructive pulmonary disease unspecified COPD type    Past Medical History:  Diagnosis Date  . Ankle fracture, left 06/08/2014  . Anxiety   . Asthma   . Colon polyp   . COPD (chronic obstructive pulmonary disease) (HCC)   . Dementia (HCC)   . Depression   . Diverticular disease   . Hyperlipidemia   . Hypertension   . IFG (impaired fasting glucose)   . Lumbar burst fracture (HCC) 06/14/2014  . Memory loss   . Mild sleep apnea   . Multiple fractures of left foot 06/08/2014    Past Surgical History:  Procedure Laterality Date  . ABDOMINAL HYSTERECTOMY    . ABDOMINAL SURGERY    . ANTERIOR LAT LUMBAR FUSION Left 06/22/2014    Procedure: Left Lumbar one Corpectomy with Fusion Thoracic twelve-Lumbar two,lateral plating,posterior percutaneous pedicle screws;  Surgeon: Temple Pacini, MD;  Location: [facility];  Service: Neurosurgery;  Laterality: Left;  . APPENDECTOMY    . BACK SURGERY    . CERVICAL FUSION  October 2007  . LUMBAR PERCUTANEOUS PEDICLE SCREW 1 LEVEL N/A 06/22/2014   Procedure: LUMBAR PERCUTANEOUS PEDICLE SCREW 1 LEVEL;  Surgeon: Temple Pacini, MD;  Location: [facility];  Service: Neurosurgery;  Laterality: N/A;  . NECK SURGERY    . ORIF TIBIA FRACTURE Left 06/04/2014   Procedure: OPEN REDUCTION INTERNAL FIXATION (ORIF) PILON FRACTURE AND OPEN REDUCTION INTERNAL FIXATION (ORIF) OF SYNDESMOTIC FRACTURE;  Surgeon: Eldred Manges, MD;  Location: [facility];  Service: Orthopedics;  Laterality: Left;  Big C-arm, Flat Jackson, Biomet Anterolateral plates, cancellous bone graft 20cc. Surgeon will call. Available after 5pm  . ORIF TIBIA FRACTURE Left 06/14/2014   DR Gerlene Fee  . SIGMOIDECTOMY    . TONSILLECTOMY      Social History   Socioeconomic History  . Marital status: Married    Spouse name: [name]  . Number of children: 3  . Years of education: GED  . Highest education level: Not on file  Occupational History    Comment: Retired  Tobacco Use  . Smoking status: Former Smoker    Packs/day: 0.50    Years: 32.00  Pack years: 16.00    Types: Cigarettes    Start date: 04/11/1962    Quit date: 03/10/1994    Years since quitting: 26.7  . Smokeless tobacco: Never Used  Vaping Use  . Vaping Use: Never used  Substance and Sexual Activity  . Alcohol use: No    Alcohol/week: 0.0 standard drinks  . Drug use: No  . Sexual activity: Never  Other Topics Concern  . Not on file  Social History Narrative   Patient lives at home with her husband [name]). Patient is retired.    GED.   Caffeine- coffee- 1 cup daily   Right handed.   Social Determinants of Health   Financial Resource Strain: Not on file  Food  Insecurity: Not on file  Transportation Needs: Not on file  Physical Activity: Not on file  Stress: Not on file  Social Connections: Not on file  Intimate Partner Violence: Not on file   Family History  Problem Relation Age of Onset  . Hypertension Father   . Diabetes Father   . Heart attack Father       VITAL SIGNS BP (!) 160/66   Pulse (!) 50   Temp 97.7 F (36.5 C)   Resp 20   Ht 5' (1.524 m)   Wt 120 lb (54.4 kg)   SpO2 92%   BMI 23.44 kg/m   Outpatient Encounter Medications as of 11/25/2020  Medication Sig  . acetaminophen (TYLENOL) 500 MG tablet Take 1 tablet (500 mg total) by mouth every 6 (six) hours as needed for mild pain or fever. For thoracic fracture.  Marland Kitchen albuterol (PROAIR HFA) 108 (90 Base) MCG/ACT inhaler Inhale 2 puffs into the lungs every 6 (six) hours as needed. for wheezing  . buPROPion (WELLBUTRIN XL) 150 MG 24 hr tablet Take one tablet po daily  . diltiazem (CARDIZEM CD) 300 MG 24 hr capsule Take 1 capsule (300 mg total) by mouth daily.  Marland Kitchen donepezil (ARICEPT) 10 MG tablet TAKE (1) TABLET BY MOUTH EACH MORNING.  Marland Kitchen doxazosin (CARDURA) 2 MG tablet TAKE (1) TABLET BY MOUTH ONCE DAILY.  Marland Kitchen escitalopram (LEXAPRO) 20 MG tablet TAKE (1) TABLET BY MOUTH ONCE DAILY.  . hydrochlorothiazide (HYDRODIURIL) 25 MG tablet TAKE (1) TABLET BY MOUTH ONCE DAILY.  . meclizine (ANTIVERT) 25 MG tablet Take one tablet tid prn dizziness  . meloxicam (MOBIC) 7.5 MG tablet TAKE (1) TABLET BY MOUTH ONCE DAILY.  . NON FORMULARY Diet: Pureed, NAS Diet ALLERGIC TO SHELLFISH - ANAPHYLACTIC REACTION!!!  . oxybutynin (DITROPAN-XL) 10 MG 24 hr tablet TAKE (1) TABLET BY MOUTH AT BEDTIME   No facility-administered encounter medications on file as of 11/25/2020.     SIGNIFICANT DIAGNOSTIC EXAMS  TODAY  11-16-20: ct of head and cervical spine:  1. Multifocal subarachnoid hemorrhage correlating with trauma history. 2. ~3 cm superior right frontal parenchymal hemorrhage, presumed  hemorrhagic contusion in this setting. 3. Age advanced brain atrophy. 4. Negative for cervical spine fracture.  11-16-20: pelvic x-ray:  1. No acute fracture or malalignment. 2. The bones are diffusely demineralized suggesting underlying osteoporosis. This limits the sensitivity for detection of nondisplaced fractures by conventional x-ray. 3. Moderate amount of formed stool within the rectum concerning for constipation/impaction.  11-20-20: ct of head:  1. Multifocal intracranial hemorrhage as detailed above with mildly decreased size of the dominant right frontal hemorrhage. No midline shift. 2. New small volume intraventricular hemorrhage. 3. Mildly increased size of low-density subdural fluid collection over the left cerebral convexity, possibly  a traumatic hygroma. 4. Unchanged subarachnoid hemorrhage.   LABS REVIEWED TODAY  11-16-20: wbc 6.7; hgb 13.9; hct 43.1; mcv 90.2 plt 401; glucose 100; bun 15; creat 0.45; k+ 3.7; na++ 142; ca 8.7; GFR>60; liver normal albumin 3.5    Review of Systems  Unable to perform ROS: Dementia (nonverbal )    Physical Exam Constitutional:      General: She is not in acute distress.    Appearance: She is underweight. She is not diaphoretic.     Comments: Is aware   Neck:     Thyroid: No thyromegaly.  Cardiovascular:     Rate and Rhythm: Normal rate and regular rhythm.     Pulses: Normal pulses.     Heart sounds: Normal heart sounds.     Comments: 2/6 Pulmonary:     Effort: Pulmonary effort is normal. No respiratory distress.     Breath sounds: Normal breath sounds.  Abdominal:     General: Bowel sounds are normal. There is no distension.     Palpations: Abdomen is soft.     Tenderness: There is no abdominal tenderness.  Musculoskeletal:     Cervical back: Neck supple.     Right lower leg: No edema.     Left lower leg: No edema.     Comments: Has edema to left hand Left side flaccid Is able to move right extremities.  Neck is hyperextended   History of cervical and lumbar surgeries.   Lymphadenopathy:     Cervical: No cervical adenopathy.  Skin:    General: Skin is warm and dry.  Neurological:     Comments: Is status post right frontal lobe hemorrhage: 3.5 x 4.8 x 3.7 cm   Psychiatric:        Mood and Affect: Mood normal.       ASSESSMENT/ PLAN:  TODAY  1. Moderate dementia with behavioral disturbance: she is without change; is nonverbal;her weight is 120 pounds; will continue aricept 10 mg daily   2. Traumatic hemorrhage of right cerebrum with loss of consciousness sequela: left side hemiplegia: is without change: will continue therapy as directed to improve upon her mobility   3.  Chronic obstructive pulmonary disease unspecified COPD type: is stable will continue albuterol 2 puffs every 6 hours as needed is on room air  4. Essential hypertension; benign: is without change: b/p 160/66: will stop hctz as this is a high risk medications; will continue cardura 2 mg daily cardizem cd 300 mg daily will begin lisinopril 5 mg daily and will repeat bmp on 12-02-20.   5. Urinary urgency: is without change: is unable to communicate her need to void; at this time; it is highly unlikely that the ditropan xl 10 mg is providing her any benefit will stop this medication and will monitor her status.   6. Major depression recurrent chronic: will continue lexapro 20 mg daily and wellbutrin xl 150 mg daily   7. Closed wedge compression fracture of T 11 vertebrae sequela (07/2020) will continue mobic 7.5 mg daily and will monitor   Will check cbc  cmp on 11-28-20.   Time spent with patient and family: 45 minutes: >50% spend with counseling and coordination of care:therapy goals of care and  discussed long term prognosis    Synthia Innocent NP [facility]  Contact [phone] Monday through Friday 8am- 5pm  After hours call [phone]

## 2020-11-26 ENCOUNTER — Encounter: Payer: Self-pay | Admitting: Adult Health

## 2020-11-26 ENCOUNTER — Non-Acute Institutional Stay (SKILLED_NURSING_FACILITY): Payer: Medicare Other | Admitting: Adult Health

## 2020-11-26 DIAGNOSIS — I10 Essential (primary) hypertension: Secondary | ICD-10-CM | POA: Diagnosis not present

## 2020-11-26 NOTE — Progress Notes (Signed)
Location:  [facility] Room Number: 222 Place of Service:  SNF (31)   CODE STATUS: full code   Allergies  Allergen Reactions  . Iohexol Anaphylaxis and Shortness Of Breath    PT STATES SHE WENT INTO ANAPHYLACTIC SHOCK 67YRS AGO AFTER CT WITH CONTRAST AT [place]:Onset Date: 10/10/2009   . Shellfish Allergy Anaphylaxis  . Morphine And Related Other (See Comments)    Unknown   . Penicillins Rash    Can tolerate cephalosporins    Chief Complaint  Patient presents with  . Acute Visit    Blood pressure     HPI:  Her blood pressure readings have remained elevated with 200/70 this AM; she has been given clonidine 0.1 mg one time. There are no indications of distress present.   Past Medical History:  Diagnosis Date  . Ankle fracture, left 06/08/2014  . Anxiety   . Asthma   . Colon polyp   . COPD (chronic obstructive pulmonary disease) (HCC)   . Dementia (HCC)   . Depression   . Diverticular disease   . Hyperlipidemia   . Hypertension   . IFG (impaired fasting glucose)   . Lumbar burst fracture (HCC) 06/14/2014  . Memory loss   . Mild sleep apnea   . Multiple fractures of left foot 06/08/2014    Past Surgical History:  Procedure Laterality Date  . ABDOMINAL HYSTERECTOMY    . ABDOMINAL SURGERY    . ANTERIOR LAT LUMBAR FUSION Left 06/22/2014   Procedure: Left Lumbar one Corpectomy with Fusion Thoracic twelve-Lumbar two,lateral plating,posterior percutaneous pedicle screws;  Surgeon: Temple Pacini, MD;  Location: [facility];  Service: Neurosurgery;  Laterality: Left;  . APPENDECTOMY    . BACK SURGERY    . CERVICAL FUSION  October 2007  . LUMBAR PERCUTANEOUS PEDICLE SCREW 1 LEVEL N/A 06/22/2014   Procedure: LUMBAR PERCUTANEOUS PEDICLE SCREW 1 LEVEL;  Surgeon: Temple Pacini, MD;  Location: [facility];  Service: Neurosurgery;  Laterality: N/A;  . NECK SURGERY    . ORIF TIBIA FRACTURE Left 06/04/2014   Procedure: OPEN REDUCTION INTERNAL FIXATION  (ORIF) PILON FRACTURE AND OPEN REDUCTION INTERNAL FIXATION (ORIF) OF SYNDESMOTIC FRACTURE;  Surgeon: Eldred Manges, MD;  Location: [facility];  Service: Orthopedics;  Laterality: Left;  Big C-arm, Flat Jackson, Biomet Anterolateral plates, cancellous bone graft 20cc. Surgeon will call. Available after 5pm  . ORIF TIBIA FRACTURE Left 06/14/2014   DR Gerlene Fee  . SIGMOIDECTOMY    . TONSILLECTOMY      Social History   Socioeconomic History  . Marital status: Married    Spouse name: [name]  . Number of children: 3  . Years of education: GED  . Highest education level: Not on file  Occupational History    Comment: Retired  Tobacco Use  . Smoking status: Former Smoker    Packs/day: 0.50    Years: 32.00    Pack years: 16.00    Types: Cigarettes    Start date: 04/11/1962    Quit date: 03/10/1994    Years since quitting: 26.7  . Smokeless tobacco: Never Used  Vaping Use  . Vaping Use: Never used  Substance and Sexual Activity  . Alcohol use: No    Alcohol/week: 0.0 standard drinks  . Drug use: No  . Sexual activity: Never  Other Topics Concern  . Not on file  Social History Narrative   Patient lives at home with her husband [name]). Patient is retired.  GED.   Caffeine- coffee- 1 cup daily   Right handed.   Social Determinants of Health   Financial Resource Strain: Not on file  Food Insecurity: Not on file  Transportation Needs: Not on file  Physical Activity: Not on file  Stress: Not on file  Social Connections: Not on file  Intimate Partner Violence: Not on file   Family History  Problem Relation Age of Onset  . Hypertension Father   . Diabetes Father   . Heart attack Father       VITAL SIGNS BP (!) 200/70   Pulse 60   Temp 98.1 F (36.7 C)   Resp 18   Ht 5' (1.524 m)   Wt 120 lb (54.4 kg)   BMI 23.44 kg/m   Outpatient Encounter Medications as of 11/26/2020  Medication Sig  . acetaminophen (TYLENOL) 500 MG tablet Take 1 tablet (500 mg total) by mouth every 6  (six) hours as needed for mild pain or fever. For thoracic fracture.  Marland Kitchen albuterol (PROAIR HFA) 108 (90 Base) MCG/ACT inhaler Inhale 2 puffs into the lungs every 6 (six) hours as needed. for wheezing  . buPROPion (WELLBUTRIN XL) 150 MG 24 hr tablet Take one tablet po daily  . diltiazem (CARDIZEM CD) 300 MG 24 hr capsule Take 1 capsule (300 mg total) by mouth daily.  Marland Kitchen donepezil (ARICEPT) 10 MG tablet TAKE (1) TABLET BY MOUTH EACH MORNING.  Marland Kitchen doxazosin (CARDURA) 2 MG tablet TAKE (1) TABLET BY MOUTH ONCE DAILY.  Marland Kitchen escitalopram (LEXAPRO) 20 MG tablet TAKE (1) TABLET BY MOUTH ONCE DAILY.  Marland Kitchen lisinopril (ZESTRIL) 5 MG tablet Take 5 mg by mouth daily.  . meclizine (ANTIVERT) 25 MG tablet Take one tablet tid prn dizziness  . meloxicam (MOBIC) 7.5 MG tablet TAKE (1) TABLET BY MOUTH ONCE DAILY.  . NON FORMULARY Diet: Pureed, NAS Diet ALLERGIC TO SHELLFISH - ANAPHYLACTIC REACTION!!!   No facility-administered encounter medications on file as of 11/26/2020.     SIGNIFICANT DIAGNOSTIC EXAMS   PREVIOUS   11-16-20: ct of head and cervical spine:  1. Multifocal subarachnoid hemorrhage correlating with trauma history. 2. ~3 cm superior right frontal parenchymal hemorrhage, presumed hemorrhagic contusion in this setting. 3. Age advanced brain atrophy. 4. Negative for cervical spine fracture.  11-16-20: pelvic x-ray:  1. No acute fracture or malalignment. 2. The bones are diffusely demineralized suggesting underlying osteoporosis. This limits the sensitivity for detection of nondisplaced fractures by conventional x-ray. 3. Moderate amount of formed stool within the rectum concerning for constipation/impaction.  11-20-20: ct of head:  1. Multifocal intracranial hemorrhage as detailed above with mildly decreased size of the dominant right frontal hemorrhage. No midline shift. 2. New small volume intraventricular hemorrhage. 3. Mildly increased size of low-density subdural fluid collection over the left  cerebral convexity, possibly a traumatic hygroma. 4. Unchanged subarachnoid hemorrhage.  NO NEW EXAMS.    LABS REVIEWED PREVIOUS   11-16-20: wbc 6.7; hgb 13.9; hct 43.1; mcv 90.2 plt 401; glucose 100; bun 15; creat 0.45; k+ 3.7; na++ 142; ca 8.7; GFR>60; liver normal albumin 3.5   NO NEW LABS.    Review of Systems  Unable to perform ROS: Dementia (nonverbal )   Physical Exam Constitutional:      General: She is not in acute distress.    Appearance: She is well-developed. She is not diaphoretic.     Comments: Is aware   Neck:     Thyroid: No thyromegaly.  Cardiovascular:     Rate  and Rhythm: Normal rate and regular rhythm.     Pulses: Normal pulses.     Heart sounds: Murmur heard.      Comments: 2/6 Pulmonary:     Effort: Pulmonary effort is normal. No respiratory distress.     Breath sounds: Normal breath sounds.  Abdominal:     General: Bowel sounds are normal. There is no distension.     Palpations: Abdomen is soft.     Tenderness: There is no abdominal tenderness.  Musculoskeletal:     Cervical back: Neck supple.     Right lower leg: No edema.     Left lower leg: No edema.     Comments: Has edema to left hand Left side flaccid Is able to move right extremities.  Neck is hyperextended  History of cervical and lumbar surgeries.   Lymphadenopathy:     Cervical: No cervical adenopathy.  Skin:    General: Skin is warm and dry.  Neurological:     Comments: Is status post right frontal lobe hemorrhage: 3.5 x 4.8 x 3.7 cm    Psychiatric:        Mood and Affect: Mood normal.      ASSESSMENT/ PLAN:  TODAY  1. Essential hypertension; benign: is worse; b/p 200/70 will continue cardura 2 mg daily cardizem cd 300 mg daily will increase to lisinopril 10 mg daily and will give clonidine 0.1 mg one time. Bmp due 12-02-20    Synthia Innocent NP [facility]  Contact [phone] Monday through Friday 8am- 5pm  After hours call [phone]

## 2020-11-27 ENCOUNTER — Non-Acute Institutional Stay (SKILLED_NURSING_FACILITY): Payer: Medicare Other | Admitting: Internal Medicine

## 2020-11-27 ENCOUNTER — Encounter: Payer: Self-pay | Admitting: Internal Medicine

## 2020-11-27 DIAGNOSIS — G8194 Hemiplegia, unspecified affecting left nondominant side: Secondary | ICD-10-CM

## 2020-11-27 DIAGNOSIS — I10 Essential (primary) hypertension: Secondary | ICD-10-CM | POA: Diagnosis not present

## 2020-11-27 DIAGNOSIS — S06349S Traumatic hemorrhage of right cerebrum with loss of consciousness of unspecified duration, sequela: Secondary | ICD-10-CM | POA: Diagnosis not present

## 2020-11-27 NOTE — Progress Notes (Signed)
   NURSING HOME LOCATION:  [facility] ROOM NUMBER:    CODE STATUS: DNR  PCP:  Laroy Apple DO  This is a comprehensive admission note to this SNFperformed on this date less than 30 days from date of admission. Included are preadmission medical/surgical history; reconciled medication list; family history; social history and comprehensive review of systems.  Corrections and additions to the records were documented. Comprehensive physical exam was also performed. Additionally a clinical summary was entered for each active diagnosis pertinent to this admission in the Problem List to enhance continuity of care.  HPI: She was hospitalized 4/2-04/09/2020 after suffering a large right frontal intracranial hemorrhage in the context of a fall @ another SNF. Imaging actually revealed multifocal intracranial hemorrhage and subarachnoid hemorrhage but the right frontal bleed with dominant process.  She remained intermittently arousable with dense left hemiparesis.  The patient would open her eyes and speak briefly to her daughter and would eat fairly well.  Vital signs remained stable and she was afebrile.  Hospice was consulted and DNR order placed. Pertinent labs included total protein of 6.2; albumin was low normal at 3.5.  Past medical and surgical history: Includes a history of mild sleep apnea, essential hypertension, dyslipidemia, history of diverticulosis, dementia, COPD, and history of depression. Surgeries and procedures include abdominal hysterectomy, lumbar fusion, cervical fusion, and tibial fracture surgery.  Social history: Nondrinker.Former 16-pack-year smoker.  Family history: Reviewed   Review of systems:AMS precluded ROS completion.   Physical exam:  Pertinent or positive findings: Staff reports when the nonparalyzed limb is raised she will sometimes look at the examiner.  Today she remained asleep and nonverbal.  She appears chronically ill and malnourished.  Hair  is disheveled.  As noted she did not open her eyes during the exam.  She is edentulous.  She is wearing a cervical collar.  Grade 1 systolic murmur suggested.  Rhonchorous expiratory breath sounds are present diffusely.  Feet are in booties.  The left upper extremity was flaccid and dropped to the bed when lifted and released.  Limbs are atrophic. When I raised the right upper extremity she slowly let it drift to the bed.  Bruising is present over the right lateral orbital area and lateral malar area.  There is also bruising over the dorsum of the left hand.  General appearance: no acute distress, increased work of breathing is present.   Lymphatic: No lymphadenopathy about the head, neck, axilla. Eyes: No conjunctival inflammation or lid edema is present. There is no scleral icterus. Ears:  External ear exam shows no significant lesions or deformities.   Nose:  External nasal examination shows no deformity or inflammation. Nasal mucosa are pink and moist without lesions, exudates Neck:  No thyromegaly, masses, tenderness noted.    Heart:  Normal rate and regular rhythm. S1 and S2 normal without gallop,  click, rub.  Lungs:  without wheezes, rales, rubs. Abdomen: Bowel sounds are normal.  Abdomen is soft and nontender with no organomegaly, hernias, masses. GU: Deferred  Extremities:  No cyanosis, clubbing, edema. Neurologic exam: Balance, Rhomberg, finger to nose testing could not be completed due to clinical state Skin: Warm & dry w/o tenting. No significant  rash.  See clinical summary under each active problem in the Problem List with associated updated therapeutic plan

## 2020-11-27 NOTE — Assessment & Plan Note (Signed)
Significant systolic hypertension has intermittently been noted but at this time blood pressure is adequately controlled.  No change indicated.

## 2020-11-27 NOTE — Patient Instructions (Signed)
See assessment and plan under each diagnosis in the problem list and acutely for this visit 

## 2020-11-27 NOTE — Assessment & Plan Note (Signed)
Supportive care at SNF.  She remains DNR

## 2020-11-27 NOTE — Assessment & Plan Note (Signed)
Left upper extremity is flaccid.  Left lower extremity not tested as both lower extremities are in protective booties.

## 2020-11-28 ENCOUNTER — Other Ambulatory Visit (HOSPITAL_COMMUNITY)
Admission: RE | Admit: 2020-11-28 | Discharge: 2020-11-28 | Disposition: A | Discharge status: Home / Self Care | Payer: Medicare Other | Source: Skilled Nursing Facility | Attending: Adult Health | Admitting: Adult Health

## 2020-11-28 DIAGNOSIS — S06360D Traumatic hemorrhage of cerebrum, unspecified, without loss of consciousness, subsequent encounter: Secondary | ICD-10-CM | POA: Insufficient documentation

## 2020-11-28 LAB — CBC
HCT: 42.9 % (ref 36.0–46.0)
Hemoglobin: 13.6 g/dL (ref 12.0–15.0)
MCH: 28.9 pg (ref 26.0–34.0)
MCHC: 31.7 g/dL (ref 30.0–36.0)
MCV: 91.3 fL (ref 80.0–100.0)
Platelets: 625 10*3/uL — ABNORMAL HIGH (ref 150–400)
RBC: 4.7 MIL/uL (ref 3.87–5.11)
RDW: 13.3 % (ref 11.5–15.5)
WBC: 9.7 10*3/uL (ref 4.0–10.5)
nRBC: 0 % (ref 0.0–0.2)

## 2020-11-28 LAB — COMPREHENSIVE METABOLIC PANEL
ALT: 30 U/L (ref 0–44)
AST: 21 U/L (ref 15–41)
Albumin: 2.8 g/dL — ABNORMAL LOW (ref 3.5–5.0)
Alkaline Phosphatase: 113 U/L (ref 38–126)
Anion gap: 10 (ref 5–15)
BUN: 20 mg/dL (ref 8–23)
CO2: 25 mmol/L (ref 22–32)
Calcium: 8.7 mg/dL — ABNORMAL LOW (ref 8.9–10.3)
Chloride: 107 mmol/L (ref 98–111)
Creatinine, Ser: 0.38 mg/dL — ABNORMAL LOW (ref 0.44–1.00)
GFR, Estimated: >60 mL/min (ref >60)
Glucose, Bld: 91 mg/dL (ref 70–99)
Potassium: 3.9 mmol/L (ref 3.5–5.1)
Sodium: 142 mmol/L (ref 135–145)
Total Bilirubin: 0.7 mg/dL (ref 0.3–1.2)
Total Protein: 5.8 g/dL — ABNORMAL LOW (ref 6.5–8.1)

## 2020-12-02 ENCOUNTER — Non-Acute Institutional Stay (SKILLED_NURSING_FACILITY): Payer: Medicare Other | Admitting: Adult Health

## 2020-12-02 ENCOUNTER — Encounter: Payer: Self-pay | Admitting: Adult Health

## 2020-12-02 DIAGNOSIS — I10 Essential (primary) hypertension: Secondary | ICD-10-CM | POA: Diagnosis not present

## 2020-12-02 NOTE — Progress Notes (Signed)
Location:  [facility] Room Number: 151/P Place of Service:  SNF (31)   CODE STATUS: DNR  Allergies  Allergen Reactions  . Iohexol Anaphylaxis and Shortness Of Breath    PT STATES SHE WENT INTO ANAPHYLACTIC SHOCK 59YRS AGO AFTER CT WITH CONTRAST AT [place]:Onset Date: 10/10/2009   . Shellfish Allergy Anaphylaxis  . Morphine And Related Other (See Comments)    Unknown   . Penicillins Rash    Can tolerate cephalosporins    Chief Complaint  Patient presents with  . Acute Visit    Hypertension     HPI:  Her blood pressure remains elevated at 200/78; by nursing reports.   Past Medical History:  Diagnosis Date  . Ankle fracture, left 06/08/2014  . Anxiety   . Asthma   . Colon polyp   . COPD (chronic obstructive pulmonary disease) (HCC)   . Dementia (HCC)   . Depression   . Diverticular disease   . Hyperlipidemia   . Hypertension   . IFG (impaired fasting glucose)   . Lumbar burst fracture (HCC) 06/14/2014  . Memory loss   . Mild sleep apnea   . Multiple fractures of left foot 06/08/2014    Past Surgical History:  Procedure Laterality Date  . ABDOMINAL HYSTERECTOMY    . ABDOMINAL SURGERY    . ANTERIOR LAT LUMBAR FUSION Left 06/22/2014   Procedure: Left Lumbar one Corpectomy with Fusion Thoracic twelve-Lumbar two,lateral plating,posterior percutaneous pedicle screws;  Surgeon: Temple Pacini, MD;  Location: [facility];  Service: Neurosurgery;  Laterality: Left;  . APPENDECTOMY    . BACK SURGERY    . CERVICAL FUSION  October 2007  . LUMBAR PERCUTANEOUS PEDICLE SCREW 1 LEVEL N/A 06/22/2014   Procedure: LUMBAR PERCUTANEOUS PEDICLE SCREW 1 LEVEL;  Surgeon: Temple Pacini, MD;  Location: [facility];  Service: Neurosurgery;  Laterality: N/A;  . NECK SURGERY    . ORIF TIBIA FRACTURE Left 06/04/2014   Procedure: OPEN REDUCTION INTERNAL FIXATION (ORIF) PILON FRACTURE AND OPEN REDUCTION INTERNAL FIXATION (ORIF) OF SYNDESMOTIC FRACTURE;  Surgeon: Eldred Manges, MD;  Location: [facility];  Service: Orthopedics;  Laterality: Left;  Big C-arm, Flat Jackson, Biomet Anterolateral plates, cancellous bone graft 20cc. Surgeon will call. Available after 5pm  . ORIF TIBIA FRACTURE Left 06/14/2014   DR Gerlene Fee  . SIGMOIDECTOMY    . TONSILLECTOMY      Social History   Socioeconomic History  . Marital status: Married    Spouse name: [name]  . Number of children: 3  . Years of education: GED  . Highest education level: Not on file  Occupational History    Comment: Retired  Tobacco Use  . Smoking status: Former Smoker    Packs/day: 0.50    Years: 32.00    Pack years: 16.00    Types: Cigarettes    Start date: 04/11/1962    Quit date: 03/10/1994    Years since quitting: 26.7  . Smokeless tobacco: Never Used  Vaping Use  . Vaping Use: Never used  Substance and Sexual Activity  . Alcohol use: No    Alcohol/week: 0.0 standard drinks  . Drug use: No  . Sexual activity: Never  Other Topics Concern  . Not on file  Social History Narrative   Patient lives at home with her husband [name]). Patient is retired.    GED.   Caffeine- coffee- 1 cup daily   Right handed.   Social Determinants of Health  Financial Resource Strain: Not on file  Food Insecurity: Not on file  Transportation Needs: Not on file  Physical Activity: Not on file  Stress: Not on file  Social Connections: Not on file  Intimate Partner Violence: Not on file   Family History  Problem Relation Age of Onset  . Hypertension Father   . Diabetes Father   . Heart attack Father       VITAL SIGNS BP 124/62   Pulse 88   Temp 98.5 F (36.9 C)   Resp 20   Ht 5' (1.524 m)   Wt 120 lb (54.4 kg)   SpO2 93%   BMI 23.44 kg/m   Outpatient Encounter Medications as of 12/02/2020  Medication Sig  . acetaminophen (TYLENOL) 500 MG tablet Take 1 tablet (500 mg total) by mouth every 6 (six) hours as needed for mild pain or fever. For thoracic fracture.  Marland Kitchen albuterol (PROAIR HFA) 108  (90 Base) MCG/ACT inhaler Inhale 2 puffs into the lungs every 6 (six) hours as needed. for wheezing  . Balsam [place]-Castor Oil (VENELEX) OINT Apply topically. Special Instructions: Apply to sacrum, bilateral buttocks & coccyx qshift for prevention.  Marland Kitchen buPROPion (WELLBUTRIN XL) 150 MG 24 hr tablet Take one tablet po daily  . diltiazem (CARDIZEM CD) 300 MG 24 hr capsule Take 1 capsule (300 mg total) by mouth daily.  Marland Kitchen donepezil (ARICEPT) 10 MG tablet TAKE (1) TABLET BY MOUTH EACH MORNING.  Marland Kitchen doxazosin (CARDURA) 2 MG tablet TAKE (1) TABLET BY MOUTH ONCE DAILY.  Marland Kitchen escitalopram (LEXAPRO) 20 MG tablet TAKE (1) TABLET BY MOUTH ONCE DAILY.  . feeding supplement (ENSURE ENLIVE / ENSURE PLUS) LIQD Take 237 mLs by mouth daily in the afternoon.  Marland Kitchen lisinopril (ZESTRIL) 5 MG tablet Take 10 mg by mouth daily.  . meloxicam (MOBIC) 7.5 MG tablet TAKE (1) TABLET BY MOUTH ONCE DAILY.  . NON FORMULARY Diet: Pureed, NAS Diet ALLERGIC TO SHELLFISH - ANAPHYLACTIC REACTION!!!  . [DISCONTINUED] meclizine (ANTIVERT) 25 MG tablet Take one tablet tid prn dizziness   No facility-administered encounter medications on file as of 12/02/2020.     SIGNIFICANT DIAGNOSTIC EXAMS   PREVIOUS   11-16-20: ct of head and cervical spine:  1. Multifocal subarachnoid hemorrhage correlating with trauma history. 2. ~3 cm superior right frontal parenchymal hemorrhage, presumed hemorrhagic contusion in this setting. 3. Age advanced brain atrophy. 4. Negative for cervical spine fracture.  11-16-20: pelvic x-ray:  1. No acute fracture or malalignment. 2. The bones are diffusely demineralized suggesting underlying osteoporosis. This limits the sensitivity for detection of nondisplaced fractures by conventional x-ray. 3. Moderate amount of formed stool within the rectum concerning for constipation/impaction.  11-20-20: ct of head:  1. Multifocal intracranial hemorrhage as detailed above with mildly decreased size of the dominant right  frontal hemorrhage. No midline shift. 2. New small volume intraventricular hemorrhage. 3. Mildly increased size of low-density subdural fluid collection over the left cerebral convexity, possibly a traumatic hygroma. 4. Unchanged subarachnoid hemorrhage.  NO NEW EXAMS.    LABS REVIEWED PREVIOUS   11-16-20: wbc 6.7; hgb 13.9; hct 43.1; mcv 90.2 plt 401; glucose 100; bun 15; creat 0.45; k+ 3.7; na++ 142; ca 8.7; GFR>60; liver normal albumin 3.5   TODAY  11-28-20: wbc 9.7; hgb 13.6; hct 42.9; mcv 91.3 plt 625; glucose 91; bun 20; creat 0.38; k+ 3.9; na++ 142; ca 8.7 GFR>60; liver normal albumin 2.8    Review of Systems  Unable to perform ROS: Dementia (nonverbal )  Physical Exam Constitutional:      General: She is not in acute distress.    Appearance: She is well-developed. She is not diaphoretic.     Comments: Is aware   Neck:     Thyroid: No thyromegaly.  Cardiovascular:     Rate and Rhythm: Normal rate and regular rhythm.     Pulses: Normal pulses.     Heart sounds: Murmur heard.      Comments: 2/6 Pulmonary:     Effort: Pulmonary effort is normal. No respiratory distress.     Breath sounds: Normal breath sounds.  Abdominal:     General: Bowel sounds are normal. There is no distension.     Palpations: Abdomen is soft.     Tenderness: There is no abdominal tenderness.  Musculoskeletal:     Cervical back: Neck supple.     Right lower leg: No edema.     Left lower leg: No edema.     Comments: Has edema to left hand Left side flaccid Is able to move right extremities.  Neck is hyperextended  History of cervical and lumbar surgeries.    Lymphadenopathy:     Cervical: No cervical adenopathy.  Skin:    General: Skin is warm and dry.  Neurological:     Comments:  Is status post right frontal lobe hemorrhage: 3.5 x 4.8 x 3.7 cm     Psychiatric:        Mood and Affect: Mood normal.      ASSESSMENT/ PLAN:  TODAY  1. Essential hypertension benign is worse: b/p 200/78  will continue cardizem cd 300 mg daily; lisinopril 10 mg daily will give clonidine 0.1 mg now; will increase doxazosin to 4 mg daily will monitor her status.      Synthia Innocent NP [facility]  Contact [phone] Monday through Friday 8am- 5pm  After hours call [phone]

## 2020-12-05 ENCOUNTER — Other Ambulatory Visit (HOSPITAL_COMMUNITY)
Admission: RE | Admit: 2020-12-05 | Discharge: 2020-12-05 | Disposition: A | Discharge status: Home / Self Care | Payer: Medicare Other | Source: Skilled Nursing Facility | Attending: Adult Health | Admitting: Adult Health

## 2020-12-05 DIAGNOSIS — I10 Essential (primary) hypertension: Secondary | ICD-10-CM | POA: Diagnosis present

## 2020-12-05 LAB — BASIC METABOLIC PANEL
Anion gap: 8 (ref 5–15)
BUN: 24 mg/dL — ABNORMAL HIGH (ref 8–23)
CO2: 26 mmol/L (ref 22–32)
Calcium: 9.3 mg/dL (ref 8.9–10.3)
Chloride: 114 mmol/L — ABNORMAL HIGH (ref 98–111)
Creatinine, Ser: 0.32 mg/dL — ABNORMAL LOW (ref 0.44–1.00)
GFR, Estimated: >60 mL/min (ref >60)
Glucose, Bld: 85 mg/dL (ref 70–99)
Potassium: 4 mmol/L (ref 3.5–5.1)
Sodium: 148 mmol/L — ABNORMAL HIGH (ref 135–145)

## 2020-12-06 ENCOUNTER — Encounter: Payer: Self-pay | Admitting: Adult Health

## 2020-12-06 ENCOUNTER — Non-Acute Institutional Stay (SKILLED_NURSING_FACILITY): Payer: Medicare Other | Admitting: Adult Health

## 2020-12-06 DIAGNOSIS — I10 Essential (primary) hypertension: Secondary | ICD-10-CM | POA: Diagnosis not present

## 2020-12-06 DIAGNOSIS — E871 Hypo-osmolality and hyponatremia: Secondary | ICD-10-CM

## 2020-12-06 NOTE — Progress Notes (Signed)
Location:  [facility] Room Number: 151/P Place of Service:  SNF (31)   CODE STATUS: DNR  Allergies  Allergen Reactions  . Iohexol Anaphylaxis and Shortness Of Breath    PT STATES SHE WENT INTO ANAPHYLACTIC SHOCK 68YRS AGO AFTER CT WITH CONTRAST AT [place]:Onset Date: 10/10/2009   . Shellfish Allergy Anaphylaxis  . Morphine And Related Other (See Comments)    Unknown   . Penicillins Rash    Can tolerate cephalosporins    Chief Complaint  Patient presents with  . Follow-up    Lab Follow Up    HPI:  Her na++ is slightly elevated at 148. Her fluid intake is somewhat poor. Her blood pressure readings remain elevated. There are no indications of aspiration. No indications of pain present. No reports of anxiety present.   Past Medical History:  Diagnosis Date  . Ankle fracture, left 06/08/2014  . Anxiety   . Asthma   . Colon polyp   . COPD (chronic obstructive pulmonary disease) (HCC)   . Dementia (HCC)   . Depression   . Diverticular disease   . Hyperlipidemia   . Hypertension   . IFG (impaired fasting glucose)   . Lumbar burst fracture (HCC) 06/14/2014  . Memory loss   . Mild sleep apnea   . Multiple fractures of left foot 06/08/2014    Past Surgical History:  Procedure Laterality Date  . ABDOMINAL HYSTERECTOMY    . ABDOMINAL SURGERY    . ANTERIOR LAT LUMBAR FUSION Left 06/22/2014   Procedure: Left Lumbar one Corpectomy with Fusion Thoracic twelve-Lumbar two,lateral plating,posterior percutaneous pedicle screws;  Surgeon: Temple Pacini, MD;  Location: [facility];  Service: Neurosurgery;  Laterality: Left;  . APPENDECTOMY    . BACK SURGERY    . CERVICAL FUSION  October 2007  . LUMBAR PERCUTANEOUS PEDICLE SCREW 1 LEVEL N/A 06/22/2014   Procedure: LUMBAR PERCUTANEOUS PEDICLE SCREW 1 LEVEL;  Surgeon: Temple Pacini, MD;  Location: [facility];  Service: Neurosurgery;  Laterality: N/A;  . NECK SURGERY    . ORIF TIBIA FRACTURE Left 06/04/2014    Procedure: OPEN REDUCTION INTERNAL FIXATION (ORIF) PILON FRACTURE AND OPEN REDUCTION INTERNAL FIXATION (ORIF) OF SYNDESMOTIC FRACTURE;  Surgeon: Eldred Manges, MD;  Location: [facility];  Service: Orthopedics;  Laterality: Left;  Big C-arm, Flat Jackson, Biomet Anterolateral plates, cancellous bone graft 20cc. Surgeon will call. Available after 5pm  . ORIF TIBIA FRACTURE Left 06/14/2014   DR Gerlene Fee  . SIGMOIDECTOMY    . TONSILLECTOMY      Social History   Socioeconomic History  . Marital status: Married    Spouse name: [name]  . Number of children: 3  . Years of education: GED  . Highest education level: Not on file  Occupational History    Comment: Retired  Tobacco Use  . Smoking status: Former Smoker    Packs/day: 0.50    Years: 32.00    Pack years: 16.00    Types: Cigarettes    Start date: 04/11/1962    Quit date: 03/10/1994    Years since quitting: 26.7  . Smokeless tobacco: Never Used  Vaping Use  . Vaping Use: Never used  Substance and Sexual Activity  . Alcohol use: No    Alcohol/week: 0.0 standard drinks  . Drug use: No  . Sexual activity: Never  Other Topics Concern  . Not on file  Social History Narrative   Patient lives at home with her husband [name]).  Patient is retired.    GED.   Caffeine- coffee- 1 cup daily   Right handed.   Social Determinants of Health   Financial Resource Strain: Not on file  Food Insecurity: Not on file  Transportation Needs: Not on file  Physical Activity: Not on file  Stress: Not on file  Social Connections: Not on file  Intimate Partner Violence: Not on file   Family History  Problem Relation Age of Onset  . Hypertension Father   . Diabetes Father   . Heart attack Father       VITAL SIGNS BP (!) 184/72   Pulse (!) 55   Temp 97.6 F (36.4 C)   Resp 18   Ht 5' (1.524 m)   Wt 120 lb 6.4 oz (54.6 kg)   SpO2 96%   BMI 23.51 kg/m   Outpatient Encounter Medications as of 12/06/2020  Medication Sig  . acetaminophen  (TYLENOL) 500 MG tablet Take 1 tablet (500 mg total) by mouth every 6 (six) hours as needed for mild pain or fever. For thoracic fracture.  Marland Kitchen albuterol (PROAIR HFA) 108 (90 Base) MCG/ACT inhaler Inhale 2 puffs into the lungs every 6 (six) hours as needed. for wheezing  . Balsam [place]-Castor Oil (VENELEX) OINT Apply topically. Special Instructions: Apply to sacrum, bilateral buttocks & coccyx qshift for prevention.  Marland Kitchen diltiazem (CARDIZEM CD) 300 MG 24 hr capsule Take 1 capsule (300 mg total) by mouth daily.  Marland Kitchen donepezil (ARICEPT) 10 MG tablet TAKE (1) TABLET BY MOUTH EACH MORNING.  Marland Kitchen doxazosin (CARDURA) 2 MG tablet TAKE (1) TABLET BY MOUTH ONCE DAILY.  Marland Kitchen escitalopram (LEXAPRO) 20 MG tablet TAKE (1) TABLET BY MOUTH ONCE DAILY.  . feeding supplement (ENSURE ENLIVE / ENSURE PLUS) LIQD Take 237 mLs by mouth daily in the afternoon.  Marland Kitchen lisinopril (ZESTRIL) 5 MG tablet Take 10 mg by mouth daily.  . meloxicam (MOBIC) 7.5 MG tablet TAKE (1) TABLET BY MOUTH ONCE DAILY.  . NON FORMULARY Diet: Pureed, NAS Diet ALLERGIC TO SHELLFISH - ANAPHYLACTIC REACTION!!!  . [DISCONTINUED] buPROPion (WELLBUTRIN XL) 150 MG 24 hr tablet Take one tablet po daily   No facility-administered encounter medications on file as of 12/06/2020.     SIGNIFICANT DIAGNOSTIC EXAMS  PREVIOUS   11-16-20: ct of head and cervical spine:  1. Multifocal subarachnoid hemorrhage correlating with trauma history. 2. ~3 cm superior right frontal parenchymal hemorrhage, presumed hemorrhagic contusion in this setting. 3. Age advanced brain atrophy. 4. Negative for cervical spine fracture.  11-16-20: pelvic x-ray:  1. No acute fracture or malalignment. 2. The bones are diffusely demineralized suggesting underlying osteoporosis. This limits the sensitivity for detection of nondisplaced fractures by conventional x-ray. 3. Moderate amount of formed stool within the rectum concerning for constipation/impaction.  11-20-20: ct of head:  1. Multifocal  intracranial hemorrhage as detailed above with mildly decreased size of the dominant right frontal hemorrhage. No midline shift. 2. New small volume intraventricular hemorrhage. 3. Mildly increased size of low-density subdural fluid collection over the left cerebral convexity, possibly a traumatic hygroma. 4. Unchanged subarachnoid hemorrhage.  NO NEW EXAMS.    LABS REVIEWED PREVIOUS   11-16-20: wbc 6.7; hgb 13.9; hct 43.1; mcv 90.2 plt 401; glucose 100; bun 15; creat 0.45; k+ 3.7; na++ 142; ca 8.7; GFR>60; liver normal albumin 3.5  11-28-20: wbc 9.7; hgb 13.6; hct 42.9; mcv 91.3 plt 625; glucose 91; bun 20; creat 0.38; k+ 3.9; na++ 142; ca 8.7 GFR>60; liver normal albumin 2.8  TODAY  12-05-20: glucose 95; bun 24; creat 0.32; k+ 4.0; na++ 148; ca 9.0 GFR>60   Review of Systems  Unable to perform ROS: Dementia (unable to participate )    Physical Exam Constitutional:      General: She is not in acute distress.    Appearance: She is well-developed. She is not diaphoretic.     Comments: Is aware   Neck:     Thyroid: No thyromegaly.  Cardiovascular:     Rate and Rhythm: Normal rate and regular rhythm.     Pulses: Normal pulses.     Heart sounds: Murmur heard.      Comments: 2/6 Pulmonary:     Effort: Pulmonary effort is normal. No respiratory distress.     Breath sounds: Normal breath sounds.  Abdominal:     General: Bowel sounds are normal. There is no distension.     Palpations: Abdomen is soft.     Tenderness: There is no abdominal tenderness.  Musculoskeletal:     Cervical back: Neck supple.     Right lower leg: No edema.     Left lower leg: No edema.     Comments: Has edema to left hand Left side flaccid Is able to move right extremities.  Neck is hyperextended  History of cervical and lumbar surgeries.     Lymphadenopathy:     Cervical: No cervical adenopathy.  Skin:    General: Skin is warm and dry.  Neurological:     Comments: Is status post right frontal lobe  hemorrhage: 3.5 x 4.8 x 3.7 cm      Psychiatric:        Mood and Affect: Mood normal.       ASSESSMENT/ PLAN:  TODAY  1. Essential hypertension benign: is not under control b/p 184/72  Will continue cardizem cd 300 mg daily; doxasin 4 mg daily will increase lisinopril to 20 mg daily   2. Hypernatremia: is worse at 148. Will have staff give her an extra glass of water three times daily   Will check BMP on 12-16-20.       Synthia Innocent NP [facility]  Contact [phone] Monday through Friday 8am- 5pm  After hours call [phone]

## 2020-12-09 DIAGNOSIS — E871 Hypo-osmolality and hyponatremia: Secondary | ICD-10-CM | POA: Insufficient documentation

## 2020-12-10 ENCOUNTER — Non-Acute Institutional Stay (SKILLED_NURSING_FACILITY): Payer: Medicare Other | Admitting: Adult Health

## 2020-12-10 ENCOUNTER — Encounter: Payer: Self-pay | Admitting: Adult Health

## 2020-12-10 DIAGNOSIS — G309 Alzheimer's disease, unspecified: Secondary | ICD-10-CM | POA: Diagnosis not present

## 2020-12-10 DIAGNOSIS — R634 Abnormal weight loss: Secondary | ICD-10-CM

## 2020-12-10 DIAGNOSIS — I10 Essential (primary) hypertension: Secondary | ICD-10-CM

## 2020-12-10 DIAGNOSIS — F0280 Dementia in other diseases classified elsewhere without behavioral disturbance: Secondary | ICD-10-CM

## 2020-12-10 DIAGNOSIS — S06349S Traumatic hemorrhage of right cerebrum with loss of consciousness of unspecified duration, sequela: Secondary | ICD-10-CM | POA: Diagnosis not present

## 2020-12-10 NOTE — Progress Notes (Signed)
Location:  [facility] Room Number: 151/P Place of Service:  SNF (31)   CODE STATUS: DNR  Allergies  Allergen Reactions  . Iohexol Anaphylaxis and Shortness Of Breath    PT STATES SHE WENT INTO ANAPHYLACTIC SHOCK 69YRS AGO AFTER CT WITH CONTRAST AT [place]:Onset Date: 10/10/2009   . Shellfish Allergy Anaphylaxis  . Morphine And Related Other (See Comments)    Unknown   . Penicillins Rash    Can tolerate cephalosporins    Chief Complaint  Patient presents with  . Acute Visit    Hypertension     HPI:  Her blood this morning was 220/70 (manuel pressure). She is presently taking cardizem cd 300 mg daily; lisinopril 20 mg daily for her blood pressure. Her cardizem cannot be crushed.  She is losing weight from 128.7 pounds on 11-29-20 to her weight os 120.4 pounds on 12-06-20. Her po intake is poor. She is on a pureed diet with thin liquids.  There are no indications of pain today. No reports of anxiety present. She does get out of bed.    Past Medical History:  Diagnosis Date  . Ankle fracture, left 06/08/2014  . Anxiety   . Asthma   . Colon polyp   . COPD (chronic obstructive pulmonary disease) (HCC)   . Dementia (HCC)   . Depression   . Diverticular disease   . Hyperlipidemia   . Hypertension   . IFG (impaired fasting glucose)   . Lumbar burst fracture (HCC) 06/14/2014  . Memory loss   . Mild sleep apnea   . Multiple fractures of left foot 06/08/2014    Past Surgical History:  Procedure Laterality Date  . ABDOMINAL HYSTERECTOMY    . ABDOMINAL SURGERY    . ANTERIOR LAT LUMBAR FUSION Left 06/22/2014   Procedure: Left Lumbar one Corpectomy with Fusion Thoracic twelve-Lumbar two,lateral plating,posterior percutaneous pedicle screws;  Surgeon: Temple Pacini, MD;  Location: [facility];  Service: Neurosurgery;  Laterality: Left;  . APPENDECTOMY    . BACK SURGERY    . CERVICAL FUSION  October 2007  . LUMBAR PERCUTANEOUS PEDICLE SCREW 1 LEVEL N/A  06/22/2014   Procedure: LUMBAR PERCUTANEOUS PEDICLE SCREW 1 LEVEL;  Surgeon: Temple Pacini, MD;  Location: [facility];  Service: Neurosurgery;  Laterality: N/A;  . NECK SURGERY    . ORIF TIBIA FRACTURE Left 06/04/2014   Procedure: OPEN REDUCTION INTERNAL FIXATION (ORIF) PILON FRACTURE AND OPEN REDUCTION INTERNAL FIXATION (ORIF) OF SYNDESMOTIC FRACTURE;  Surgeon: Eldred Manges, MD;  Location: [facility];  Service: Orthopedics;  Laterality: Left;  Big C-arm, Flat Jackson, Biomet Anterolateral plates, cancellous bone graft 20cc. Surgeon will call. Available after 5pm  . ORIF TIBIA FRACTURE Left 06/14/2014   DR Gerlene Fee  . SIGMOIDECTOMY    . TONSILLECTOMY      Social History   Socioeconomic History  . Marital status: Married    Spouse name: [name]  . Number of children: 3  . Years of education: GED  . Highest education level: Not on file  Occupational History    Comment: Retired  Tobacco Use  . Smoking status: Former Smoker    Packs/day: 0.50    Years: 32.00    Pack years: 16.00    Types: Cigarettes    Start date: 04/11/1962    Quit date: 03/10/1994    Years since quitting: 26.7  . Smokeless tobacco: Never Used  Vaping Use  . Vaping Use: Never used  Substance and  Sexual Activity  . Alcohol use: No    Alcohol/week: 0.0 standard drinks  . Drug use: No  . Sexual activity: Never  Other Topics Concern  . Not on file  Social History Narrative   Patient lives at home with her husband [name]). Patient is retired.    GED.   Caffeine- coffee- 1 cup daily   Right handed.   Social Determinants of Health   Financial Resource Strain: Not on file  Food Insecurity: Not on file  Transportation Needs: Not on file  Physical Activity: Not on file  Stress: Not on file  Social Connections: Not on file  Intimate Partner Violence: Not on file   Family History  Problem Relation Age of Onset  . Hypertension Father   . Diabetes Father   . Heart attack Father       VITAL SIGNS BP (!) 154/70    Pulse (!) 54   Temp 97.8 F (36.6 C)   Resp 20   Ht 5' (1.524 m)   Wt 120 lb 6.4 oz (54.6 kg)   SpO2 94%   BMI 23.51 kg/m   Outpatient Encounter Medications as of 12/10/2020  Medication Sig  . acetaminophen (TYLENOL) 500 MG tablet Take 1 tablet (500 mg total) by mouth every 6 (six) hours as needed for mild pain or fever. For thoracic fracture.  Marland Kitchen albuterol (PROAIR HFA) 108 (90 Base) MCG/ACT inhaler Inhale 2 puffs into the lungs every 6 (six) hours as needed. for wheezing  . amLODipine (NORVASC) 5 MG tablet Take 5 mg by mouth daily.  Lucilla Lame [place]-Castor Oil (VENELEX) OINT Apply topically. Special Instructions: Apply to sacrum, bilateral buttocks & coccyx qshift for prevention.  Marland Kitchen donepezil (ARICEPT) 5 MG tablet Take 5 mg by mouth at bedtime.  Marland Kitchen doxazosin (CARDURA) 2 MG tablet TAKE (1) TABLET BY MOUTH ONCE DAILY.  Marland Kitchen escitalopram (LEXAPRO) 20 MG tablet TAKE (1) TABLET BY MOUTH ONCE DAILY.  . feeding supplement (ENSURE ENLIVE / ENSURE PLUS) LIQD Take 237 mLs by mouth daily in the afternoon.  Marland Kitchen lisinopril (ZESTRIL) 20 MG tablet Take 20 mg by mouth daily.  . meloxicam (MOBIC) 7.5 MG tablet TAKE (1) TABLET BY MOUTH ONCE DAILY.  . NON FORMULARY Diet: Pureed, NAS Diet ALLERGIC TO SHELLFISH - ANAPHYLACTIC REACTION!!!  . [DISCONTINUED] diltiazem (CARDIZEM CD) 300 MG 24 hr capsule Take 1 capsule (300 mg total) by mouth daily.  . [DISCONTINUED] donepezil (ARICEPT) 10 MG tablet TAKE (1) TABLET BY MOUTH EACH MORNING.  . [DISCONTINUED] lisinopril (ZESTRIL) 5 MG tablet Take 10 mg by mouth daily.   No facility-administered encounter medications on file as of 12/10/2020.     SIGNIFICANT DIAGNOSTIC EXAMS   PREVIOUS   11-16-20: ct of head and cervical spine:  1. Multifocal subarachnoid hemorrhage correlating with trauma history. 2. ~3 cm superior right frontal parenchymal hemorrhage, presumed hemorrhagic contusion in this setting. 3. Age advanced brain atrophy. 4. Negative for cervical spine  fracture.  11-16-20: pelvic x-ray:  1. No acute fracture or malalignment. 2. The bones are diffusely demineralized suggesting underlying osteoporosis. This limits the sensitivity for detection of nondisplaced fractures by conventional x-ray. 3. Moderate amount of formed stool within the rectum concerning for constipation/impaction.  11-20-20: ct of head:  1. Multifocal intracranial hemorrhage as detailed above with mildly decreased size of the dominant right frontal hemorrhage. No midline shift. 2. New small volume intraventricular hemorrhage. 3. Mildly increased size of low-density subdural fluid collection over the left cerebral convexity, possibly a traumatic hygroma.  4. Unchanged subarachnoid hemorrhage.  NO NEW EXAMS.    LABS REVIEWED PREVIOUS   11-16-20: wbc 6.7; hgb 13.9; hct 43.1; mcv 90.2 plt 401; glucose 100; bun 15; creat 0.45; k+ 3.7; na++ 142; ca 8.7; GFR>60; liver normal albumin 3.5  11-28-20: wbc 9.7; hgb 13.6; hct 42.9; mcv 91.3 plt 625; glucose 91; bun 20; creat 0.38; k+ 3.9; na++ 142; ca 8.7 GFR>60; liver normal albumin 2.8   12-05-20: glucose 95; bun 24; creat 0.32; k+ 4.0; na++ 148; ca 9.0 GFR>60  NO NEW LABS.   Review of Systems  Unable to perform ROS: Dementia (unable to participate )    Physical Exam Constitutional:      General: She is not in acute distress.    Appearance: She is well-developed. She is not diaphoretic.     Comments: Is aware  Neck:     Thyroid: No thyromegaly.  Cardiovascular:     Rate and Rhythm: Regular rhythm. Bradycardia present.     Pulses: Normal pulses.     Heart sounds: Murmur heard.      Comments: 2/6 Pulmonary:     Effort: Pulmonary effort is normal. No respiratory distress.     Breath sounds: Normal breath sounds.  Abdominal:     General: Bowel sounds are normal. There is no distension.     Palpations: Abdomen is soft.     Tenderness: There is no abdominal tenderness.  Musculoskeletal:     Cervical back: Neck supple.      Right lower leg: No edema.     Left lower leg: No edema.     Comments: Has edema to left hand Left side flaccid Is able to move right extremities.  Neck is hyperextended  History of cervical and lumbar surgeries  Lymphadenopathy:     Cervical: No cervical adenopathy.  Skin:    General: Skin is warm and dry.  Neurological:     Comments: Is status post right frontal lobe hemorrhage: 3.5 x 4.8 x 3.7 cm       Psychiatric:        Mood and Affect: Mood normal.       ASSESSMENT/ PLAN:  TODAY  1. Weight loss non-intentional 2. Essential hypertension benign 3. Alzheimer's dementia 4. Traumatic hemorrhage of right cerebrum with loss of consciousness sequela  Will stop cardizem as this medication cannot be crushed Will begin norvasc 5 mg daily at noon Will setup renal ultrasound Due to her weight loss:  Will lower aricept to 5 mg daily  Will increase ensure to twice daily  Will monitor her status.     Synthia Innocent NP [facility]  Contact [phone] Monday through Friday 8am- 5pm  After hours call [phone]

## 2020-12-11 DIAGNOSIS — R634 Abnormal weight loss: Secondary | ICD-10-CM | POA: Insufficient documentation

## 2020-12-15 DEATH — deceased

## 2020-12-16 ENCOUNTER — Ambulatory Visit: Payer: Medicare Other | Admitting: Family Medicine

## 2020-12-16 ENCOUNTER — Other Ambulatory Visit (HOSPITAL_COMMUNITY)
Admission: RE | Admit: 2020-12-16 | Discharge: 2020-12-16 | Disposition: A | Discharge status: Home / Self Care | Payer: Medicare Other | Source: Skilled Nursing Facility | Attending: Adult Health | Admitting: Adult Health

## 2020-12-16 DIAGNOSIS — I10 Essential (primary) hypertension: Secondary | ICD-10-CM | POA: Diagnosis present

## 2020-12-16 LAB — BASIC METABOLIC PANEL
Anion gap: 5 (ref 5–15)
BUN: 22 mg/dL (ref 8–23)
CO2: 29 mmol/L (ref 22–32)
Calcium: 9.1 mg/dL (ref 8.9–10.3)
Chloride: 115 mmol/L — ABNORMAL HIGH (ref 98–111)
Creatinine, Ser: 0.35 mg/dL — ABNORMAL LOW (ref 0.44–1.00)
GFR, Estimated: >60 mL/min (ref >60)
Glucose, Bld: 97 mg/dL (ref 70–99)
Potassium: 3.5 mmol/L (ref 3.5–5.1)
Sodium: 149 mmol/L — ABNORMAL HIGH (ref 135–145)

## 2020-12-19 ENCOUNTER — Encounter: Payer: Self-pay | Admitting: Adult Health

## 2020-12-19 ENCOUNTER — Non-Acute Institutional Stay (SKILLED_NURSING_FACILITY): Payer: Medicare Other | Admitting: Adult Health

## 2020-12-19 DIAGNOSIS — J449 Chronic obstructive pulmonary disease, unspecified: Secondary | ICD-10-CM | POA: Diagnosis not present

## 2020-12-19 DIAGNOSIS — F339 Major depressive disorder, recurrent, unspecified: Secondary | ICD-10-CM | POA: Diagnosis not present

## 2020-12-19 DIAGNOSIS — G8194 Hemiplegia, unspecified affecting left nondominant side: Secondary | ICD-10-CM

## 2020-12-19 DIAGNOSIS — S06349S Traumatic hemorrhage of right cerebrum with loss of consciousness of unspecified duration, sequela: Secondary | ICD-10-CM | POA: Diagnosis not present

## 2020-12-19 NOTE — Progress Notes (Signed)
Location:  [facility] Room Number: 127 Place of Service:  SNF (31)    CODE STATUS: dnr   Allergies  Allergen Reactions  . Iohexol Anaphylaxis and Shortness Of Breath    PT STATES SHE WENT INTO ANAPHYLACTIC SHOCK 55YRS AGO AFTER CT WITH CONTRAST AT [place]:Onset Date: 10/10/2009   . Shellfish Allergy Anaphylaxis  . Morphine And Related Other (See Comments)    Unknown   . Penicillins Rash    Can tolerate cephalosporins    Chief Complaint  Patient presents with  . Acute Visit    Care plan meeting.     HPI:  We have come together for her care plan meeting. Family present  no BIMS; mood: 0/30.   She is dependent with her adls. She is incontinent of bladder and bowel. There have been no falls. Therapy has been d/c from pt; is on ot and st. Splint has been ordered for her left hand.    Weight:  110.8 pounds; diet: puree diet; she is fed by staff appetite is poor to fair. She is taking supplements. She is slowly losing weight. She continues to be followed for her chronic illnesses including:  Left hemiplegia  Traumatic hemorrhage of right cerebrum with loss of consciousness  Major depression recurrent chronic   Chronic obstructive pulmonary disease unspecified COPD   We have discussed her MOST form advanced directives: she is to remain DNR; hospitalization without intubation; is ok for IVF and ABT; no tube feeding. Family has verbalized understanding and is in agreement with plan of care.   Past Medical History:  Diagnosis Date  . Ankle fracture, left 06/08/2014  . Anxiety   . Asthma   . Colon polyp   . COPD (chronic obstructive pulmonary disease) (HCC)   . Dementia (HCC)   . Depression   . Diverticular disease   . Hyperlipidemia   . Hypertension   . IFG (impaired fasting glucose)   . Lumbar burst fracture (HCC) 06/14/2014  . Memory loss   . Mild sleep apnea   . Multiple fractures of left foot 06/08/2014    Past Surgical History:  Procedure  Laterality Date  . ABDOMINAL HYSTERECTOMY    . ABDOMINAL SURGERY    . ANTERIOR LAT LUMBAR FUSION Left 06/22/2014   Procedure: Left Lumbar one Corpectomy with Fusion Thoracic twelve-Lumbar two,lateral plating,posterior percutaneous pedicle screws;  Surgeon: Temple Pacini, MD;  Location: [facility];  Service: Neurosurgery;  Laterality: Left;  . APPENDECTOMY    . BACK SURGERY    . CERVICAL FUSION  October 2007  . LUMBAR PERCUTANEOUS PEDICLE SCREW 1 LEVEL N/A 06/22/2014   Procedure: LUMBAR PERCUTANEOUS PEDICLE SCREW 1 LEVEL;  Surgeon: Temple Pacini, MD;  Location: [facility];  Service: Neurosurgery;  Laterality: N/A;  . NECK SURGERY    . ORIF TIBIA FRACTURE Left 06/04/2014   Procedure: OPEN REDUCTION INTERNAL FIXATION (ORIF) PILON FRACTURE AND OPEN REDUCTION INTERNAL FIXATION (ORIF) OF SYNDESMOTIC FRACTURE;  Surgeon: Eldred Manges, MD;  Location: [facility] OR;  Service: Orthopedics;  Laterality: Left;  Big C-arm, Flat Jackson, Biomet Anterolateral plates, cancellous bone graft 20cc. Surgeon will call. Available after 5pm  . ORIF TIBIA FRACTURE Left 06/14/2014   DR Gerlene Fee  . SIGMOIDECTOMY    . TONSILLECTOMY      Social History   Socioeconomic History  . Marital status: Married    Spouse name: [name]  . Number of children: 3  . Years of education: GED  .  Highest education level: Not on file  Occupational History    Comment: Retired  Tobacco Use  . Smoking status: Former Smoker    Packs/day: 0.50    Years: 32.00    Pack years: 16.00    Types: Cigarettes    Start date: 04/11/1962    Quit date: 03/10/1994    Years since quitting: 26.7  . Smokeless tobacco: Never Used  Vaping Use  . Vaping Use: Never used  Substance and Sexual Activity  . Alcohol use: No    Alcohol/week: 0.0 standard drinks  . Drug use: No  . Sexual activity: Never  Other Topics Concern  . Not on file  Social History Narrative   Patient lives at home with her husband [name]). Patient is retired.    GED.   Caffeine-  coffee- 1 cup daily   Right handed.   Social Determinants of Health   Financial Resource Strain: Not on file  Food Insecurity: Not on file  Transportation Needs: Not on file  Physical Activity: Not on file  Stress: Not on file  Social Connections: Not on file  Intimate Partner Violence: Not on file   Family History  Problem Relation Age of Onset  . Hypertension Father   . Diabetes Father   . Heart attack Father       VITAL SIGNS BP 125/74   Pulse 76   Temp 97.9 F (36.6 C)   Resp 18   Ht 5' (1.524 m)   Wt 120 lb (54.4 kg)   BMI 23.44 kg/m   Outpatient Encounter Medications as of 12/19/2020  Medication Sig  . acetaminophen (TYLENOL) 500 MG tablet Take 1 tablet (500 mg total) by mouth every 6 (six) hours as needed for mild pain or fever. For thoracic fracture.  Marland Kitchen albuterol (PROAIR HFA) 108 (90 Base) MCG/ACT inhaler Inhale 2 puffs into the lungs every 6 (six) hours as needed. for wheezing  . amLODipine (NORVASC) 5 MG tablet Take 5 mg by mouth daily.  Lucilla Lame [place]-Castor Oil (VENELEX) OINT Apply topically. Special Instructions: Apply to sacrum, bilateral buttocks & coccyx qshift for prevention.  Marland Kitchen donepezil (ARICEPT) 5 MG tablet Take 5 mg by mouth at bedtime.  Marland Kitchen doxazosin (CARDURA) 2 MG tablet TAKE (1) TABLET BY MOUTH ONCE DAILY.  Marland Kitchen escitalopram (LEXAPRO) 20 MG tablet TAKE (1) TABLET BY MOUTH ONCE DAILY.  . feeding supplement (ENSURE ENLIVE / ENSURE PLUS) LIQD Take 237 mLs by mouth 2 (two) times daily between meals.  Marland Kitchen lisinopril (ZESTRIL) 20 MG tablet Take 20 mg by mouth daily.  . meloxicam (MOBIC) 7.5 MG tablet TAKE (1) TABLET BY MOUTH ONCE DAILY.  . NON FORMULARY Diet: Pureed, NAS Diet ALLERGIC TO SHELLFISH - ANAPHYLACTIC REACTION!!!   No facility-administered encounter medications on file as of 12/19/2020.     SIGNIFICANT DIAGNOSTIC EXAMS  PREVIOUS   11-16-20: ct of head and cervical spine:  1. Multifocal subarachnoid hemorrhage correlating with trauma history. 2.  ~3 cm superior right frontal parenchymal hemorrhage, presumed hemorrhagic contusion in this setting. 3. Age advanced brain atrophy. 4. Negative for cervical spine fracture.  11-16-20: pelvic x-ray:  1. No acute fracture or malalignment. 2. The bones are diffusely demineralized suggesting underlying osteoporosis. This limits the sensitivity for detection of nondisplaced fractures by conventional x-ray. 3. Moderate amount of formed stool within the rectum concerning for constipation/impaction.  11-20-20: ct of head:  1. Multifocal intracranial hemorrhage as detailed above with mildly decreased size of the dominant right frontal hemorrhage. No midline  shift. 2. New small volume intraventricular hemorrhage. 3. Mildly increased size of low-density subdural fluid collection over the left cerebral convexity, possibly a traumatic hygroma. 4. Unchanged subarachnoid hemorrhage.  NO NEW EXAMS.    LABS REVIEWED PREVIOUS   11-16-20: wbc 6.7; hgb 13.9; hct 43.1; mcv 90.2 plt 401; glucose 100; bun 15; creat 0.45; k+ 3.7; na++ 142; ca 8.7; GFR>60; liver normal albumin 3.5  11-28-20: wbc 9.7; hgb 13.6; hct 42.9; mcv 91.3 plt 625; glucose 91; bun 20; creat 0.38; k+ 3.9; na++ 142; ca 8.7 GFR>60; liver normal albumin 2.8   12-05-20: glucose 95; bun 24; creat 0.32; k+ 4.0; na++ 148; ca 9.0 GFR>60  NO NEW LABS.   Review of Systems  Unable to perform ROS: Dementia (unable to participate )    Physical Exam Constitutional:      General: She is not in acute distress.    Appearance: She is well-developed. She is not diaphoretic.  Neck:     Thyroid: No thyromegaly.  Cardiovascular:     Rate and Rhythm: Normal rate and regular rhythm.     Pulses: Normal pulses.     Heart sounds: Murmur heard.      Comments: 2/6 Pulmonary:     Effort: Pulmonary effort is normal. No respiratory distress.     Breath sounds: Normal breath sounds.  Abdominal:     General: Bowel sounds are normal. There is no distension.      Palpations: Abdomen is soft.     Tenderness: There is no abdominal tenderness.  Musculoskeletal:     Cervical back: Neck supple.     Right lower leg: No edema.     Left lower leg: No edema.     Comments: Has edema to left hand Left side flaccid Is able to move right extremities.  Neck is hyperextended  History of cervical and lumbar surgeries   Lymphadenopathy:     Cervical: No cervical adenopathy.  Skin:    General: Skin is warm and dry.  Neurological:     Mental Status: She is alert. Mental status is at baseline.     Comments:  Is status post right frontal lobe hemorrhage: 3.5 x 4.8 x 3.7 cm       Psychiatric:        Mood and Affect: Mood normal.     ASSESSMENT/ PLAN:  TODAY  1. Left hemiplegia  2. Traumatic hemorrhage of right cerebrum with loss of consciousness 3. Major depression recurrent chronic 4.  Chronic obstructive pulmonary disease unspecified COPD  Will stop aricept due to weight loss.  Will increase norvasc to 10 mg daily  Will continue therapy as directed Will continue current plan of care  Time spent with patient: 45 minutes: discussed therapy; plan of care. MOST Form has been filled out. Family is in agreement with plan of care.    Synthia Innocent NP [facility]  Contact [phone] Monday through Friday 8am- 5pm  After hours call [phone]

## 2020-12-24 ENCOUNTER — Non-Acute Institutional Stay (SKILLED_NURSING_FACILITY): Payer: Medicare Other | Admitting: Adult Health

## 2020-12-24 ENCOUNTER — Encounter: Payer: Self-pay | Admitting: Adult Health

## 2020-12-24 DIAGNOSIS — J449 Chronic obstructive pulmonary disease, unspecified: Secondary | ICD-10-CM

## 2020-12-24 DIAGNOSIS — G8194 Hemiplegia, unspecified affecting left nondominant side: Secondary | ICD-10-CM | POA: Diagnosis not present

## 2020-12-24 DIAGNOSIS — G309 Alzheimer's disease, unspecified: Secondary | ICD-10-CM

## 2020-12-24 DIAGNOSIS — S06349S Traumatic hemorrhage of right cerebrum with loss of consciousness of unspecified duration, sequela: Secondary | ICD-10-CM

## 2020-12-24 DIAGNOSIS — I10 Essential (primary) hypertension: Secondary | ICD-10-CM

## 2020-12-24 DIAGNOSIS — F0280 Dementia in other diseases classified elsewhere without behavioral disturbance: Secondary | ICD-10-CM

## 2020-12-24 NOTE — Progress Notes (Signed)
Location:  [facility] Room Number: [id number] Place of Service:  SNF (31)   CODE STATUS: dnr   Allergies  Allergen Reactions  . Iohexol Anaphylaxis and Shortness Of Breath    PT STATES SHE WENT INTO ANAPHYLACTIC SHOCK 41YRS AGO AFTER CT WITH CONTRAST AT [place]:Onset Date: 10/10/2009   . Shellfish Allergy Anaphylaxis  . Morphine And Related Other (See Comments)    Unknown   . Penicillins Rash    Can tolerate cephalosporins    Chief Complaint  Patient presents with  . Medical Management of Chronic Issues          Moderate dementia with behavioral disturbance:    Traumatic  hemorrhage of right cerebrum with loss of consciousness sequela: left side hemiplegia:    Chronic obstructive pulmonary disease unspecified COPD type:    Essential hypertension; benign    HPI:  She is a 75 year old long term resident of this facility being seen for the management of her chronic illnesses: Moderate dementia with behavioral disturbance:    Traumatic  hemorrhage of right cerebrum with loss of consciousness sequela: left side hemiplegia:    Chronic obstructive pulmonary disease unspecified COPD type:    Essential hypertension; benign. She continues to lose weight with her more recent at 110 pounds. There are no signs of anxiety no indications of pain present.   Past Medical History:  Diagnosis Date  . Ankle fracture, left 06/08/2014  . Anxiety   . Asthma   . Colon polyp   . COPD (chronic obstructive pulmonary disease) (HCC)   . Dementia (HCC)   . Depression   . Diverticular disease   . Hyperlipidemia   . Hypertension   . IFG (impaired fasting glucose)   . Lumbar burst fracture (HCC) 06/14/2014  . Memory loss   . Mild sleep apnea   . Multiple fractures of left foot 06/08/2014    Past Surgical History:  Procedure Laterality Date  . ABDOMINAL HYSTERECTOMY    . ABDOMINAL SURGERY    . ANTERIOR LAT LUMBAR FUSION Left 06/22/2014   Procedure: Left Lumbar one Corpectomy  with Fusion Thoracic twelve-Lumbar two,lateral plating,posterior percutaneous pedicle screws;  Surgeon: Temple Pacini, MD;  Location: [facility];  Service: Neurosurgery;  Laterality: Left;  . APPENDECTOMY    . BACK SURGERY    . CERVICAL FUSION  October 2007  . LUMBAR PERCUTANEOUS PEDICLE SCREW 1 LEVEL N/A 06/22/2014   Procedure: LUMBAR PERCUTANEOUS PEDICLE SCREW 1 LEVEL;  Surgeon: Temple Pacini, MD;  Location: [facility];  Service: Neurosurgery;  Laterality: N/A;  . NECK SURGERY    . ORIF TIBIA FRACTURE Left 06/04/2014   Procedure: OPEN REDUCTION INTERNAL FIXATION (ORIF) PILON FRACTURE AND OPEN REDUCTION INTERNAL FIXATION (ORIF) OF SYNDESMOTIC FRACTURE;  Surgeon: Eldred Manges, MD;  Location: [facility];  Service: Orthopedics;  Laterality: Left;  Big C-arm, Flat Jackson, Biomet Anterolateral plates, cancellous bone graft 20cc. Surgeon will call. Available after 5pm  . ORIF TIBIA FRACTURE Left 06/14/2014   DR Gerlene Fee  . SIGMOIDECTOMY    . TONSILLECTOMY      Social History   Socioeconomic History  . Marital status: Married    Spouse name: [name]  . Number of children: 3  . Years of education: GED  . Highest education level: Not on file  Occupational History    Comment: Retired  Tobacco Use  . Smoking status: Former Smoker    Packs/day: 0.50    Years: 32.00  Pack years: 16.00    Types: Cigarettes    Start date: 04/11/1962    Quit date: 03/10/1994    Years since quitting: 26.8  . Smokeless tobacco: Never Used  Vaping Use  . Vaping Use: Never used  Substance and Sexual Activity  . Alcohol use: No    Alcohol/week: 0.0 standard drinks  . Drug use: No  . Sexual activity: Never  Other Topics Concern  . Not on file  Social History Narrative   Patient lives at home with her husband [name]). Patient is retired.    GED.   Caffeine- coffee- 1 cup daily   Right handed.   Social Determinants of Health   Financial Resource Strain: Not on file  Food Insecurity: Not on file  Transportation  Needs: Not on file  Physical Activity: Not on file  Stress: Not on file  Social Connections: Not on file  Intimate Partner Violence: Not on file   Family History  Problem Relation Age of Onset  . Hypertension Father   . Diabetes Father   . Heart attack Father       VITAL SIGNS BP (!) 148/88   Pulse (!) 56   Temp 97.8 F (36.6 C)   Resp 20   Ht 5' (1.524 m)   Wt 110 lb 12.8 oz (50.3 kg)   SpO2 94%   BMI 21.64 kg/m   Outpatient Encounter Medications as of 12/24/2020  Medication Sig  . acetaminophen (TYLENOL) 500 MG tablet Take 1 tablet (500 mg total) by mouth every 6 (six) hours as needed for mild pain or fever. For thoracic fracture.  Marland Kitchen albuterol (PROAIR HFA) 108 (90 Base) MCG/ACT inhaler Inhale 2 puffs into the lungs every 6 (six) hours as needed. for wheezing  . amLODipine (NORVASC) 5 MG tablet Take 5 mg by mouth daily.  Lucilla Lame [place]-Castor Oil (VENELEX) OINT Apply topically. Special Instructions: Apply to sacrum, bilateral buttocks & coccyx qshift for prevention.  Marland Kitchen donepezil (ARICEPT) 5 MG tablet Take 5 mg by mouth at bedtime.  Marland Kitchen doxazosin (CARDURA) 2 MG tablet TAKE (1) TABLET BY MOUTH ONCE DAILY.  Marland Kitchen escitalopram (LEXAPRO) 20 MG tablet TAKE (1) TABLET BY MOUTH ONCE DAILY.  . feeding supplement (ENSURE ENLIVE / ENSURE PLUS) LIQD Take 237 mLs by mouth 2 (two) times daily between meals.  Marland Kitchen lisinopril (ZESTRIL) 20 MG tablet Take 20 mg by mouth daily.  . meloxicam (MOBIC) 7.5 MG tablet TAKE (1) TABLET BY MOUTH ONCE DAILY.  . NON FORMULARY Diet: Pureed, NAS Diet ALLERGIC TO SHELLFISH - ANAPHYLACTIC REACTION!!!   No facility-administered encounter medications on file as of 12/24/2020.     SIGNIFICANT DIAGNOSTIC EXAMS   PREVIOUS   11-16-20: ct of head and cervical spine:  1. Multifocal subarachnoid hemorrhage correlating with trauma history. 2. ~3 cm superior right frontal parenchymal hemorrhage, presumed hemorrhagic contusion in this setting. 3. Age advanced brain  atrophy. 4. Negative for cervical spine fracture.  11-16-20: pelvic x-ray:  1. No acute fracture or malalignment. 2. The bones are diffusely demineralized suggesting underlying osteoporosis. This limits the sensitivity for detection of nondisplaced fractures by conventional x-ray. 3. Moderate amount of formed stool within the rectum concerning for constipation/impaction.  11-20-20: ct of head:  1. Multifocal intracranial hemorrhage as detailed above with mildly decreased size of the dominant right frontal hemorrhage. No midline shift. 2. New small volume intraventricular hemorrhage. 3. Mildly increased size of low-density subdural fluid collection over the left cerebral convexity, possibly a traumatic hygroma. 4. Unchanged  subarachnoid hemorrhage.  NO NEW EXAMS.    LABS REVIEWED PREVIOUS   11-16-20: wbc 6.7; hgb 13.9; hct 43.1; mcv 90.2 plt 401; glucose 100; bun 15; creat 0.45; k+ 3.7; na++ 142; ca 8.7; GFR>60; liver normal albumin 3.5  11-28-20: wbc 9.7; hgb 13.6; hct 42.9; mcv 91.3 plt 625; glucose 91; bun 20; creat 0.38; k+ 3.9; na++ 142; ca 8.7 GFR>60; liver normal albumin 2.8   12-05-20: glucose 95; bun 24; creat 0.32; k+ 4.0; na++ 148; ca 9.0 GFR>60  TODAY  11-28-20: wbc 9.7; hgb 13.6; hct 42.9; mcv 91.3 plt 625; glucose 91; bun 20; creat 0.38; k+ 3.9; na++ 142; ca 8.7 GFR>60; liver normal albumin 2.8 12-05-20: glucose 97; bun 22; creat 0.35; k+ 3.5; na++ 149; ca 91 GFR>60  Review of Systems  Unable to perform ROS: Dementia (unable to participate )   Physical Exam Constitutional:      General: She is not in acute distress.    Appearance: She is well-developed. She is not diaphoretic.  Neck:     Thyroid: No thyromegaly.  Cardiovascular:     Rate and Rhythm: Normal rate and regular rhythm.     Pulses: Normal pulses.     Heart sounds: Normal heart sounds.     Comments: 2/6 Pulmonary:     Effort: Pulmonary effort is normal. No respiratory distress.     Breath sounds: Normal breath  sounds.  Abdominal:     General: Bowel sounds are normal. There is no distension.     Palpations: Abdomen is soft.     Tenderness: There is no abdominal tenderness.  Musculoskeletal:     Cervical back: Neck supple.     Right lower leg: No edema.     Left lower leg: No edema.     Comments: Has edema to left hand Left side flaccid Is able to move right extremities.  Neck is hyperextended  History of cervical and lumbar surgeries   Lymphadenopathy:   Lymphadenopathy:     Cervical: No cervical adenopathy.  Skin:    General: Skin is warm and dry.  Neurological:     Comments:  Is status post right frontal lobe hemorrhage: 3.5 x 4.8 x 3.7 cm     Is aware      ASSESSMENT/ PLAN:  TODAY  1. Moderate dementia with behavioral disturbance: she is losing weight; her current weigh it 110 pounds. Her aricept was lowered to 5 mg daily will monitor   2. Traumatic  hemorrhage of right cerebrum with loss of consciousness sequela: left side hemiplegia: is without change will monitor   3. Chronic obstructive pulmonary disease unspecified COPD type: is stable will continue albuterol 2 puffs every 6 hours as needed is on room air  4. Essential hypertension; benign: is stable b/p 148/88 will continue cardura 2 mg daily lisinopril 20 mg daily norvasc 5 mg daily    PREVIOUS   5. Urinary urgency: is without change: is without change is off medications  6. Major depression recurrent chronic: will continue lexapro 20 mg daily   7. Closed wedge compression fracture of T 11 vertebrae sequela (07/2020) will continue mobic 7.5 mg daily and will monitor    Synthia Innocent NP [facility]  Contact [phone] Monday through Friday 8am- 5pm  After hours call [phone]

## 2021-01-16 ENCOUNTER — Encounter: Payer: Self-pay | Admitting: Adult Health

## 2021-01-16 ENCOUNTER — Non-Acute Institutional Stay (SKILLED_NURSING_FACILITY): Payer: Medicare Other | Admitting: Adult Health

## 2021-01-16 DIAGNOSIS — S06349S Traumatic hemorrhage of right cerebrum with loss of consciousness of unspecified duration, sequela: Secondary | ICD-10-CM | POA: Diagnosis not present

## 2021-01-16 DIAGNOSIS — F0280 Dementia in other diseases classified elsewhere without behavioral disturbance: Secondary | ICD-10-CM

## 2021-01-16 DIAGNOSIS — G309 Alzheimer's disease, unspecified: Secondary | ICD-10-CM

## 2021-01-16 DIAGNOSIS — J449 Chronic obstructive pulmonary disease, unspecified: Secondary | ICD-10-CM | POA: Diagnosis not present

## 2021-01-16 DIAGNOSIS — G8194 Hemiplegia, unspecified affecting left nondominant side: Secondary | ICD-10-CM | POA: Diagnosis not present

## 2021-01-16 NOTE — Progress Notes (Signed)
Location:  [facility] Room Number: 157-W Place of Service:  SNF (31)   CODE STATUS: DNR  Allergies  Allergen Reactions  . Iohexol Anaphylaxis and Shortness Of Breath    PT STATES SHE WENT INTO ANAPHYLACTIC SHOCK 2YRS AGO AFTER CT WITH CONTRAST AT [place]:Onset Date: 10/10/2009   . Shellfish Allergy Anaphylaxis  . Morphine And Related Other (See Comments)    Unknown   . Penicillins Rash    Can tolerate cephalosporins    Chief Complaint  Patient presents with  . Acute Visit    Care plan meeting.    HPI:  We have come together for her care plan meeting   No BIMS/mood. She is dependent for her adl care. She is incontinent of bladder and bowel. Requires assist with meals. There have been no falls. Has hand splint daily. Therapy none dietary significant loss in April; weight is 111.9 pounds. Pureed diet   She continues to be followed for her chronic illnesses including: Left hemiplegia Chronic obstructive pulmonary disease unspecified COPD typeTraumatic hemorrhage of right cerebrum with loss of consciousness sequela  Alzheimer's disease    Is now being followed by hospice care   Past Medical History:  Diagnosis Date  . Ankle fracture, left 06/08/2014  . Anxiety   . Asthma   . Colon polyp   . COPD (chronic obstructive pulmonary disease) (HCC)   . Dementia (HCC)   . Depression   . Diverticular disease   . Hyperlipidemia   . Hypertension   . IFG (impaired fasting glucose)   . Lumbar burst fracture (HCC) 06/14/2014  . Memory loss   . Mild sleep apnea   . Multiple fractures of left foot 06/08/2014    Past Surgical History:  Procedure Laterality Date  . ABDOMINAL HYSTERECTOMY    . ABDOMINAL SURGERY    . ANTERIOR LAT LUMBAR FUSION Left 06/22/2014   Procedure: Left Lumbar one Corpectomy with Fusion Thoracic twelve-Lumbar two,lateral plating,posterior percutaneous pedicle screws;  Surgeon: Temple Pacini, MD;  Location: [facility];  Service:  Neurosurgery;  Laterality: Left;  . APPENDECTOMY    . BACK SURGERY    . CERVICAL FUSION  October 2007  . LUMBAR PERCUTANEOUS PEDICLE SCREW 1 LEVEL N/A 06/22/2014   Procedure: LUMBAR PERCUTANEOUS PEDICLE SCREW 1 LEVEL;  Surgeon: Temple Pacini, MD;  Location: [facility];  Service: Neurosurgery;  Laterality: N/A;  . NECK SURGERY    . ORIF TIBIA FRACTURE Left 06/04/2014   Procedure: OPEN REDUCTION INTERNAL FIXATION (ORIF) PILON FRACTURE AND OPEN REDUCTION INTERNAL FIXATION (ORIF) OF SYNDESMOTIC FRACTURE;  Surgeon: Eldred Manges, MD;  Location: [facility];  Service: Orthopedics;  Laterality: Left;  Big C-arm, Flat Jackson, Biomet Anterolateral plates, cancellous bone graft 20cc. Surgeon will call. Available after 5pm  . ORIF TIBIA FRACTURE Left 06/14/2014   DR Gerlene Fee  . SIGMOIDECTOMY    . TONSILLECTOMY      Social History   Socioeconomic History  . Marital status: Married    Spouse name: [name]  . Number of children: 3  . Years of education: GED  . Highest education level: Not on file  Occupational History    Comment: Retired  Tobacco Use  . Smoking status: Former Smoker    Packs/day: 0.50    Years: 32.00    Pack years: 16.00    Types: Cigarettes    Start date: 04/11/1962    Quit date: 03/10/1994    Years since quitting: 26.8  . Smokeless  tobacco: Never Used  Vaping Use  . Vaping Use: Never used  Substance and Sexual Activity  . Alcohol use: No    Alcohol/week: 0.0 standard drinks  . Drug use: No  . Sexual activity: Never  Other Topics Concern  . Not on file  Social History Narrative   Patient lives at home with her husband [name]). Patient is retired.    GED.   Caffeine- coffee- 1 cup daily   Right handed.   Social Determinants of Health   Financial Resource Strain: Not on file  Food Insecurity: Not on file  Transportation Needs: Not on file  Physical Activity: Not on file  Stress: Not on file  Social Connections: Not on file  Intimate Partner Violence: Not on file    Family History  Problem Relation Age of Onset  . Hypertension Father   . Diabetes Father   . Heart attack Father       VITAL SIGNS BP 134/79   Pulse 100   Temp 98 F (36.7 C)   Resp 20   Ht 5' (1.524 m)   Wt 111 lb 14.4 oz (50.8 kg)   SpO2 94%   BMI 21.85 kg/m   Outpatient Encounter Medications as of 01/16/2021  Medication Sig  . acetaminophen (TYLENOL) 500 MG tablet Take 1 tablet (500 mg total) by mouth every 6 (six) hours as needed for mild pain or fever. For thoracic fracture.  Marland Kitchen albuterol (PROAIR HFA) 108 (90 Base) MCG/ACT inhaler Inhale 2 puffs into the lungs every 6 (six) hours as needed. for wheezing  . amLODipine (NORVASC) 5 MG tablet Take 5 mg by mouth daily.  Lucilla Lame [place]-Castor Oil (VENELEX) OINT Apply topically. Special Instructions: Apply to sacrum, bilateral buttocks & coccyx qshift for prevention.  Marland Kitchen doxazosin (CARDURA) 2 MG tablet TAKE (1) TABLET BY MOUTH ONCE DAILY.  Marland Kitchen escitalopram (LEXAPRO) 20 MG tablet TAKE (1) TABLET BY MOUTH ONCE DAILY.  . feeding supplement (ENSURE ENLIVE / ENSURE PLUS) LIQD Take 237 mLs by mouth 2 (two) times daily between meals.  Marland Kitchen lisinopril (ZESTRIL) 20 MG tablet Take 20 mg by mouth daily.  . meloxicam (MOBIC) 7.5 MG tablet TAKE (1) TABLET BY MOUTH ONCE DAILY.  . NON FORMULARY Diet: Pureed, NAS Diet ALLERGIC TO SHELLFISH - ANAPHYLACTIC REACTION!!!  . [DISCONTINUED] donepezil (ARICEPT) 5 MG tablet Take 5 mg by mouth at bedtime.   No facility-administered encounter medications on file as of 01/16/2021.     SIGNIFICANT DIAGNOSTIC EXAMS  PREVIOUS   11-16-20: ct of head and cervical spine:  1. Multifocal subarachnoid hemorrhage correlating with trauma history. 2. ~3 cm superior right frontal parenchymal hemorrhage, presumed hemorrhagic contusion in this setting. 3. Age advanced brain atrophy. 4. Negative for cervical spine fracture.  11-16-20: pelvic x-ray:  1. No acute fracture or malalignment. 2. The bones are diffusely  demineralized suggesting underlying osteoporosis. This limits the sensitivity for detection of nondisplaced fractures by conventional x-ray. 3. Moderate amount of formed stool within the rectum concerning for constipation/impaction.  11-20-20: ct of head:  1. Multifocal intracranial hemorrhage as detailed above with mildly decreased size of the dominant right frontal hemorrhage. No midline shift. 2. New small volume intraventricular hemorrhage. 3. Mildly increased size of low-density subdural fluid collection over the left cerebral convexity, possibly a traumatic hygroma. 4. Unchanged subarachnoid hemorrhage.  NO NEW EXAMS.    LABS REVIEWED PREVIOUS   11-16-20: wbc 6.7; hgb 13.9; hct 43.1; mcv 90.2 plt 401; glucose 100; bun 15; creat 0.45; k+  3.7; na++ 142; ca 8.7; GFR>60; liver normal albumin 3.5  11-28-20: wbc 9.7; hgb 13.6; hct 42.9; mcv 91.3 plt 625; glucose 91; bun 20; creat 0.38; k+ 3.9; na++ 142; ca 8.7 GFR>60; liver normal albumin 2.8   12-05-20: glucose 95; bun 24; creat 0.32; k+ 4.0; na++ 148; ca 9.0 GFR>60 11-28-20: wbc 9.7; hgb 13.6; hct 42.9; mcv 91.3 plt 625; glucose 91; bun 20; creat 0.38; k+ 3.9; na++ 142; ca 8.7 GFR>60; liver normal albumin 2.8 12-05-20: glucose 97; bun 22; creat 0.35; k+ 3.5; na++ 149; ca 91 GFR>60  NO NEW LABS.   Review of Systems  Unable to perform ROS: Dementia (unable to participate )   Physical Exam Constitutional:      General: She is not in acute distress.    Appearance: She is well-developed. She is not diaphoretic.  Neck:     Thyroid: No thyromegaly.  Cardiovascular:     Rate and Rhythm: Normal rate and regular rhythm.     Pulses: Normal pulses.     Heart sounds: Normal heart sounds.  Pulmonary:     Effort: Pulmonary effort is normal. No respiratory distress.     Breath sounds: Normal breath sounds.  Abdominal:     General: Bowel sounds are normal. There is no distension.     Palpations: Abdomen is soft.     Tenderness: There is no abdominal  tenderness.  Musculoskeletal:     Cervical back: Neck supple.     Right lower leg: No edema.     Left lower leg: No edema.     Comments: Has edema to left hand Left side flaccid Is able to move right extremities.  Neck is hyperextended  History of cervical and lumbar surgeries   Lymphadenopathy:  Lymphadenopathy:     Cervical: No cervical adenopathy.  Skin:    General: Skin is warm and dry.  Neurological:     Mental Status: She is alert.     Comments: Is status post right frontal lobe hemorrhage: 3.5 x 4.8 x 3.7 cm     Is aware         ASSESSMENT/ PLAN:  TODAY  1. Left hemiplegia 2. Chronic obstructive pulmonary disease unspecified COPD type 3. Traumatic hemorrhage of right cerebrum with loss of consciousness sequela 4.  Alzheimer's disease  Will continue medications  Will continue current plan of care Will continue to monitor her status.   Time spent with patient: 40 minutes: goals of care; overall health status; medications    Synthia Innocent NP [facility]  Contact [phone] Monday through Friday 8am- 5pm  After hours call [phone]

## 2021-02-14 DEATH — deceased
